# Patient Record
Sex: Male | Born: 1945 | Race: White | Hispanic: No | Smoking: Former smoker
Health system: Southern US, Community
[De-identification: ages and names within clinical notes are randomized; demographics above are authoritative.]

## PROBLEM LIST (undated history)

## (undated) DIAGNOSIS — I2510 Atherosclerotic heart disease of native coronary artery without angina pectoris: Secondary | ICD-10-CM

## (undated) DIAGNOSIS — M109 Gout, unspecified: Secondary | ICD-10-CM

## (undated) DIAGNOSIS — I4891 Unspecified atrial fibrillation: Secondary | ICD-10-CM

## (undated) DIAGNOSIS — J90 Pleural effusion, not elsewhere classified: Secondary | ICD-10-CM

## (undated) DIAGNOSIS — E7800 Pure hypercholesterolemia, unspecified: Secondary | ICD-10-CM

## (undated) DIAGNOSIS — I509 Heart failure, unspecified: Secondary | ICD-10-CM

## (undated) DIAGNOSIS — I10 Essential (primary) hypertension: Secondary | ICD-10-CM

## (undated) DIAGNOSIS — E119 Type 2 diabetes mellitus without complications: Secondary | ICD-10-CM

## (undated) HISTORY — PX: PENILE PROSTHESIS IMPLANT: SHX240

## (undated) HISTORY — PX: OTHER SURGICAL HISTORY: SHX169

## (undated) HISTORY — DX: Pleural effusion, not elsewhere classified: J90

## (undated) HISTORY — PX: CORONARY ARTERY BYPASS GRAFT: SHX141

## (undated) HISTORY — PX: COLONOSCOPY: SHX174

## (undated) HISTORY — PX: CARDIAC CATHETERIZATION: SHX172

---

## 2011-09-27 ENCOUNTER — Ambulatory Visit: Payer: Self-pay | Admitting: Unknown Physician Specialty

## 2014-01-06 ENCOUNTER — Emergency Department: Payer: Self-pay | Admitting: Emergency Medicine

## 2014-01-06 LAB — CBC
HCT: 43.3 % (ref 40.0–52.0)
HGB: 15.3 g/dL (ref 13.0–18.0)
MCH: 33.2 pg (ref 26.0–34.0)
MCHC: 35.3 g/dL (ref 32.0–36.0)
MCV: 94 fL (ref 80–100)
Platelet: 239 10*3/uL (ref 150–440)
RBC: 4.61 10*6/uL (ref 4.40–5.90)
RDW: 12.7 % (ref 11.5–14.5)
WBC: 11.4 10*3/uL — ABNORMAL HIGH (ref 3.8–10.6)

## 2014-01-06 LAB — BASIC METABOLIC PANEL
Anion Gap: 6 — ABNORMAL LOW (ref 7–16)
BUN: 16 mg/dL (ref 7–18)
CALCIUM: 9.5 mg/dL (ref 8.5–10.1)
CHLORIDE: 101 mmol/L (ref 98–107)
Co2: 28 mmol/L (ref 21–32)
Creatinine: 1.57 mg/dL — ABNORMAL HIGH (ref 0.60–1.30)
EGFR (African American): 51 — ABNORMAL LOW
EGFR (Non-African Amer.): 44 — ABNORMAL LOW
Glucose: 347 mg/dL — ABNORMAL HIGH (ref 65–99)
Osmolality: 285 (ref 275–301)
Potassium: 3.6 mmol/L (ref 3.5–5.1)
Sodium: 135 mmol/L — ABNORMAL LOW (ref 136–145)

## 2014-01-06 LAB — TROPONIN I
Troponin-I: 0.03 ng/mL
Troponin-I: 0.03 ng/mL

## 2014-06-07 DIAGNOSIS — M109 Gout, unspecified: Secondary | ICD-10-CM | POA: Insufficient documentation

## 2014-06-07 DIAGNOSIS — I4891 Unspecified atrial fibrillation: Secondary | ICD-10-CM | POA: Insufficient documentation

## 2014-06-13 DIAGNOSIS — Z951 Presence of aortocoronary bypass graft: Secondary | ICD-10-CM | POA: Insufficient documentation

## 2014-10-14 DIAGNOSIS — E1129 Type 2 diabetes mellitus with other diabetic kidney complication: Secondary | ICD-10-CM | POA: Insufficient documentation

## 2014-10-14 DIAGNOSIS — E119 Type 2 diabetes mellitus without complications: Secondary | ICD-10-CM | POA: Insufficient documentation

## 2014-10-14 DIAGNOSIS — I10 Essential (primary) hypertension: Secondary | ICD-10-CM | POA: Insufficient documentation

## 2015-01-08 DIAGNOSIS — Z860101 Personal history of adenomatous and serrated colon polyps: Secondary | ICD-10-CM | POA: Insufficient documentation

## 2015-01-08 DIAGNOSIS — Z86010 Personal history of colonic polyps: Secondary | ICD-10-CM | POA: Insufficient documentation

## 2015-01-31 ENCOUNTER — Ambulatory Visit: Payer: Self-pay | Admitting: Unknown Physician Specialty

## 2015-02-12 DIAGNOSIS — E785 Hyperlipidemia, unspecified: Secondary | ICD-10-CM

## 2015-02-12 DIAGNOSIS — E1169 Type 2 diabetes mellitus with other specified complication: Secondary | ICD-10-CM | POA: Insufficient documentation

## 2015-02-12 DIAGNOSIS — R202 Paresthesia of skin: Secondary | ICD-10-CM | POA: Insufficient documentation

## 2015-03-24 LAB — SURGICAL PATHOLOGY

## 2015-10-21 DIAGNOSIS — I2510 Atherosclerotic heart disease of native coronary artery without angina pectoris: Secondary | ICD-10-CM | POA: Insufficient documentation

## 2016-02-24 ENCOUNTER — Ambulatory Visit (INDEPENDENT_AMBULATORY_CARE_PROVIDER_SITE_OTHER): Payer: Medicare Other | Admitting: Sports Medicine

## 2016-02-24 ENCOUNTER — Ambulatory Visit: Payer: Medicare Other

## 2016-02-24 ENCOUNTER — Encounter: Payer: Self-pay | Admitting: Sports Medicine

## 2016-02-24 DIAGNOSIS — R234 Changes in skin texture: Secondary | ICD-10-CM

## 2016-02-24 DIAGNOSIS — E1142 Type 2 diabetes mellitus with diabetic polyneuropathy: Secondary | ICD-10-CM

## 2016-02-24 DIAGNOSIS — M79672 Pain in left foot: Secondary | ICD-10-CM

## 2016-02-24 DIAGNOSIS — L989 Disorder of the skin and subcutaneous tissue, unspecified: Secondary | ICD-10-CM

## 2016-02-24 NOTE — Progress Notes (Signed)
Patient ID: Jeremiah Chapman, male   DOB: 03-14-1946, 71 y.o.   MRN: [id number] Subjective: Jeremiah Chapman is a 70 y.o. male patient with history of diabetes who presents to office today complaining of dry skin to both heels; report on left the area has opened up and is painful. Patient states that the glucose reading this morning was "good". Patient denies any new changes in medication or new problems. Patient denies any new cramping, numbness, burning or tingling in the legs. Has gabapentin for his nerve pain.  Patient Active Problem List   Diagnosis Date Noted  . Arteriosclerosis of coronary artery 10/21/2015  . HLD (hyperlipidemia) 02/12/2015  . Burning sensation of the foot 02/12/2015  . H/O adenomatous polyp of colon 01/08/2015  . Essential (primary) hypertension 10/14/2014  . Controlled type 2 diabetes mellitus without complication (HCC) 10/14/2014  . H/O coronary artery bypass surgery 06/13/2014  . Atrial fibrillation (HCC) 06/07/2014  . Gout 06/07/2014   No current outpatient prescriptions on file prior to visit.   No current facility-administered medications on file prior to visit.   Allergies  Allergen Reactions  . Sitagliptin Other (See Comments)    No results found for this or any previous visit (from the past 2160 hour(s)).  Objective: General: Patient is awake, alert, and oriented x 3 and in no acute distress.  Integument: Skin is warm, dry and supple bilateral. Nails are short and asymptomatic with dystrophy with subungual debris, consistent with onychomycosis, 1-5 bilateral. There is significant dry skin and fissuring with preulcerative area and bleeding at left lateral heel. No signs of infection. Remaining integument unremarkable.  Vasculature:  Dorsalis Pedis pulse 2/4 bilateral. Posterior Tibial pulse  1/4 bilateral.  Capillary fill time <3 sec 1-5 bilateral. Scant hair growth to the level of the digits. Temperature gradient within normal limits. No  varicosities present bilateral. No edema present bilateral.   Neurology: The patient has slightly diminished sensation measured with a 5.07/10g Semmes Weinstein Monofilament at all pedal sites bilateral. Vibratory sensation diminished bilateral with tuning fork. No Babinski sign present bilateral.   Musculoskeletal: Mild tenderness to palpation to left lateral heel fissure area. No gross pedal deformities noted bilateral. Muscular strength 5/5 in all lower extremity muscular groups bilateral without pain on range of motion . No tenderness with calf compression bilateral.  Xrays, Left foot- Normal osseous mineralization, inferior calcaneal spur, no foreign body, no acute bony destruction, no other acute findings.  Assessment and Plan: Problem List Items Addressed This Visit    None    Visit Diagnoses    Left foot pain    -  Primary    Relevant Orders    DG Foot Complete Left    Fissure in skin of foot        Left heel with preulcerative area    Diabetic polyneuropathy associated with type 2 diabetes mellitus (HCC)        Relevant Medications    amLODipine-olmesartan (AZOR) 5-40 MG tablet    atorvastatin (LIPITOR) 20 MG tablet    gabapentin (NEURONTIN) 100 MG capsule    gabapentin (NEURONTIN) 100 MG capsule    glyBURIDE-metformin (GLUCOVANCE) 5-500 MG tablet    linagliptin (TRADJENTA) 5 MG TABS tablet    pioglitazone (ACTOS) 45 MG tablet    pioglitazone (ACTOS) 30 MG tablet      -Examined patient. -Xrays reviewed -Discussed and educated patient on diabetic foot care, especially with  regards to the vascular, neurological and musculoskeletal systems.  -Stressed the importance  of good glycemic control and the detriment of not  controlling glucose levels in relation to the foot. -Mechanically debrided callus/fissured skin at left lateral heel using sterile chisel blade and applied topical antibiotic cream covered with bandaid and offloading pad. Advised patient to do the same at home  daily until improves. Patient may also soak with Epsom salt. Advised patient to monitor site closely if signs of infection is present to return to office or go to ER immediately -Advised patient to refrain from shoes that irritate or rub the area -Encouraged heel offloading in bed to prevent further break down -Encouraged daily skin emollients; Okeeffe's healthy feet to all other dry skin areas -Answered all patient questions -Patient to return as needed or in 1 month for at risk foot care -Patient advised to call the office if any problems or questions arise in the meantime.  Asencion Islamitorya Jewelia Bocchino, DPM

## 2016-02-24 NOTE — Patient Instructions (Addendum)
Diabetes and Foot Care Diabetes may cause you to have problems because of poor blood supply (circulation) to your feet and legs. This may cause the skin on your feet to become thinner, break easier, and heal more slowly. Your skin may become dry, and the skin may peel and crack. You may also have nerve damage in your legs and feet causing decreased feeling in them. You may not notice minor injuries to your feet that could lead to infections or more serious problems. Taking care of your feet is one of the most important things you can do for yourself.  HOME CARE INSTRUCTIONS  Wear shoes at all times, even in the house. Do not go barefoot. Bare feet are easily injured.  Check your feet daily for blisters, cuts, and redness. If you cannot see the bottom of your feet, use a mirror or ask someone for help.  Wash your feet with warm water (do not use hot water) and mild soap. Then pat your feet and the areas between your toes until they are completely dry. Do not soak your feet as this can dry your skin.  Apply a moisturizing lotion or petroleum jelly (that does not contain alcohol and is unscented) to the skin on your feet and to dry, brittle toenails. Do not apply lotion between your toes.  Trim your toenails straight across. Do not dig under them or around the cuticle. File the edges of your nails with an emery board or nail file.  Do not cut corns or calluses or try to remove them with medicine.  Wear clean socks or stockings every day. Make sure they are not too tight. Do not wear knee-high stockings since they may decrease blood flow to your legs.  Wear shoes that fit properly and have enough cushioning. To break in new shoes, wear them for just a few hours a day. This prevents you from injuring your feet. Always look in your shoes before you put them on to be sure there are no objects inside.  Do not cross your legs. This may decrease the blood flow to your feet.  If you find a minor scrape,  cut, or break in the skin on your feet, keep it and the skin around it clean and dry. These areas may be cleansed with mild soap and water. Do not cleanse the area with peroxide, alcohol, or iodine.  When you remove an adhesive bandage, be sure not to damage the skin around it.  If you have a wound, look at it several times a day to make sure it is healing.  Do not use heating pads or hot water bottles. They may burn your skin. If you have lost feeling in your feet or legs, you may not know it is happening until it is too late.  Make sure your health care provider performs a complete foot exam at least annually or more often if you have foot problems. Report any cuts, sores, or bruises to your health care provider immediately. SEEK MEDICAL CARE IF:   You have an injury that is not healing.  You have cuts or breaks in the skin.  You have an ingrown nail.  You notice redness on your legs or feet.  You feel burning or tingling in your legs or feet.  You have pain or cramps in your legs and feet.  Your legs or feet are numb.  Your feet always feel cold. SEEK IMMEDIATE MEDICAL CARE IF:   There is increasing redness,   swelling, or pain in or around a wound.  There is a red line that goes up your leg.  Pus is coming from a wound.  You develop a fever or as directed by your health care provider.  You notice a bad smell coming from an ulcer or wound.   This information is not intended to replace advice given to you by your health care provider. Make sure you discuss any questions you have with your health care provider.   Document Released: 11/12/2000 Document Revised: 07/18/2013 Document Reviewed: 04/24/2013 Elsevier Interactive Patient Education 2016 [employer]   Recommend Epsom salt soaks and neosporin and bandaid to left heel  May use bag balm or Okeeffee's healthy feet for dry skin at heels

## 2016-03-23 ENCOUNTER — Ambulatory Visit: Payer: Medicare Other | Admitting: Sports Medicine

## 2016-03-26 ENCOUNTER — Ambulatory Visit (INDEPENDENT_AMBULATORY_CARE_PROVIDER_SITE_OTHER): Payer: Medicare Other | Admitting: Sports Medicine

## 2016-03-26 ENCOUNTER — Encounter: Payer: Self-pay | Admitting: Sports Medicine

## 2016-03-26 DIAGNOSIS — M79672 Pain in left foot: Secondary | ICD-10-CM

## 2016-03-26 DIAGNOSIS — L989 Disorder of the skin and subcutaneous tissue, unspecified: Secondary | ICD-10-CM | POA: Diagnosis not present

## 2016-03-26 DIAGNOSIS — R234 Changes in skin texture: Secondary | ICD-10-CM

## 2016-03-26 DIAGNOSIS — E1142 Type 2 diabetes mellitus with diabetic polyneuropathy: Secondary | ICD-10-CM | POA: Diagnosis not present

## 2016-03-26 NOTE — Progress Notes (Signed)
Patient ID: SARGENT MANKEY, male   DOB: 1946-08-29, 70 y.o.   MRN: [id number]  Subjective: Jeremiah Chapman is a 70 y.o. male patient with history of diabetes who returns to office today for f/u eval left heel fissure; states that if feels much better. States his glucose reading this morning was "good". Patient denies any new changes in medication or new problems. Patient denies any new cramping, numbness, burning or tingling in the legs.  Patient Active Problem List   Diagnosis Date Noted  . Arteriosclerosis of coronary artery 10/21/2015  . HLD (hyperlipidemia) 02/12/2015  . Burning sensation of the foot 02/12/2015  . H/O adenomatous polyp of colon 01/08/2015  . Essential (primary) hypertension 10/14/2014  . Controlled type 2 diabetes mellitus without complication (HCC) 10/14/2014  . H/O coronary artery bypass surgery 06/13/2014  . Atrial fibrillation (HCC) 06/07/2014  . Gout 06/07/2014   Current Outpatient Prescriptions on File Prior to Visit  Medication Sig Dispense Refill  . amLODipine-olmesartan (AZOR) 5-40 MG tablet   1  . apixaban (ELIQUIS) 2.5 MG TABS tablet Take by mouth.    Marland Kitchen atorvastatin (LIPITOR) 20 MG tablet     . dronedarone (MULTAQ) 400 MG tablet Take by mouth.    Everlene Balls 5 MG TABS tablet     . gabapentin (NEURONTIN) 100 MG capsule Take by mouth.    . gabapentin (NEURONTIN) 100 MG capsule   0  . glyBURIDE-metformin (GLUCOVANCE) 5-500 MG tablet Take by mouth.    Marland Kitchen HYDROcodone-acetaminophen (NORCO/VICODIN) 5-325 MG tablet     . Influenza Vac Split High-Dose (FLUZONE HIGH-DOSE) 0.5 ML SUSY     . linagliptin (TRADJENTA) 5 MG TABS tablet Take by mouth.    . metoprolol succinate (TOPROL-XL) 50 MG 24 hr tablet Take by mouth.    . pioglitazone (ACTOS) 30 MG tablet   0  . pioglitazone (ACTOS) 45 MG tablet Take by mouth.    . pneumococcal 13-valent conjugate vaccine (PREVNAR 13) SUSP injection     . tretinoin (ALTRALIN) 0.05 % gel      No current facility-administered  medications on file prior to visit.   Allergies  Allergen Reactions  . Sitagliptin Other (See Comments)    No results found for this or any previous visit (from the past 2160 hour(s)).  Objective: General: Patient is awake, alert, and oriented x 3 and in no acute distress.  Integument: Skin is warm, dry and supple bilateral. Nails are short and asymptomatic with dystrophy with subungual debris, consistent with onychomycosis, 1-5 bilateral. There is significant dry skin and fissuring with preulcerative area at left lateral heel, that is improving in nature. No signs of infection. Remaining integument unremarkable.  Vasculature:  Dorsalis Pedis pulse 2/4 bilateral. Posterior Tibial pulse  1/4 bilateral.  Capillary fill time <3 sec 1-5 bilateral. Scant hair growth to the level of the digits. Temperature gradient within normal limits. No varicosities present bilateral. No edema present bilateral.   Neurology: The patient has slightly diminished sensation measured with a 5.07/10g Semmes Weinstein Monofilament at all pedal sites bilateral. Vibratory sensation diminished bilateral with tuning fork. No Babinski sign present bilateral.   Musculoskeletal: No tenderness to palpation to left lateral heel fissure area. No gross pedal deformities noted bilateral. Muscular strength 5/5 in all lower extremity muscular groups bilateral without pain on range of motion . No tenderness with calf compression bilateral.  Assessment and Plan: Problem List Items Addressed This Visit    None    Visit Diagnoses  Left foot pain    -  Primary    Fissure in skin of foot        Left lateral heel, improving    Diabetic polyneuropathy associated with type 2 diabetes mellitus (HCC)          -Examined patient. -Discussed and educated patient on diabetic foot care, especially with  regards to the vascular, neurological and musculoskeletal systems.  -Stressed the importance of good glycemic control and the  detriment of not  controlling glucose levels in relation to the foot. -Mechanically debrided fissured skin at left lateral heel using sterile chisel blade and applied topical antibiotic cream. Advised patient to do the same at home daily until completely resolved. Advised patient to monitor site closely if signs of infection is present to return to office or go to ER immediately -Advised patient to cont to refrain from shoes that irritate or rub the area -Encouraged heel offloading in bed to prevent further break down as previous -Encouraged daily skin emollients; Cont with Okeeffe's healthy feet to all other dry skin areas -Answered all patient questions -Patient to return as needed or in 1 month for at risk foot care will re-eval fissure and trim nails at next visit. -Patient advised to call the office if any problems or questions arise in the meantime.  Asencion Islamitorya Briceida Rasberry, DPM

## 2016-04-15 DIAGNOSIS — R0602 Shortness of breath: Secondary | ICD-10-CM | POA: Insufficient documentation

## 2016-05-04 ENCOUNTER — Ambulatory Visit (INDEPENDENT_AMBULATORY_CARE_PROVIDER_SITE_OTHER): Payer: Medicare Other | Admitting: Sports Medicine

## 2016-05-04 ENCOUNTER — Encounter: Payer: Self-pay | Admitting: Sports Medicine

## 2016-05-04 DIAGNOSIS — E1142 Type 2 diabetes mellitus with diabetic polyneuropathy: Secondary | ICD-10-CM | POA: Diagnosis not present

## 2016-05-04 DIAGNOSIS — M79671 Pain in right foot: Secondary | ICD-10-CM | POA: Diagnosis not present

## 2016-05-04 DIAGNOSIS — L989 Disorder of the skin and subcutaneous tissue, unspecified: Secondary | ICD-10-CM

## 2016-05-04 DIAGNOSIS — I739 Peripheral vascular disease, unspecified: Secondary | ICD-10-CM

## 2016-05-04 DIAGNOSIS — M79672 Pain in left foot: Secondary | ICD-10-CM

## 2016-05-04 DIAGNOSIS — B351 Tinea unguium: Secondary | ICD-10-CM | POA: Diagnosis not present

## 2016-05-04 DIAGNOSIS — R234 Changes in skin texture: Secondary | ICD-10-CM

## 2016-05-04 NOTE — Progress Notes (Signed)
Patient ID: Jeremiah MoralesKenneth D Chapman, male   DOB: 05/22/1946, 70 y.o.   MRN: [id number]  Subjective: Jeremiah MoralesKenneth D Chapman is a 70 y.o. male patient with history of diabetes who returns to office today for nail care and f/u eval heel fissure; states that if feels much better, continues with mositurizing creams with no problems. States his glucose reading this morning was "good". Patient denies any new changes in medication or new problems. Patient denies any new cramping, numbness, burning or tingling in the legs.  Patient Active Problem List   Diagnosis Date Noted  . Breathlessness on exertion 04/15/2016  . Arteriosclerosis of coronary artery 10/21/2015  . HLD (hyperlipidemia) 02/12/2015  . Burning sensation of the foot 02/12/2015  . H/O adenomatous polyp of colon 01/08/2015  . Essential (primary) hypertension 10/14/2014  . Controlled type 2 diabetes mellitus without complication (HCC) 10/14/2014  . H/O coronary artery bypass surgery 06/13/2014  . Atrial fibrillation (HCC) 06/07/2014  . Gout 06/07/2014   Current Outpatient Prescriptions on File Prior to Visit  Medication Sig Dispense Refill  . amLODipine-olmesartan (AZOR) 5-40 MG tablet   1  . apixaban (ELIQUIS) 2.5 MG TABS tablet Take by mouth.    Marland Kitchen. atorvastatin (LIPITOR) 20 MG tablet     . dronedarone (MULTAQ) 400 MG tablet Take by mouth.    Everlene Balls. ELIQUIS 5 MG TABS tablet     . gabapentin (NEURONTIN) 100 MG capsule Take by mouth.    . gabapentin (NEURONTIN) 100 MG capsule   0  . glyBURIDE-metformin (GLUCOVANCE) 5-500 MG tablet Take by mouth.    Marland Kitchen. HYDROcodone-acetaminophen (NORCO/VICODIN) 5-325 MG tablet     . Influenza Vac Split High-Dose (FLUZONE HIGH-DOSE) 0.5 ML SUSY     . linagliptin (TRADJENTA) 5 MG TABS tablet Take by mouth.    . metoprolol succinate (TOPROL-XL) 50 MG 24 hr tablet Take by mouth.    . pioglitazone (ACTOS) 30 MG tablet   0  . pioglitazone (ACTOS) 45 MG tablet Take by mouth.    . pneumococcal 13-valent conjugate vaccine (PREVNAR  13) SUSP injection     . tretinoin (ALTRALIN) 0.05 % gel      No current facility-administered medications on file prior to visit.   Allergies  Allergen Reactions  . Sitagliptin Other (See Comments)    No results found for this or any previous visit (from the past 2160 hour(s)).  Objective: General: Patient is awake, alert, and oriented x 3 and in no acute distress.  Integument: Skin is warm, dry and supple bilateral. Nails are tender and elongated with dystrophy with subungual debris, consistent with onychomycosis, 1-5 bilateral. There isdry skin and fissuring with preulcerative area at left>right lateral heel, that is improved in nature. No signs of infection. Remaining integument unremarkable.  Vasculature:  Dorsalis Pedis pulse 2/4 bilateral. Posterior Tibial pulse  1/4 bilateral.  Capillary fill time <3 sec 1-5 bilateral. Scant hair growth to the level of the digits. Temperature gradient within normal limits. + varicosities present bilateral. No edema present bilateral.   Neurology: The patient has slightly diminished sensation measured with a 5.07/10g Semmes Weinstein Monofilament at all pedal sites bilateral. Vibratory sensation diminished bilateral with tuning fork. No Babinski sign present bilateral.   Musculoskeletal: No tenderness to palpation to left lateral heel fissure area. No gross pedal deformities noted bilateral. Muscular strength 5/5 in all lower extremity muscular groups bilateral without pain on range of motion . No tenderness with calf compression bilateral.  Assessment and Plan: Problem List Items Addressed This  Visit    None    Visit Diagnoses    Dermatophytosis of nail    -  Primary    Diabetic polyneuropathy associated with type 2 diabetes mellitus (HCC)        Left foot pain        Right foot pain        Fissure in skin of foot        PVD (peripheral vascular disease) (HCC)          -Examined patient. -Discussed and educated patient on diabetic foot  care, especially with  regards to the vascular, neurological and musculoskeletal systems.  -Stressed the importance of good glycemic control and the detriment of not  controlling glucose levels in relation to the foot. -Mechanically debrided nails x 10 using sterile nail nipper without incident -Advised patient to cont to refrain from shoes that irritate or rub the area of heels -Encouraged daily skin emollients; Cont with Okeeffe's healthy feet to all other dry skin areas -Answered all patient questions -Patient to return in 10 weeks for at risk foot care/nail care -Patient advised to call the office if any problems or questions arise in the meantime.  Asencion Islam, DPM

## 2016-07-13 ENCOUNTER — Encounter: Payer: Self-pay | Admitting: Sports Medicine

## 2016-07-13 ENCOUNTER — Ambulatory Visit (INDEPENDENT_AMBULATORY_CARE_PROVIDER_SITE_OTHER): Payer: Medicare Other | Admitting: Sports Medicine

## 2016-07-13 DIAGNOSIS — B351 Tinea unguium: Secondary | ICD-10-CM | POA: Diagnosis not present

## 2016-07-13 DIAGNOSIS — E1142 Type 2 diabetes mellitus with diabetic polyneuropathy: Secondary | ICD-10-CM

## 2016-07-13 DIAGNOSIS — M79671 Pain in right foot: Secondary | ICD-10-CM | POA: Diagnosis not present

## 2016-07-13 DIAGNOSIS — B359 Dermatophytosis, unspecified: Secondary | ICD-10-CM

## 2016-07-13 DIAGNOSIS — M79672 Pain in left foot: Secondary | ICD-10-CM

## 2016-07-13 NOTE — Progress Notes (Signed)
Patient ID: Jeremiah MoralesKenneth D Chapman, male   DOB: 09/22/1946, 70 y.o.   MRN: [id number]  Subjective: Jeremiah MoralesKenneth D Chapman is a 70 y.o. male patient with history of diabetes who returns to office today for nail care. Admits to med change started on insulin injection. States his glucose reading this morning was "good". Patient denies any new changes in medication or other new problems. Patient denies any new cramping, numbness, burning or tingling in the legs.  Patient Active Problem List   Diagnosis Date Noted  . Breathlessness on exertion 04/15/2016  . Arteriosclerosis of coronary artery 10/21/2015  . HLD (hyperlipidemia) 02/12/2015  . Burning sensation of the foot 02/12/2015  . H/O adenomatous polyp of colon 01/08/2015  . Essential (primary) hypertension 10/14/2014  . Controlled type 2 diabetes mellitus without complication (HCC) 10/14/2014  . H/O coronary artery bypass surgery 06/13/2014  . Atrial fibrillation (HCC) 06/07/2014  . Gout 06/07/2014   Current Outpatient Prescriptions on File Prior to Visit  Medication Sig Dispense Refill  . amLODipine-olmesartan (AZOR) 5-40 MG tablet   1  . apixaban (ELIQUIS) 2.5 MG TABS tablet Take by mouth.    Marland Kitchen. atorvastatin (LIPITOR) 20 MG tablet     . dronedarone (MULTAQ) 400 MG tablet Take by mouth.    Everlene Balls. ELIQUIS 5 MG TABS tablet     . gabapentin (NEURONTIN) 100 MG capsule Take by mouth.    . gabapentin (NEURONTIN) 100 MG capsule   0  . glyBURIDE-metformin (GLUCOVANCE) 5-500 MG tablet Take by mouth.    Marland Kitchen. HYDROcodone-acetaminophen (NORCO/VICODIN) 5-325 MG tablet     . Influenza Vac Split High-Dose (FLUZONE HIGH-DOSE) 0.5 ML SUSY     . linagliptin (TRADJENTA) 5 MG TABS tablet Take by mouth.    . metoprolol succinate (TOPROL-XL) 50 MG 24 hr tablet Take by mouth.    . pioglitazone (ACTOS) 30 MG tablet   0  . pioglitazone (ACTOS) 45 MG tablet Take by mouth.    . pneumococcal 13-valent conjugate vaccine (PREVNAR 13) SUSP injection     . tretinoin (ALTRALIN) 0.05 %  gel      No current facility-administered medications on file prior to visit.    Allergies  Allergen Reactions  . Sitagliptin Other (See Comments)    No results found for this or any previous visit (from the past 2160 hour(s)).  Objective: General: Patient is awake, alert, and oriented x 3 and in no acute distress.  Integument: Skin is warm, dry and supple bilateral. Nails are tender and elongated with dystrophy with subungual debris, consistent with onychomycosis, 1-5 bilateral. There is dry skin and fissuring with preulcerative area at left>right lateral heel, that is improved in nature. No signs of infection. Mild interdigital maceration bilateral. No local acute infection to webspaces. Remaining integument unremarkable.  Vasculature:  Dorsalis Pedis pulse 2/4 bilateral. Posterior Tibial pulse  1/4 bilateral. Capillary fill time <3 sec 1-5 bilateral. Scant hair growth to the level of the digits.Temperature gradient within normal limits. + varicosities present bilateral. No edema present bilateral.   Neurology: The patient has slightly diminished sensation measured with a 5.07/10g Semmes Weinstein Monofilament at all pedal sites bilateral. Vibratory sensation diminished bilateral with tuning fork. No Babinski sign present bilateral.   Musculoskeletal: No tenderness to palpation to heel fissure areas. No gross pedal deformities noted bilateral. Muscular strength 5/5 in all lower extremity muscular groups bilateral without pain on range of motion . No tenderness with calf compression bilateral.  Assessment and Plan: Problem List Items Addressed This Visit  None    Visit Diagnoses    Dermatophytosis of nail    -  Primary   Tinea       Diabetic polyneuropathy associated with type 2 diabetes mellitus (HCC)       Foot pain, bilateral         -Examined patient. -Discussed and educated patient on diabetic foot care, especially with  regards to the vascular, neurological and  musculoskeletal systems.  -Stressed the importance of good glycemic control and the detriment of not controlling glucose levels in relation to the foot. -Mechanically debrided nails x 10 using sterile nail nipper without incident -Advised patient to cont to refrain from shoes that irritate or rub the area of heels -Encouraged daily skin emollients; Cont with Okeeffe's healthy feet to all other dry skin areas -Recommend to dry well in between toes; applied castanelli's paint to areas of tinea. If fail to improve will prescribe clotrimazole solution to use daily in between toes -Answered all patient questions -Patient to return in 10 weeks for at risk foot care/nail care -Patient advised to call the office if any problems or questions arise in the meantime.  Asencion Islamitorya Berdina Cheever, DPM

## 2016-09-21 ENCOUNTER — Ambulatory Visit (INDEPENDENT_AMBULATORY_CARE_PROVIDER_SITE_OTHER): Payer: Medicare Other | Admitting: Podiatry

## 2016-09-21 ENCOUNTER — Encounter: Payer: Self-pay | Admitting: Podiatry

## 2016-09-21 DIAGNOSIS — L603 Nail dystrophy: Secondary | ICD-10-CM

## 2016-09-21 DIAGNOSIS — M79609 Pain in unspecified limb: Principal | ICD-10-CM

## 2016-09-21 DIAGNOSIS — L608 Other nail disorders: Secondary | ICD-10-CM

## 2016-09-21 DIAGNOSIS — B351 Tinea unguium: Secondary | ICD-10-CM

## 2016-09-21 DIAGNOSIS — M79676 Pain in unspecified toe(s): Secondary | ICD-10-CM | POA: Diagnosis not present

## 2016-09-21 DIAGNOSIS — E0843 Diabetes mellitus due to underlying condition with diabetic autonomic (poly)neuropathy: Secondary | ICD-10-CM

## 2016-09-21 NOTE — Progress Notes (Signed)
SUBJECTIVE Patient with a history of diabetes mellitus presents to office today complaining of elongated, thickened nails. Pain while ambulating in shoes. Patient is unable to trim their own nails.   Allergies  Allergen Reactions  . Sitagliptin Other (See Comments)    OBJECTIVE General Patient is awake, alert, and oriented x 3 and in no acute distress. Derm Skin is dry and supple bilateral. Negative open lesions or macerations. Remaining integument unremarkable. Nails are tender, long, thickened and dystrophic with subungual debris, consistent with onychomycosis, 1-5 bilateral. No signs of infection noted. Vasc  DP and PT pedal pulses palpable bilaterally. Temperature gradient within normal limits.  Neuro Epicritic and protective threshold sensation diminished bilaterally.  Musculoskeletal Exam No symptomatic pedal deformities noted bilateral. Muscular strength within normal limits.  ASSESSMENT 1. Diabetes Mellitus w/ peripheral neuropathy 2. Onychomycosis of nail due to dermatophyte bilateral 3. Pain in foot bilateral  PLAN OF CARE 1. Patient evaluated today. 2. Instructed to maintain good pedal hygiene and foot care. Stressed importance of controlling blood sugar.  3. Mechanical debridement of nails 1-5 bilaterally performed using a nail nipper. Filed with dremel without incident.  4. Return to clinic in 3 mos.    Felecia ShellingBrent M Artem Bunte, DPM

## 2016-12-27 ENCOUNTER — Ambulatory Visit (INDEPENDENT_AMBULATORY_CARE_PROVIDER_SITE_OTHER): Payer: Medicare Other | Admitting: Podiatry

## 2016-12-27 ENCOUNTER — Encounter: Payer: Self-pay | Admitting: Podiatry

## 2016-12-27 DIAGNOSIS — B351 Tinea unguium: Secondary | ICD-10-CM

## 2016-12-27 DIAGNOSIS — M2010 Hallux valgus (acquired), unspecified foot: Secondary | ICD-10-CM

## 2016-12-27 DIAGNOSIS — E0843 Diabetes mellitus due to underlying condition with diabetic autonomic (poly)neuropathy: Secondary | ICD-10-CM

## 2016-12-27 DIAGNOSIS — M79609 Pain in unspecified limb: Secondary | ICD-10-CM

## 2016-12-27 NOTE — Progress Notes (Addendum)
Complaint:  Visit Type: Patient returns to my office for continued preventative foot care services. Complaint: Patient states" my nails have grown long and thick and become painful to walk and wear shoes" Patient has been diagnosed with DM with no foot complications. The patient presents for preventative foot care services. No changes to ROS  Podiatric Exam: Vascular: dorsalis pedis and posterior tibial pulses are palpable bilateral. Capillary return is immediate. Temperature gradient is WNL. Skin turgor WNL  Sensorium: Diminished  Semmes Weinstein monofilament test. Normal tactile sensation bilaterally. Nail Exam: Pt has thick disfigured discolored nails with subungual debris noted bilateral entire nail hallux through fifth toenails Ulcer Exam: There is no evidence of ulcer or pre-ulcerative changes or infection. Orthopedic Exam: Muscle tone and strength are WNL. No limitations in general ROM. No crepitus or effusions noted. Foot type and digits show no abnormalities. HAV   B/L Skin: No Porokeratosis. No infection or ulcers  Diagnosis:  Onychomycosis, , Pain in right toe, pain in left toes  Treatment & Plan Procedures and Treatment: Consent by patient was obtained for treatment procedures. The patient understood the discussion of treatment and procedures well. All questions were answered thoroughly reviewed. Debridement of mycotic and hypertrophic toenails, 1 through 5 bilateral and clearing of subungual debris. No ulceration, no infection noted. Patient to think about diabetic shoes. Return Visit-Office Procedure: Patient instructed to return to the office for a follow up visit 3 months for continued evaluation and treatment.    Helane GuntherGregory Carlene Bickley DPM

## 2016-12-28 ENCOUNTER — Ambulatory Visit: Payer: Medicare Other | Admitting: Podiatry

## 2017-01-20 ENCOUNTER — Encounter: Payer: Self-pay | Admitting: *Deleted

## 2017-01-20 ENCOUNTER — Ambulatory Visit
Admission: RE | Admit: 2017-01-20 | Discharge: 2017-01-20 | Disposition: A | Discharge status: Home / Self Care | Payer: Medicare Other | Source: Ambulatory Visit | Attending: Cardiology | Admitting: Cardiology

## 2017-01-20 ENCOUNTER — Ambulatory Visit: Payer: Medicare Other | Admitting: Anesthesiology

## 2017-01-20 ENCOUNTER — Encounter
Admission: RE | Disposition: A | Discharge status: Home / Self Care | Payer: Self-pay | Source: Ambulatory Visit | Attending: Cardiology

## 2017-01-20 DIAGNOSIS — Z6830 Body mass index (BMI) 30.0-30.9, adult: Secondary | ICD-10-CM | POA: Diagnosis not present

## 2017-01-20 DIAGNOSIS — M109 Gout, unspecified: Secondary | ICD-10-CM | POA: Insufficient documentation

## 2017-01-20 DIAGNOSIS — E669 Obesity, unspecified: Secondary | ICD-10-CM | POA: Insufficient documentation

## 2017-01-20 DIAGNOSIS — J449 Chronic obstructive pulmonary disease, unspecified: Secondary | ICD-10-CM | POA: Diagnosis not present

## 2017-01-20 DIAGNOSIS — I509 Heart failure, unspecified: Secondary | ICD-10-CM | POA: Insufficient documentation

## 2017-01-20 DIAGNOSIS — F17200 Nicotine dependence, unspecified, uncomplicated: Secondary | ICD-10-CM | POA: Diagnosis not present

## 2017-01-20 DIAGNOSIS — G4730 Sleep apnea, unspecified: Secondary | ICD-10-CM | POA: Insufficient documentation

## 2017-01-20 DIAGNOSIS — I110 Hypertensive heart disease with heart failure: Secondary | ICD-10-CM | POA: Diagnosis not present

## 2017-01-20 DIAGNOSIS — I4891 Unspecified atrial fibrillation: Secondary | ICD-10-CM | POA: Insufficient documentation

## 2017-01-20 DIAGNOSIS — E119 Type 2 diabetes mellitus without complications: Secondary | ICD-10-CM | POA: Diagnosis not present

## 2017-01-20 DIAGNOSIS — E785 Hyperlipidemia, unspecified: Secondary | ICD-10-CM | POA: Diagnosis not present

## 2017-01-20 DIAGNOSIS — I2510 Atherosclerotic heart disease of native coronary artery without angina pectoris: Secondary | ICD-10-CM | POA: Diagnosis not present

## 2017-01-20 HISTORY — DX: Type 2 diabetes mellitus without complications: E11.9

## 2017-01-20 HISTORY — DX: Unspecified atrial fibrillation: I48.91

## 2017-01-20 HISTORY — DX: Pure hypercholesterolemia, unspecified: E78.00

## 2017-01-20 HISTORY — PX: CARDIOVERSION: EP1203

## 2017-01-20 HISTORY — DX: Essential (primary) hypertension: I10

## 2017-01-20 HISTORY — DX: Gout, unspecified: M10.9

## 2017-01-20 HISTORY — DX: Heart failure, unspecified: I50.9

## 2017-01-20 SURGERY — CARDIOVERSION (CATH LAB)
Anesthesia: General

## 2017-01-20 MED ORDER — SODIUM CHLORIDE 0.9 % IV SOLN
INTRAVENOUS | Status: DC
  Administered 2017-01-20: 07:00:00 via INTRAVENOUS

## 2017-01-20 MED ORDER — PROPOFOL 10 MG/ML IV BOLUS
INTRAVENOUS | Status: AC
  Filled 2017-01-20: qty 20

## 2017-01-20 MED ORDER — PROPOFOL 10 MG/ML IV BOLUS
INTRAVENOUS | Status: DC | PRN
  Administered 2017-01-20: 10 mg via INTRAVENOUS
  Administered 2017-01-20: 40 mg via INTRAVENOUS
  Administered 2017-01-20: 20 mg via INTRAVENOUS
  Administered 2017-01-20: 10 mg via INTRAVENOUS

## 2017-01-20 NOTE — Anesthesia Post-op Follow-up Note (Cosign Needed)
Anesthesia QCDR form completed.        

## 2017-01-20 NOTE — Anesthesia Preprocedure Evaluation (Addendum)
Anesthesia Evaluation  Patient identified by MRN, date of birth, ID band Patient awake    Reviewed: Allergy & Precautions, NPO status , Patient's Chart, lab work & pertinent test results  History of Anesthesia Complications Negative for: history of anesthetic complications  Airway Mallampati: IV  TM Distance: >3 FB Neck ROM: Full    Dental no notable dental hx.    Pulmonary sleep apnea , COPD, Current Smoker,    breath sounds clear to auscultation- rhonchi (-) wheezing      Cardiovascular hypertension, (-) angina+ CAD, + CABG (1998) and +CHF  + dysrhythmias Atrial Fibrillation  Rhythm:Regular Rate:Normal - Systolic murmurs and - Diastolic murmurs Echo 05/03/16: MILD LV SYSTOLIC DYSFUNCTION WITH MODERATE LVH MODERATE VALVULAR REGURGITATION (Mild MR, Mild AI, Mod PR) NO VALVULAR STENOSIS MILD TO MODERATE AI EF 45%   Neuro/Psych negative neurological ROS  negative psych ROS   GI/Hepatic negative GI ROS, Neg liver ROS,   Endo/Other  diabetes  Renal/GU Renal InsufficiencyRenal disease     Musculoskeletal negative musculoskeletal ROS (+)   Abdominal (+) + obese,   Peds  Hematology negative hematology ROS (+)   Anesthesia Other Findings Past Medical History: No date: Atrial fibrillation (HCC) No date: CHF (congestive heart failure) (HCC) No date: Diabetes mellitus without complication (HCC) No date: Gout No date: Hypercholesterolemia No date: Hypertension   Reproductive/Obstetrics                            Anesthesia Physical Anesthesia Plan  ASA: IV  Anesthesia Plan: General   Post-op Pain Management:    Induction: Intravenous  Airway Management Planned: Natural Airway  Additional Equipment:   Intra-op Plan:   Post-operative Plan:   Informed Consent: I have reviewed the patients History and Physical, chart, labs and discussed the procedure including the risks, benefits and  alternatives for the proposed anesthesia with the patient or authorized representative who has indicated his/her understanding and acceptance.   Dental advisory given  Plan Discussed with: CRNA and Anesthesiologist  Anesthesia Plan Comments:        Anesthesia Quick Evaluation

## 2017-01-20 NOTE — Anesthesia Postprocedure Evaluation (Signed)
Anesthesia Post Note  Patient: Jeremiah MoralesKenneth D Chapman  Procedure(s) Performed: Procedure(s) (LRB): CARDIOVERSION (N/A)  Patient location during evaluation: Other Anesthesia Type: General Level of consciousness: awake and alert and oriented Pain management: pain level controlled Vital Signs Assessment: post-procedure vital signs reviewed and stable Respiratory status: spontaneous breathing, nonlabored ventilation and respiratory function stable Cardiovascular status: blood pressure returned to baseline and stable Postop Assessment: no signs of nausea or vomiting Anesthetic complications: no     Last Vitals:  Vitals:   01/20/17 0702 01/20/17 0730  BP: 131/68 126/64  Pulse: 78 (!) 58  Resp: 18 (!) 24  Temp: 37 C     Last Pain: There were no vitals filed for this visit.               Cera Rorke

## 2017-01-20 NOTE — Op Note (Signed)
[facility]   01/20/2017                     7:31 AM  PATIENT:  Eliott NineKenneth D Mashek    PRE-OPERATIVE DIAGNOSIS:  Cardioversion    Afib  POST-OPERATIVE DIAGNOSIS:  Same  PROCEDURE:  CARDIOVERSION  SURGEON:  Marcina MillardAlexander Keosha Rossa, MD    ANESTHESIA:     PREOPERATIVE INDICATIONS:  Lissa MoralesKenneth D Prom is a  71 y.o. male with a diagnosis of Cardioversion    Afib who failed conservative measures and elected for surgical management.    The risks benefits and alternatives were discussed with the patient preoperatively including but not limited to the risks of infection, bleeding, cardiopulmonary complications, the need for revision surgery, among others, and the patient was willing to proceed.   OPERATIVE PROCEDURE: The patient was brought to special holding area in a fasting state. He received 80 mg propofol. Patient underwent successive shocks and 75 and 150 J with successful conversion to sinus rhythm. There were no periprocedural complications.

## 2017-01-20 NOTE — Discharge Instructions (Signed)
Electrical Cardioversion, Care After °This sheet gives you information about how to care for yourself after your procedure. Your health care provider may also give you more specific instructions. If you have problems or questions, contact your health care provider. °What can I expect after the procedure? °After the procedure, it is common to have: °· Some redness on the skin where the shocks were given. °Follow these instructions at home: °· Do not drive for 24 hours if you were given a medicine to help you relax (sedative). °· Take over-the-counter and prescription medicines only as told by your health care provider. °· Ask your health care provider how to check your pulse. Check it often. °· Rest for 48 hours after the procedure or as told by your health care provider. °· Avoid or limit your caffeine use as told by your health care provider. °Contact a health care provider if: °· You feel like your heart is beating too quickly or your pulse is not regular. °· You have a serious muscle cramp that does not go away. °Get help right away if: °· You have discomfort in your chest. °· You are dizzy or you feel faint. °· You have trouble breathing or you are short of breath. °· Your speech is slurred. °· You have trouble moving an arm or leg on one side of your body. °· Your fingers or toes turn cold or blue. °This information is not intended to replace advice given to you by your health care provider. Make sure you discuss any questions you have with your health care provider. °Document Released: 09/05/2013 Document Revised: 06/18/2016 Document Reviewed: 05/21/2016 °Elsevier Interactive Patient Education © 2017 [employer] ° °

## 2017-01-20 NOTE — Anesthesia Procedure Notes (Signed)
Date/Time: 01/20/2017 7:20 AM Performed by: Ginger CarneMICHELET, Aamani Moose Pre-anesthesia Checklist: Patient identified, Emergency Drugs available, Suction available, Patient being monitored and Timeout performed Patient Re-evaluated:Patient Re-evaluated prior to inductionOxygen Delivery Method: Nasal cannula

## 2017-01-20 NOTE — Transfer of Care (Signed)
Immediate Anesthesia Transfer of Care Note  Patient: Jeremiah MoralesKenneth D Chapman  Procedure(s) Performed: Procedure(s): CARDIOVERSION (N/A)  Patient Location: PACU  Anesthesia Type:General  Level of Consciousness: awake, alert  and oriented  Airway & Oxygen Therapy: Patient Spontanous Breathing and Patient connected to nasal cannula oxygen  Post-op Assessment: Report given to RN and Post -op Vital signs reviewed and stable  Post vital signs: Reviewed and stable  Last Vitals:  Vitals:   01/20/17 0702 01/20/17 0730  BP: 131/68 126/64  Pulse: 78 (!) 58  Resp: 18 (!) 24  Temp: 37 C     Last Pain: There were no vitals filed for this visit.       Complications: No apparent anesthesia complications

## 2017-03-21 ENCOUNTER — Ambulatory Visit (INDEPENDENT_AMBULATORY_CARE_PROVIDER_SITE_OTHER): Payer: Medicare Other | Admitting: Podiatry

## 2017-03-21 DIAGNOSIS — B351 Tinea unguium: Secondary | ICD-10-CM

## 2017-03-21 DIAGNOSIS — E1142 Type 2 diabetes mellitus with diabetic polyneuropathy: Secondary | ICD-10-CM

## 2017-03-21 DIAGNOSIS — M79609 Pain in unspecified limb: Secondary | ICD-10-CM | POA: Diagnosis not present

## 2017-03-21 DIAGNOSIS — M2010 Hallux valgus (acquired), unspecified foot: Secondary | ICD-10-CM

## 2017-03-21 NOTE — Progress Notes (Signed)
Complaint:  Visit Type: Patient returns to my office for continued preventative foot care services. Complaint: Patient states" my nails have grown long and thick and become painful to walk and wear shoes" Patient has been diagnosed with DM with no foot complications. The patient presents for preventative foot care services. No changes to ROS.    Podiatric Exam: Vascular: dorsalis pedis and posterior tibial pulses are palpable bilateral. Capillary return is immediate. Temperature gradient is WNL. Skin turgor WNL  Sensorium: Diminished  Semmes Weinstein monofilament test. Normal tactile sensation bilaterally. Nail Exam: Pt has thick disfigured discolored nails with subungual debris noted bilateral entire nail hallux through fifth toenails Ulcer Exam: There is no evidence of ulcer or pre-ulcerative changes or infection. Orthopedic Exam: Muscle tone and strength are WNL. No limitations in general ROM. No crepitus or effusions noted. Foot type and digits show no abnormalities. HAV  B/L Skin: No Porokeratosis. No infection or ulcers  Diagnosis:  Onychomycosis, , Pain in right toe, pain in left toes  Treatment & Plan Procedures and Treatment: Consent by patient was obtained for treatment procedures. The patient understood the discussion of treatment and procedures well. All questions were answered thoroughly reviewed. Debridement of mycotic and hypertrophic toenails, 1 through 5 bilateral and clearing of subungual debris. No ulceration, no infection noted.   Return Visit-Office Procedure: Patient instructed to return to the office for a follow up visit 3 months for continued evaluation and treatment.    Mehar Sagen DPM 

## 2017-03-26 ENCOUNTER — Encounter: Payer: Self-pay | Admitting: Internal Medicine

## 2017-03-26 ENCOUNTER — Inpatient Hospital Stay
Admission: EM | Admit: 2017-03-26 | Discharge: 2017-03-29 | DRG: 246 | Disposition: A | Discharge status: Home / Self Care | Payer: Medicare Other | Attending: Internal Medicine | Admitting: Internal Medicine

## 2017-03-26 ENCOUNTER — Emergency Department: Payer: Medicare Other

## 2017-03-26 DIAGNOSIS — Z7901 Long term (current) use of anticoagulants: Secondary | ICD-10-CM | POA: Diagnosis not present

## 2017-03-26 DIAGNOSIS — Z79899 Other long term (current) drug therapy: Secondary | ICD-10-CM | POA: Diagnosis not present

## 2017-03-26 DIAGNOSIS — Z951 Presence of aortocoronary bypass graft: Secondary | ICD-10-CM

## 2017-03-26 DIAGNOSIS — N179 Acute kidney failure, unspecified: Secondary | ICD-10-CM | POA: Diagnosis present

## 2017-03-26 DIAGNOSIS — Z7984 Long term (current) use of oral hypoglycemic drugs: Secondary | ICD-10-CM

## 2017-03-26 DIAGNOSIS — Z7951 Long term (current) use of inhaled steroids: Secondary | ICD-10-CM

## 2017-03-26 DIAGNOSIS — F17210 Nicotine dependence, cigarettes, uncomplicated: Secondary | ICD-10-CM | POA: Diagnosis present

## 2017-03-26 DIAGNOSIS — R0600 Dyspnea, unspecified: Secondary | ICD-10-CM

## 2017-03-26 DIAGNOSIS — E7800 Pure hypercholesterolemia, unspecified: Secondary | ICD-10-CM | POA: Diagnosis present

## 2017-03-26 DIAGNOSIS — Z888 Allergy status to other drugs, medicaments and biological substances status: Secondary | ICD-10-CM

## 2017-03-26 DIAGNOSIS — M109 Gout, unspecified: Secondary | ICD-10-CM | POA: Diagnosis present

## 2017-03-26 DIAGNOSIS — J449 Chronic obstructive pulmonary disease, unspecified: Secondary | ICD-10-CM | POA: Diagnosis present

## 2017-03-26 DIAGNOSIS — I509 Heart failure, unspecified: Secondary | ICD-10-CM

## 2017-03-26 DIAGNOSIS — R0602 Shortness of breath: Secondary | ICD-10-CM | POA: Diagnosis present

## 2017-03-26 DIAGNOSIS — J9601 Acute respiratory failure with hypoxia: Secondary | ICD-10-CM | POA: Diagnosis not present

## 2017-03-26 DIAGNOSIS — R7989 Other specified abnormal findings of blood chemistry: Secondary | ICD-10-CM

## 2017-03-26 DIAGNOSIS — R001 Bradycardia, unspecified: Secondary | ICD-10-CM | POA: Diagnosis present

## 2017-03-26 DIAGNOSIS — I110 Hypertensive heart disease with heart failure: Secondary | ICD-10-CM | POA: Diagnosis present

## 2017-03-26 DIAGNOSIS — E119 Type 2 diabetes mellitus without complications: Secondary | ICD-10-CM | POA: Diagnosis present

## 2017-03-26 DIAGNOSIS — I214 Non-ST elevation (NSTEMI) myocardial infarction: Principal | ICD-10-CM | POA: Diagnosis present

## 2017-03-26 DIAGNOSIS — I5023 Acute on chronic systolic (congestive) heart failure: Secondary | ICD-10-CM | POA: Diagnosis not present

## 2017-03-26 DIAGNOSIS — I480 Paroxysmal atrial fibrillation: Secondary | ICD-10-CM | POA: Diagnosis present

## 2017-03-26 DIAGNOSIS — R778 Other specified abnormalities of plasma proteins: Secondary | ICD-10-CM

## 2017-03-26 DIAGNOSIS — N183 Chronic kidney disease, stage 3 unspecified: Secondary | ICD-10-CM

## 2017-03-26 LAB — COMPREHENSIVE METABOLIC PANEL
ALBUMIN: 4 g/dL (ref 3.5–5.0)
ALT: 19 U/L (ref 17–63)
ANION GAP: 11 (ref 5–15)
AST: 39 U/L (ref 15–41)
Alkaline Phosphatase: 62 U/L (ref 38–126)
BUN: 29 mg/dL — ABNORMAL HIGH (ref 6–20)
CHLORIDE: 102 mmol/L (ref 101–111)
CO2: 22 mmol/L (ref 22–32)
Calcium: 9.3 mg/dL (ref 8.9–10.3)
Creatinine, Ser: 2.14 mg/dL — ABNORMAL HIGH (ref 0.61–1.24)
GFR calc non Af Amer: 29 mL/min — ABNORMAL LOW (ref >60)
GFR, EST AFRICAN AMERICAN: 34 mL/min — AB (ref >60)
GLUCOSE: 315 mg/dL — AB (ref 65–99)
POTASSIUM: 4.5 mmol/L (ref 3.5–5.1)
SODIUM: 135 mmol/L (ref 135–145)
TOTAL PROTEIN: 7.4 g/dL (ref 6.5–8.1)
Total Bilirubin: 0.7 mg/dL (ref 0.3–1.2)

## 2017-03-26 LAB — APTT
APTT: 102 s — AB (ref 24–36)
aPTT: 86 seconds — ABNORMAL HIGH (ref 24–36)

## 2017-03-26 LAB — CBC WITH DIFFERENTIAL/PLATELET
BASOS ABS: 0.1 10*3/uL (ref 0–0.1)
Basophils Relative: 1 %
EOS PCT: 1 %
Eosinophils Absolute: 0.1 10*3/uL (ref 0–0.7)
HCT: 40.9 % (ref 40.0–52.0)
Hemoglobin: 13.9 g/dL (ref 13.0–18.0)
LYMPHS ABS: 1.5 10*3/uL (ref 1.0–3.6)
Lymphocytes Relative: 13 %
MCH: 32.4 pg (ref 26.0–34.0)
MCHC: 33.9 g/dL (ref 32.0–36.0)
MCV: 95.7 fL (ref 80.0–100.0)
Monocytes Absolute: 0.6 10*3/uL (ref 0.2–1.0)
Monocytes Relative: 6 %
Neutro Abs: 8.7 10*3/uL — ABNORMAL HIGH (ref 1.4–6.5)
Neutrophils Relative %: 79 %
PLATELETS: 235 10*3/uL (ref 150–440)
RBC: 4.27 MIL/uL — AB (ref 4.40–5.90)
RDW: 14.3 % (ref 11.5–14.5)
WBC: 11 10*3/uL — AB (ref 3.8–10.6)

## 2017-03-26 LAB — TROPONIN I
TROPONIN I: 1.68 ng/mL — AB (ref <0.03)
Troponin I: 11.34 ng/mL (ref <0.03)
Troponin I: 28.72 ng/mL (ref <0.03)

## 2017-03-26 LAB — GLUCOSE, CAPILLARY
Glucose-Capillary: 206 mg/dL — ABNORMAL HIGH (ref 65–99)
Glucose-Capillary: 110 mg/dL — ABNORMAL HIGH (ref 65–99)
Glucose-Capillary: 71 mg/dL (ref 65–99)

## 2017-03-26 LAB — PROTIME-INR
INR: 1.13
Prothrombin Time: 14.6 seconds (ref 11.4–15.2)

## 2017-03-26 LAB — LACTIC ACID, PLASMA
Lactic Acid, Venous: 2.1 mmol/L (ref 0.5–1.9)
Lactic Acid, Venous: 1.8 mmol/L (ref 0.5–1.9)

## 2017-03-26 LAB — BRAIN NATRIURETIC PEPTIDE: B NATRIURETIC PEPTIDE 5: 767 pg/mL — AB (ref 0.0–100.0)

## 2017-03-26 LAB — HEPARIN LEVEL (UNFRACTIONATED): Heparin Unfractionated: 1.2 IU/mL — ABNORMAL HIGH (ref 0.30–0.70)

## 2017-03-26 MED ORDER — HEPARIN BOLUS VIA INFUSION
4000.0000 [IU] | Freq: Once | INTRAVENOUS | Status: AC
  Administered 2017-03-26: 4000 [IU] via INTRAVENOUS
  Filled 2017-03-26: qty 4000

## 2017-03-26 MED ORDER — ONDANSETRON HCL 4 MG PO TABS: 4.0000 mg | ORAL_TABLET | Freq: Four times a day (QID) | ORAL | Status: DC | PRN

## 2017-03-26 MED ORDER — SODIUM CHLORIDE 0.9 % IV SOLN
INTRAVENOUS | Status: DC
  Administered 2017-03-26: 14:00:00 via INTRAVENOUS

## 2017-03-26 MED ORDER — ACETAMINOPHEN 650 MG RE SUPP: 650.0000 mg | Freq: Four times a day (QID) | RECTAL | Status: DC | PRN

## 2017-03-26 MED ORDER — MOMETASONE FURO-FORMOTEROL FUM 200-5 MCG/ACT IN AERO
2.0000 | INHALATION_SPRAY | Freq: Two times a day (BID) | RESPIRATORY_TRACT | Status: DC
  Administered 2017-03-26 – 2017-03-29 (×6): 2 via RESPIRATORY_TRACT
  Filled 2017-03-26: qty 8.8

## 2017-03-26 MED ORDER — LIRAGLUTIDE 18 MG/3ML ~~LOC~~ SOPN: 1.2000 mg | PEN_INJECTOR | Freq: Every evening | SUBCUTANEOUS | Status: DC

## 2017-03-26 MED ORDER — ASPIRIN 81 MG PO CHEW
324.0000 mg | CHEWABLE_TABLET | Freq: Once | ORAL | Status: AC
Start: 2017-03-26 — End: 2017-03-26
  Administered 2017-03-26: 324 mg via ORAL
  Filled 2017-03-26: qty 4

## 2017-03-26 MED ORDER — BISACODYL 10 MG RE SUPP: 10.0000 mg | Freq: Every day | RECTAL | Status: DC | PRN

## 2017-03-26 MED ORDER — AMLODIPINE-OLMESARTAN 5-40 MG PO TABS: 1.0000 | ORAL_TABLET | Freq: Every day | ORAL | Status: DC

## 2017-03-26 MED ORDER — INSULIN ASPART 100 UNIT/ML ~~LOC~~ SOLN
0.0000 [IU] | Freq: Three times a day (TID) | SUBCUTANEOUS | Status: DC
  Administered 2017-03-26: 3 [IU] via SUBCUTANEOUS
  Administered 2017-03-27: 5 [IU] via SUBCUTANEOUS
  Administered 2017-03-28: 2 [IU] via SUBCUTANEOUS
  Administered 2017-03-29: 3 [IU] via SUBCUTANEOUS
  Filled 2017-03-26: qty 2
  Filled 2017-03-26: qty 5
  Filled 2017-03-26 (×2): qty 3

## 2017-03-26 MED ORDER — IPRATROPIUM-ALBUTEROL 0.5-2.5 (3) MG/3ML IN SOLN
3.0000 mL | Freq: Four times a day (QID) | RESPIRATORY_TRACT | Status: DC
  Administered 2017-03-26 – 2017-03-27 (×3): 3 mL via RESPIRATORY_TRACT
  Filled 2017-03-26 (×4): qty 3

## 2017-03-26 MED ORDER — PIOGLITAZONE HCL 45 MG PO TABS: 45.0000 mg | ORAL_TABLET | Freq: Every day | ORAL | Status: DC

## 2017-03-26 MED ORDER — ONDANSETRON HCL 4 MG/2ML IJ SOLN: 4.0000 mg | Freq: Four times a day (QID) | INTRAMUSCULAR | Status: DC | PRN

## 2017-03-26 MED ORDER — HEPARIN (PORCINE) IN NACL 100-0.45 UNIT/ML-% IJ SOLN
1200.0000 [IU]/h | INTRAMUSCULAR | Status: DC
  Administered 2017-03-26 – 2017-03-27 (×2): 1200 [IU]/h via INTRAVENOUS
  Filled 2017-03-26 (×3): qty 250

## 2017-03-26 MED ORDER — ACETAMINOPHEN 325 MG PO TABS: 650.0000 mg | ORAL_TABLET | Freq: Four times a day (QID) | ORAL | Status: DC | PRN

## 2017-03-26 MED ORDER — LEVALBUTEROL TARTRATE 45 MCG/ACT IN AERO: 1.0000 | INHALATION_SPRAY | RESPIRATORY_TRACT | Status: DC | PRN

## 2017-03-26 MED ORDER — GABAPENTIN 300 MG PO CAPS: 300.0000 mg | ORAL_CAPSULE | Freq: Every day | ORAL | Status: DC | PRN

## 2017-03-26 MED ORDER — NITROGLYCERIN 0.4 MG SL SUBL: 0.4000 mg | SUBLINGUAL_TABLET | SUBLINGUAL | Status: DC | PRN

## 2017-03-26 MED ORDER — DOCUSATE SODIUM 100 MG PO CAPS
100.0000 mg | ORAL_CAPSULE | Freq: Two times a day (BID) | ORAL | Status: DC
  Administered 2017-03-26 – 2017-03-29 (×3): 100 mg via ORAL
  Filled 2017-03-26 (×4): qty 1

## 2017-03-26 MED ORDER — IRBESARTAN 150 MG PO TABS
300.0000 mg | ORAL_TABLET | Freq: Every day | ORAL | Status: DC
  Administered 2017-03-27 – 2017-03-29 (×2): 300 mg via ORAL
  Filled 2017-03-26 (×2): qty 2

## 2017-03-26 MED ORDER — ALBUTEROL SULFATE (2.5 MG/3ML) 0.083% IN NEBU: 2.5000 mg | INHALATION_SOLUTION | RESPIRATORY_TRACT | Status: DC | PRN

## 2017-03-26 MED ORDER — SODIUM CHLORIDE 0.9% FLUSH
3.0000 mL | Freq: Two times a day (BID) | INTRAVENOUS | Status: DC
  Administered 2017-03-26: 3 mL via INTRAVENOUS

## 2017-03-26 MED ORDER — AMLODIPINE BESYLATE 5 MG PO TABS
5.0000 mg | ORAL_TABLET | Freq: Every day | ORAL | Status: DC
  Administered 2017-03-27: 5 mg via ORAL
  Filled 2017-03-26: qty 1

## 2017-03-26 NOTE — ED Triage Notes (Signed)
Pt presents via EMS c/o SOB. Found to have bradycardia rate in 40s per EMS report. Lord MD at bedside. Recently admitted for pneumonia and had cardioversion per EMS report.

## 2017-03-26 NOTE — Progress Notes (Signed)
Pharmacy Note  Baseline aPTT and HL not drawn before administering heparin. Will draw aPTT and HL at 1600 (6 hrs from start of infusion).   Delsa Bern, PharmD

## 2017-03-26 NOTE — H&P (Signed)
History and Physical    [name]:[id number] DOB: Oct 15, 1946 DOA: 03/26/2017  Referring physician: Dr. Shaune Pollack PCP: Dorothey Baseman, MD  Specialists: none  Chief Complaint: SOB  HPI: Jeremiah Chapman is a 71 y.o. male has a past medical history significant for A-fib s/p recent ablation now with worsening SOB. No CP. In ER, pt found to be markedly bradycardic with elevated troponin. No ischemic changes on EKG. He is now admitted. No fever. Denies N/V/D. Does note some LE edema  Review of Systems: The patient denies anorexia, fever, weight loss,, vision loss, decreased hearing, hoarseness, chest pain, syncope, balance deficits, hemoptysis, abdominal pain, melena, hematochezia, severe indigestion/heartburn, hematuria, incontinence, genital sores, muscle weakness, suspicious skin lesions, transient blindness, difficulty walking, depression, unusual weight change, abnormal bleeding, enlarged lymph nodes, angioedema, and breast masses.   Past Medical History:  Diagnosis Date  . Atrial fibrillation (HCC)   . CHF (congestive heart failure) (HCC)   . Diabetes mellitus without complication (HCC)   . Gout   . Hypercholesterolemia   . Hypertension    Past Surgical History:  Procedure Laterality Date  . CARDIAC CATHETERIZATION    . CARDIOVERSION N/A 01/20/2017   Procedure: CARDIOVERSION;  Surgeon: Marcina Millard, MD;  Location: [facility] ORS;  Service: Cardiovascular;  Laterality: N/A;  . COLONOSCOPY    . CORONARY ARTERY BYPASS GRAFT    . limbar back surgery    . PENILE PROSTHESIS IMPLANT     Social History:  reports that he has been smoking Cigarettes.  He has a 25.00 pack-year smoking history. He uses smokeless tobacco. He reports that he does not use drugs. His alcohol history is not on file.  Allergies  Allergen Reactions  . Sitagliptin Rash and Other (See Comments)    Januvia    History reviewed. No pertinent family history.  Prior to Admission medications   Medication  Sig Start Date End Date Taking? Authorizing Provider  amLODipine-olmesartan (AZOR) 5-40 MG tablet Take 1 tablet by mouth daily.  01/11/16  Yes Historical Provider, MD  budesonide-formoterol (SYMBICORT) 160-4.5 MCG/ACT inhaler Inhale 2 puffs into the lungs 2 (two) times daily.   Yes Historical Provider, MD  ELIQUIS 5 MG TABS tablet Take 5 mg by mouth daily.  02/19/16  Yes Historical Provider, MD  gabapentin (NEURONTIN) 100 MG capsule Take 300 mg by mouth daily as needed (for numbness in feet).  12/12/15  Yes Historical Provider, MD  glyBURIDE-metformin (GLUCOVANCE) 5-500 MG tablet Take 2 tablets by mouth 2 (two) times daily with a meal.  06/30/15  Yes Historical Provider, MD  levalbuterol (XOPENEX HFA) 45 MCG/ACT inhaler Inhale 1 puff into the lungs every 4 (four) hours as needed for wheezing.   Yes Historical Provider, MD  linagliptin (TRADJENTA) 5 MG TABS tablet Take 5 mg by mouth daily.  10/30/15  Yes Historical Provider, MD  metoprolol succinate (TOPROL-XL) 50 MG 24 hr tablet Take 50 mg by mouth daily.  10/30/15  Yes Historical Provider, MD  pioglitazone (ACTOS) 45 MG tablet Take 45 mg by mouth daily.  01/21/16  Yes Historical Provider, MD  VICTOZA 18 MG/3ML SOPN Inject 1.2 mg into the skin every evening.  09/10/16  Yes Historical Provider, MD   Physical Exam: Vitals:   03/26/17 0833 03/26/17 0836 03/26/17 0930  BP: 105/64  (!) 103/45  Pulse: (!) 39  (!) 36  Resp: 18  (!) 27  Temp: 97.9 F (36.6 C)    SpO2: 98%  98%  Weight:  96.2 kg (212  lb)   Height:   (1.778 m)      General:  No apparent distress, WDWN, [place]/AT  Eyes: PERRL, EOMI, no scleral icterus, conjunctiva clear  ENT: moist oropharynx without exudate, TM's benign, dentition good  Neck: supple, no lymphadenopathy. No bruits or thyromegaly  Cardiovascular: slow rate with regular rhythm without MRG; 2+ peripheral pulses, no JVD, trace peripheral edema  Respiratory: basilar rales without wheezes or rhonchi. No dullness.  Respiratory effort normal  Abdomen: soft, non tender to palpation, positive bowel sounds, no guarding, no rebound  Skin: no rashes or lesions  Musculoskeletal: normal bulk and tone, no joint swelling  Psychiatric: normal mood and affect, A&OX3  Neurologic: CN 2-12 grossly intact, Motor strength 5/5 in all 4 groups with symmetric DTR's and non-focal sensory exam  Labs on Admission:  Basic Metabolic Panel:  Recent Labs Lab 03/26/17 0837  NA 135  K 4.5  CL 102  CO2 22  GLUCOSE 315*  BUN 29*  CREATININE 2.14*  CALCIUM 9.3   Liver Function Tests:  Recent Labs Lab 03/26/17 0837  AST 39  ALT 19  ALKPHOS 62  BILITOT 0.7  PROT 7.4  ALBUMIN 4.0   No results for input(s): LIPASE, AMYLASE in the last 168 hours. No results for input(s): AMMONIA in the last 168 hours. CBC:  Recent Labs Lab 03/26/17 0837  WBC 11.0*  NEUTROABS 8.7*  HGB 13.9  HCT 40.9  MCV 95.7  PLT 235   Cardiac Enzymes:  Recent Labs Lab 03/26/17 0837  TROPONINI 1.68*    BNP (last 3 results)  Recent Labs  03/26/17 0837  BNP 767.0*    ProBNP (last 3 results) No results for input(s): PROBNP in the last 8760 hours.  CBG: No results for input(s): GLUCAP in the last 168 hours.  Radiological Exams on Admission: Dg Chest Port 1 View  Result Date: 03/26/2017 CLINICAL DATA:  Bradycardia.  Shortness breath. EXAM: PORTABLE CHEST 1 VIEW COMPARISON:  01/06/2014 FINDINGS: There is mild bilateral interstitial prominence. There is no focal consolidation or pleural effusion. There is no pneumothorax. Is stable cardiomegaly. 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 The osseous structures are unremarkable. IMPRESSION: Cardiomegaly with possible mild pulmonary vascular congestion. Electronically Signed   By: Elige Ko   On: 03/26/2017 09:00    EKG: Independently reviewed.  Assessment/Plan Principal Problem:   Bradycardia Active Problems:   SOB (shortness of breath)   Elevated troponin   AKI (acute kidney  injury) (HCC)   Will admit to telemetry with Heparin drip. Hold lopressor. Consult Cardiology. Follow sugars and cardiac enzymes. Order echo. Repeat labs in AM.  Diet: clear liquids Fluids: NS@75  DVT Prophylaxis: IV Heparin  Code Status: FULL  Family Communication: none  Disposition Plan: home  Time spent: 55 min

## 2017-03-26 NOTE — ED Notes (Signed)
Report given to Angela RN

## 2017-03-26 NOTE — Progress Notes (Signed)
ANTICOAGULATION CONSULT NOTE - Initial Consult  Pharmacy Consult for Heparin Indication: atrial fibrillation  Allergies  Allergen Reactions  . Sitagliptin Rash and Other (See Comments)    Januvia    Patient Measurements: Height:  (177.8 cm) Weight: 212 lb (96.2 kg) IBW/kg (Calculated) : 73 Heparin Dosing Weight: 92.7 kg  Vital Signs: Temp: 97.9 F (36.6 C) (04/28 0833) BP: 105/64 (04/28 0833) Pulse Rate: 39 (04/28 0833)  Labs:  Recent Labs  03/26/17 0837  HGB 13.9  HCT 40.9  PLT 235  CREATININE 2.14*  TROPONINI 1.68*    Estimated Creatinine Clearance: 36.3 mL/min (A) (by C-G formula based on SCr of 2.14 mg/dL (H)).   Medical History: Past Medical History:  Diagnosis Date  . Atrial fibrillation (HCC)   . CHF (congestive heart failure) (HCC)   . Diabetes mellitus without complication (HCC)   . Gout   . Hypercholesterolemia   . Hypertension    Assessment: 71 y/o M with a h/o atrial fibrillation on Eliquis (last dose 2100 4/27) admitted with bradycardia. After discussion with ED physician, go ahead with bolus per cardiology.   Goal of Therapy:  Heparin level 0.3-0.7 units/ml aPTT 68-109 seconds Monitor platelets by anticoagulation protocol: Yes   Plan:  Give 4000 units bolus x 1 Start heparin infusion at 1200 units/hr Check aPTT level in 6 hours and HL/aPTT daily while on heparin until correlation Continue to monitor H&H and platelets  Luisa Hart D 03/26/2017,9:53 AM

## 2017-03-26 NOTE — Consult Note (Signed)
[facility] Cardiology  CARDIOLOGY CONSULT NOTE  Patient ID: Jeremiah Chapman MRN: [id number] DOB/AGE: 1946-07-15 71 y.o.  Admit date: 03/26/2017 Referring Physician Sparks Primary Physician Bronstein Primary Cardiologist Lissett Favorite Reason for Consultation Non-STEMI  HPI: 71 year old gentleman referred for evaluation of non-STEMI. The patient has known coronary artery disease, status post CABG 2 at [facility]. He has known paroxysmal atrial fibrillation, underwent recent cardioversion. He presents to [facility] emergency room with shortness of breath. The patient denies chest pain but did experience bilateral shoulder discomfort. EKG revealed bradycardia in the 40s. The patient currently is in atrial fibrillation at a rate of 75 BPM. Admission labs were notable for elevated troponin of 1.68.  Review of systems complete and found to be negative unless listed above     Past Medical History:  Diagnosis Date  . Atrial fibrillation (HCC)   . CHF (congestive heart failure) (HCC)   . Diabetes mellitus without complication (HCC)   . Gout   . Hypercholesterolemia   . Hypertension     Past Surgical History:  Procedure Laterality Date  . CARDIAC CATHETERIZATION    . CARDIOVERSION N/A 01/20/2017   Procedure: CARDIOVERSION;  Surgeon: Marcina Millard, MD;  Location: [facility] ORS;  Service: Cardiovascular;  Laterality: N/A;  . COLONOSCOPY    . CORONARY ARTERY BYPASS GRAFT    . limbar back surgery    . PENILE PROSTHESIS IMPLANT      Prescriptions Prior to Admission  Medication Sig Dispense Refill Last Dose  . amLODipine-olmesartan (AZOR) 5-40 MG tablet Take 1 tablet by mouth daily.   1 01/19/2017 at Unknown time  . budesonide-formoterol (SYMBICORT) 160-4.5 MCG/ACT inhaler Inhale 2 puffs into the lungs 2 (two) times daily.     Marland Kitchen ELIQUIS 5 MG TABS tablet Take 5 mg by mouth daily.    01/19/2017 at Unknown time  . gabapentin (NEURONTIN) 100 MG capsule Take 300 mg by mouth daily as needed (for numbness in  feet).   0 prn at prn  . glyBURIDE-metformin (GLUCOVANCE) 5-500 MG tablet Take 2 tablets by mouth 2 (two) times daily with a meal.    01/19/2017 at Unknown time  . levalbuterol (XOPENEX HFA) 45 MCG/ACT inhaler Inhale 1 puff into the lungs every 4 (four) hours as needed for wheezing.     . linagliptin (TRADJENTA) 5 MG TABS tablet Take 5 mg by mouth daily.    01/19/2017 at Unknown time  . metoprolol succinate (TOPROL-XL) 50 MG 24 hr tablet Take 50 mg by mouth daily.    01/19/2017 at Unknown time  . pioglitazone (ACTOS) 45 MG tablet Take 45 mg by mouth daily.    01/19/2017 at Unknown time  . VICTOZA 18 MG/3ML SOPN Inject 1.2 mg into the skin every evening.    01/19/2017 at Unknown time   Social History   Social History  . Marital status: Single    Spouse name: N/A  . Number of children: N/A  . Years of education: N/A   Occupational History  . Not on file.   Social History Main Topics  . Smoking status: Current Every Day Smoker    Packs/day: 0.50    Years: 50.00    Types: Cigarettes  . Smokeless tobacco: Current User  . Alcohol use Not on file  . Drug use: No  . Sexual activity: Not on file   Other Topics Concern  . Not on file   Social History Narrative  . No narrative on file    History reviewed. No pertinent family  history.    Review of systems complete and found to be negative unless listed above      PHYSICAL EXAM  General: Well developed, well nourished, in no acute distress HEENT:  Normocephalic and atramatic Neck:  No JVD.  Lungs: Clear bilaterally to auscultation and percussion. Heart: HRRR . Normal S1 and S2 without gallops or murmurs.  Abdomen: Bowel sounds are positive, abdomen soft and non-tender  Msk:  Back normal, normal gait. Normal strength and tone for age. Extremities: No clubbing, cyanosis or edema.   Neuro: Alert and oriented X 3. Psych:  Good affect, responds appropriately  Labs:   Lab Results  Component Value Date   WBC 11.0 (H) 03/26/2017    HGB 13.9 03/26/2017   HCT 40.9 03/26/2017   MCV 95.7 03/26/2017   PLT 235 03/26/2017    Recent Labs Lab 03/26/17 0837  NA 135  K 4.5  CL 102  CO2 22  BUN 29*  CREATININE 2.14*  CALCIUM 9.3  PROT 7.4  BILITOT 0.7  ALKPHOS 62  ALT 19  AST 39  GLUCOSE 315*   Lab Results  Component Value Date   TROPONINI 1.68 (HH) 03/26/2017   No results found for: CHOL No results found for: HDL No results found for: LDLCALC No results found for: TRIG No results found for: CHOLHDL No results found for: LDLDIRECT    Radiology: Dg Chest Port 1 View  Result Date: 03/26/2017 CLINICAL DATA:  Bradycardia.  Shortness breath. EXAM: PORTABLE CHEST 1 VIEW COMPARISON:  01/06/2014 FINDINGS: There is mild bilateral interstitial prominence. There is no focal consolidation or pleural effusion. There is no pneumothorax. Is stable cardiomegaly. 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 The osseous structures are unremarkable. IMPRESSION: Cardiomegaly with possible mild pulmonary vascular congestion. Electronically Signed   By: Elige Ko   On: 03/26/2017 09:00    EKG: Junctional bradycardia at a rate of 39 bpm  ASSESSMENT AND PLAN:   1. Non-STEMI, with elevated troponin, bilateral shoulder pain and shortness of breath 2. Intermittent bradycardia, currently in atrial fibrillation at a rate of 75 bpm  Recommendations  1. Continue current therapy 2. Continue heparin drip 3. Hold Eliquis 4. Hold metoprolol succinate for now 5. Cardiac catheterization with selective coronary arteriography scheduled for/30/2018. The risks, benefits alternatives to cardiac catheterization, and possible PCI were explained to the patient and informed consent was obtained.   Signed: Marcina Millard MD,PhD, [facility] 03/26/2017, 2:02 PM

## 2017-03-26 NOTE — Progress Notes (Signed)
71 yo wm admitted to room 234 via stretcher from ED with bradycardia.  A&O x 3, gait unsteady.  No distress on 2LO2 per [place].  Cardiac monitor applied to pt and verified with Dorene Grebe, CNA, pt denies chest pain at this time.  Heparin gtt infusing rt ac at 1200u/hr.  Expiratory wheezes noted bil, lungs diminished bil.  Skin intact, checked with Morrie Sheldon, RN.  Pt and wife oriented to room and surroundings, POC reviewed.  Denies need at this time. CB in reach, SR up x2.

## 2017-03-26 NOTE — ED Notes (Signed)
Attempted to call report on pt, RN taking pt is at lunch and unable to take report. Asked to give report to charge nurse and was informed that RN taking pt is charge nurse.

## 2017-03-26 NOTE — Progress Notes (Signed)
ANTICOAGULATION CONSULT NOTE - Initial Consult  Pharmacy Consult for Heparin Indication: atrial fibrillation  Allergies  Allergen Reactions  . Sitagliptin Rash and Other (See Comments)    Januvia    Patient Measurements: Height:  (177.8 cm) Weight: 213 lb (96.6 kg) IBW/kg (Calculated) : 73 Heparin Dosing Weight: 92.7 kg  Vital Signs: Temp: 97.9 F (36.6 C) (04/28 0833) Temp Source: Oral (04/28 1321) BP: 115/50 (04/28 1321) Pulse Rate: 63 (04/28 1321)  Labs:  Recent Labs  03/26/17 0837 03/26/17 1340 03/26/17 1756  HGB 13.9  --   --   HCT 40.9  --   --   PLT 235  --   --   APTT  --  102* 86*  LABPROT  --  14.6  --   INR  --  1.13  --   HEPARINUNFRC  --  1.20*  --   CREATININE 2.14*  --   --   TROPONINI 1.68* 11.34* 28.72*    Estimated Creatinine Clearance: 36.4 mL/min (A) (by C-G formula based on SCr of 2.14 mg/dL (H)).   Medical History: Past Medical History:  Diagnosis Date  . Atrial fibrillation (HCC)   . CHF (congestive heart failure) (HCC)   . Diabetes mellitus without complication (HCC)   . Gout   . Hypercholesterolemia   . Hypertension    Assessment: 71 y/o M with a h/o atrial fibrillation on Eliquis (last dose 2100 4/27) admitted with bradycardia. After discussion with ED physician, go ahead with bolus per cardiology.   Goal of Therapy:  Heparin level 0.3-0.7 units/ml aPTT 68-109 seconds Monitor platelets by anticoagulation protocol: Yes   Plan:  4000 units bolus x 1 heparin infusion at 1200 units/hr  4/28 1756 aPTT therapeutic at 86. Will redraw HL and aPTT in 8 hours. CBC with am labs per protocol.    Gardner Candle, PharmD, BCPS Clinical Pharmacist 03/26/2017 6:54 PM

## 2017-03-26 NOTE — ED Provider Notes (Signed)
[facility] Emergency Department Provider Note ____________________________________________   I have reviewed the triage vital signs and the triage nursing note.  HISTORY  Chief Complaint Shortness of Breath   Historian Patient and girlfriend   HPI Jeremiah Chapman is a 71 y.o. male with hx of recent ablation of Afib, recent completed antibiotics for pneumonia, and has cabg, presents with several days of worsening dyspnea on exertion and at rest.  No chest, neck or arm pain.  No fever.  No productive cough.  Mild lower extremity swelling, not necessarily worse than normal.  Symptoms moderate, walking makes it worse.  EMS arrived and found patient with irregular bradycardia in the 40s.    Past Medical History:  Diagnosis Date  . Atrial fibrillation (HCC)   . CHF (congestive heart failure) (HCC)   . Diabetes mellitus without complication (HCC)   . Gout   . Hypercholesterolemia   . Hypertension     Patient Active Problem List   Diagnosis Date Noted  . Breathlessness on exertion 04/15/2016  . Arteriosclerosis of coronary artery 10/21/2015  . HLD (hyperlipidemia) 02/12/2015  . Burning sensation of the foot 02/12/2015  . H/O adenomatous polyp of colon 01/08/2015  . Essential (primary) hypertension 10/14/2014  . Controlled type 2 diabetes mellitus without complication (HCC) 10/14/2014  . H/O coronary artery bypass surgery 06/13/2014  . Atrial fibrillation (HCC) 06/07/2014  . Gout 06/07/2014    Past Surgical History:  Procedure Laterality Date  . CARDIAC CATHETERIZATION    . CARDIOVERSION N/A 01/20/2017   Procedure: CARDIOVERSION;  Surgeon: Marcina Millard, MD;  Location: [facility] ORS;  Service: Cardiovascular;  Laterality: N/A;  . COLONOSCOPY    . CORONARY ARTERY BYPASS GRAFT    . limbar back surgery    . PENILE PROSTHESIS IMPLANT      Prior to Admission medications   Medication Sig Start Date End Date Taking? Authorizing Provider   amLODipine-olmesartan (AZOR) 5-40 MG tablet Take 1 tablet by mouth daily.  01/11/16  Yes Historical Provider, MD  budesonide-formoterol (SYMBICORT) 160-4.5 MCG/ACT inhaler Inhale 2 puffs into the lungs 2 (two) times daily.   Yes Historical Provider, MD  ELIQUIS 5 MG TABS tablet Take 5 mg by mouth daily.  02/19/16  Yes Historical Provider, MD  gabapentin (NEURONTIN) 100 MG capsule Take 300 mg by mouth daily as needed (for numbness in feet).  12/12/15  Yes Historical Provider, MD  glyBURIDE-metformin (GLUCOVANCE) 5-500 MG tablet Take 2 tablets by mouth 2 (two) times daily with a meal.  06/30/15  Yes Historical Provider, MD  levalbuterol (XOPENEX HFA) 45 MCG/ACT inhaler Inhale 1 puff into the lungs every 4 (four) hours as needed for wheezing.   Yes Historical Provider, MD  linagliptin (TRADJENTA) 5 MG TABS tablet Take 5 mg by mouth daily.  10/30/15  Yes Historical Provider, MD  metoprolol succinate (TOPROL-XL) 50 MG 24 hr tablet Take 50 mg by mouth daily.  10/30/15  Yes Historical Provider, MD  pioglitazone (ACTOS) 45 MG tablet Take 45 mg by mouth daily.  01/21/16  Yes Historical Provider, MD  VICTOZA 18 MG/3ML SOPN Inject 1.2 mg into the skin every evening.  09/10/16  Yes Historical Provider, MD    Allergies  Allergen Reactions  . Sitagliptin Rash and Other (See Comments)    Januvia    No family history on file.  Social History Social History  Substance Use Topics  . Smoking status: Current Every Day Smoker    Packs/day: 0.50    Years: 50.00  Types: Cigarettes  . Smokeless tobacco: Current User  . Alcohol use Not on file    Review of Systems  Constitutional: Negative for fever. Eyes: Negative for visual changes. ENT: Negative for sore throat. Cardiovascular: Negative for chest pain. Respiratory: Positive for shortness of breath. Gastrointestinal: Negative for abdominal pain, vomiting and diarrhea. Genitourinary: Negative for dysuria. Musculoskeletal: Negative for back pain. Skin:  Negative for rash. Neurological: Negative for headache. 10 point Review of Systems otherwise negative ____________________________________________   PHYSICAL EXAM:  VITAL SIGNS: ED Triage Vitals  Enc Vitals Group     BP 03/26/17 0833 105/64     Pulse Rate 03/26/17 0833 (!) 39     Resp 03/26/17 0833 18     Temp 03/26/17 0833 97.9 F (36.6 C)     Temp src --      SpO2 03/26/17 0833 98 %     Weight 03/26/17 0836 212 lb (96.2 kg)     Height 03/26/17 0836  (1.778 m)     Head Circumference --      Peak Flow --      Pain Score --      Pain Loc --      Pain Edu? --      Excl. in GC? --      Constitutional: Alert and oriented. Well appearing and in no distress. HEENT   Head: Normocephalic and atraumatic.      Eyes: Conjunctivae are normal. PERRL. Normal extraocular movements.      Ears:         Nose: No congestion/rhinnorhea.   Mouth/Throat: Mucous membranes are moist.   Neck: No stridor. Cardiovascular/Chest: Bradycardic, regular.  No murmurs, rubs, or gallops. Respiratory: Normal respiratory effort without tachypnea nor retractions. Some decreased bs at the bases, no wheezing,ronchi or rales. Gastrointestinal: Soft. No distention, no guarding, no rebound. Nontender.  Obese  Genitourinary/rectal:Deferred Musculoskeletal: Nontender with normal range of motion in all extremities. No joint effusions.  No lower extremity tenderness.  1+ LE edema bilaterally Neurologic:  Normal speech and language. No gross or focal neurologic deficits are appreciated. Skin:  Skin is warm, dry and intact. No rash noted. Psychiatric: Mood and affect are normal. Speech and behavior are normal. Patient exhibits appropriate insight and judgment.   ____________________________________________  LABS (pertinent positives/negatives)  Labs Reviewed  COMPREHENSIVE METABOLIC PANEL - Abnormal; Notable for the following:       Result Value   Glucose, Bld 315 (*)    BUN 29 (*)     Creatinine, Ser 2.14 (*)    GFR calc non Af Amer 29 (*)    GFR calc Af Amer 34 (*)    All other components within normal limits  TROPONIN I - Abnormal; Notable for the following:    Troponin I 1.68 (*)    All other components within normal limits  CBC WITH DIFFERENTIAL/PLATELET - Abnormal; Notable for the following:    WBC 11.0 (*)    RBC 4.27 (*)    Neutro Abs 8.7 (*)    All other components within normal limits  BRAIN NATRIURETIC PEPTIDE - Abnormal; Notable for the following:    B Natriuretic Peptide 767.0 (*)    All other components within normal limits  LACTIC ACID, PLASMA  LACTIC ACID, PLASMA  APTT  HEPARIN LEVEL (UNFRACTIONATED)  PROTIME-INR    ____________________________________________    EKG I, Governor Rooks, MD, the attending physician have personally viewed and interpreted all ECGs.  39 bpm. Appears to be a sinus  bradycardia. Nonspecific intraventricular conduction delay.  ST segment depression with T-wave inversions laterally, which is somewhat different from prior EKG. Minimal ST segment elevation in aVR. ____________________________________________  RADIOLOGY All Xrays were viewed by me. Imaging interpreted by Radiologist.  Chest x-ray portable:  IMPRESSION: Cardiomegaly with possible mild pulmonary vascular congestion. __________________________________________  PROCEDURES  Procedure(s) performed: None  Critical Care performed: CRITICAL CARE Performed by: Governor Rooks   Total critical care time: 60 minutes  Critical care time was exclusive of separately billable procedures and treating other patients.  Critical care was necessary to treat or prevent imminent or life-threatening deterioration.  Critical care was time spent personally by me on the following activities: development of treatment plan with patient and/or surrogate as well as nursing, discussions with consultants, evaluation of patient's response to treatment, examination of patient,  obtaining history from patient or surrogate, ordering and performing treatments and interventions, ordering and review of laboratory studies, ordering and review of radiographic studies, pulse oximetry and re-evaluation of patient's condition.   ____________________________________________   ED COURSE / ASSESSMENT AND PLAN  Pertinent labs & imaging results that were available during my care of the patient were reviewed by me and considered in my medical decision making (see chart for details).   Mr. Hillier arrived emergency traffic by EMS for complaint of dyspnea with bradycardia. He was alert and talking, no altered mental status, but bradycardic 35-45. EKG appeared to be regular and if it was most consistent with a likely sinus bradycardia, however consider other rhythm.  Patient recently had ablation for A. fib and is still on metoprolol, states he was told to decrease the dose to half but has not done that. My initial suspicion was sinus bradycardia due to metoprolol in setting of recent A. fib cardioversion.  No hypoxia. No chest pain.  Placed on cardiac pads.  Nurse reported to me that troponin was elevated above 1. Patient was given aspirin. I spoke with Dr. Darrold Junker, the patient's cardiologist who does recommend proceeding with heparin. I also spoke with the pharmacist regarding bolus and drip in the setting of Eloquis. Next  I updated the patient and his girlfriend regarding the diagnosis of MI. Consulted hospitalist for admission. Cardiologist Dr. Darrold Junker will consult on this patient.  Also Acute kidney injury. Holding off on additional fluids with leg edema and elevated bnp, concerning for some CHF.    CONSULTATIONS:   Cardiology, by phone.  Hospitalist face to face for admission.   Patient / Family / Caregiver informed of clinical course, medical decision-making process, and agree with plan.   ___________________________________________   FINAL CLINICAL  IMPRESSION(S) / ED DIAGNOSES   Final diagnoses:  Dyspnea, unspecified type  Bradycardia by electrocardiogram  NSTEMI (non-ST elevated myocardial infarction) (HCC)  Acute renal failure, unspecified acute renal failure type (HCC)  Acute on chronic congestive heart failure, unspecified heart failure type [facility])              Note: This dictation was prepared with Dragon dictation. Any transcriptional errors that result from this process are unintentional    Governor Rooks, MD 03/26/17 [phone]

## 2017-03-26 NOTE — Plan of Care (Signed)
Problem: Activity: Goal: Ability to tolerate increased activity will improve Outcome: Progressing Possible cath on Monday  Problem: Safety: Goal: Ability to remain free from injury will improve Outcome: Progressing Fall precautions in place, non skid socks when oob  Problem: Pain Managment: Goal: General experience of comfort will improve Outcome: Progressing Prn medications  Problem: Tissue Perfusion: Goal: Risk factors for ineffective tissue perfusion will decrease Outcome: Progressing Heparin gtt infusing

## 2017-03-26 NOTE — Progress Notes (Signed)
CRITICAL VALUE ALERT  Critical value received:  Troponin 11.34  Date of notification:  03/26/17  Time of notification:  1435  Critical value read back:yes    Nurse who received alert:  Lunette Stands   MD notified (1st page):  1440  Time of first page:   MD notified (2nd page):  Time of second page:  Responding MD:  Paraschos  Time MD responded:  1440

## 2017-03-27 ENCOUNTER — Inpatient Hospital Stay: Admit: 2017-03-27 | Payer: Medicare Other

## 2017-03-27 ENCOUNTER — Other Ambulatory Visit: Payer: Self-pay

## 2017-03-27 LAB — GLUCOSE, CAPILLARY
GLUCOSE-CAPILLARY: 113 mg/dL — AB (ref 65–99)
Glucose-Capillary: 102 mg/dL — ABNORMAL HIGH (ref 65–99)
Glucose-Capillary: 274 mg/dL — ABNORMAL HIGH (ref 65–99)
Glucose-Capillary: 116 mg/dL — ABNORMAL HIGH (ref 65–99)

## 2017-03-27 LAB — CBC
HCT: 36.5 % — ABNORMAL LOW (ref 40.0–52.0)
Hemoglobin: 12.7 g/dL — ABNORMAL LOW (ref 13.0–18.0)
MCH: 33.5 pg (ref 26.0–34.0)
MCHC: 34.8 g/dL (ref 32.0–36.0)
MCV: 96.4 fL (ref 80.0–100.0)
PLATELETS: 211 10*3/uL (ref 150–440)
RBC: 3.78 MIL/uL — AB (ref 4.40–5.90)
RDW: 14.4 % (ref 11.5–14.5)
WBC: 11.9 10*3/uL — AB (ref 3.8–10.6)

## 2017-03-27 LAB — TROPONIN I: TROPONIN I: 32.81 ng/mL — AB (ref <0.03)

## 2017-03-27 LAB — HEPARIN LEVEL (UNFRACTIONATED)
HEPARIN UNFRACTIONATED: 0.66 [IU]/mL (ref 0.30–0.70)
HEPARIN UNFRACTIONATED: 0.84 [IU]/mL — AB (ref 0.30–0.70)

## 2017-03-27 LAB — COMPREHENSIVE METABOLIC PANEL
ALK PHOS: 46 U/L (ref 38–126)
ALT: 24 U/L (ref 17–63)
AST: 95 U/L — ABNORMAL HIGH (ref 15–41)
Albumin: 3.7 g/dL (ref 3.5–5.0)
Anion gap: 9 (ref 5–15)
BILIRUBIN TOTAL: 0.8 mg/dL (ref 0.3–1.2)
BUN: 28 mg/dL — ABNORMAL HIGH (ref 6–20)
CALCIUM: 8.9 mg/dL (ref 8.9–10.3)
CO2: 26 mmol/L (ref 22–32)
CREATININE: 1.83 mg/dL — AB (ref 0.61–1.24)
Chloride: 102 mmol/L (ref 101–111)
GFR, EST AFRICAN AMERICAN: 41 mL/min — AB (ref >60)
GFR, EST NON AFRICAN AMERICAN: 35 mL/min — AB (ref >60)
Glucose, Bld: 83 mg/dL (ref 65–99)
Potassium: 3.7 mmol/L (ref 3.5–5.1)
Sodium: 137 mmol/L (ref 135–145)
TOTAL PROTEIN: 7 g/dL (ref 6.5–8.1)

## 2017-03-27 LAB — APTT
APTT: 80 s — AB (ref 24–36)
aPTT: 70 seconds — ABNORMAL HIGH (ref 24–36)

## 2017-03-27 MED ORDER — SODIUM CHLORIDE 0.9 % WEIGHT BASED INFUSION: 3.0000 mL/kg/h | INTRAVENOUS | Status: AC

## 2017-03-27 MED ORDER — ASPIRIN 81 MG PO CHEW
81.0000 mg | CHEWABLE_TABLET | ORAL | Status: AC
  Administered 2017-03-28: 81 mg via ORAL
  Filled 2017-03-27: qty 1

## 2017-03-27 MED ORDER — SODIUM CHLORIDE 0.9 % WEIGHT BASED INFUSION: 1.0000 mL/kg/h | INTRAVENOUS | Status: DC

## 2017-03-27 MED ORDER — IPRATROPIUM-ALBUTEROL 0.5-2.5 (3) MG/3ML IN SOLN: 3.0000 mL | RESPIRATORY_TRACT | Status: DC | PRN

## 2017-03-27 MED ORDER — ATORVASTATIN CALCIUM 20 MG PO TABS
80.0000 mg | ORAL_TABLET | Freq: Every day | ORAL | Status: DC
  Administered 2017-03-27 – 2017-03-28 (×2): 80 mg via ORAL
  Filled 2017-03-27 (×2): qty 4

## 2017-03-27 NOTE — Progress Notes (Signed)
Dr. Cherlynn Kaiser and Dr. Darrold Junker made aware of pt's 6 beat run vtach and his bradycardia.  No new orders received, will monitor pt.

## 2017-03-27 NOTE — Progress Notes (Signed)
ANTICOAGULATION CONSULT NOTE - Initial Consult  Pharmacy Consult for Heparin Indication: atrial fibrillation       Allergies  Allergen Reactions  . Sitagliptin Rash and Other (See Comments)    Januvia    Patient Measurements: Height:  (177.8 cm) Weight: 213 lb (96.6 kg) IBW/kg (Calculated) : 73 Heparin Dosing Weight: 92.7 kg  Vital Signs: Temp: 97.9 F (36.6 C) (04/28 0833) Temp Source: Oral (04/28 1321) BP: 115/50 (04/28 1321) Pulse Rate: 63 (04/28 1321)  Labs:  Recent Labs (last 2 labs)    Recent Labs  03/26/17 0837 03/26/17 1340 03/26/17 1756  HGB 13.9  --   --   HCT 40.9  --   --   PLT 235  --   --   APTT  --  102* 86*  LABPROT  --  14.6  --   INR  --  1.13  --   HEPARINUNFRC  --  1.20*  --   CREATININE 2.14*  --   --   TROPONINI 1.68* 11.34* 28.72*      Estimated Creatinine Clearance: 36.4 mL/min (A) (by C-G formula based on SCr of 2.14 mg/dL (H)).   Medical History:     Past Medical History:  Diagnosis Date  . Atrial fibrillation (HCC)   . CHF (congestive heart failure) (HCC)   . Diabetes mellitus without complication (HCC)   . Gout   . Hypercholesterolemia   . Hypertension    Assessment: 71 y/o M with a h/o atrial fibrillation on Eliquis (last dose 2100 4/27) admitted with bradycardia. After discussion with ED physician, go ahead with bolus per cardiology.   Goal of Therapy:  Heparin level 0.3-0.7 units/ml aPTT 68-109 seconds Monitor platelets by anticoagulation protocol: Yes   Plan:  4000 units bolus x 1 heparin infusion at 1200 units/hr  4/28 1756 aPTT therapeutic at 86. Will redraw HL and aPTT in 8 hours. CBC with am labs per protocol.   4/29 @ 0000 aPTT 80 therapeutic. Will draw next HL and aPTT w/ am labs.  Thank you for this consult.  Thomasene Ripple, PharmD, BCPS Clinical Pharmacist 03/27/2017

## 2017-03-27 NOTE — Progress Notes (Signed)
[facility]  SUBJECTIVE: I don't have chest pain   Vitals:   03/26/17 1637 03/26/17 1919 03/27/17 0507 03/27/17 0712  BP:  (!) 119/50 117/75 125/79  Pulse:  67 74 (!) 115  Resp:  Temp:  98.6 F (37 C) 97.5 F (36.4 C) 98.2 F (36.8 C)  TempSrc:  Oral Oral Oral  SpO2: 98% 99% 100% 95%  Weight:      Height:         Intake/Output Summary (Last 24 hours) at 03/27/17 0919 Last data filed at 03/27/17 0900  Gross per 24 hour  Intake            347.2 ml  Output             1100 ml  Net           -752.8 ml      PHYSICAL EXAM  General: Well developed, well nourished, in no acute distress HEENT:  Normocephalic and atramatic Neck:  No JVD.  Lungs: Clear bilaterally to auscultation and percussion. Heart: HRRR . Normal S1 and S2 without gallops or murmurs.  Abdomen: Bowel sounds are positive, abdomen soft and non-tender  Msk:  Back normal, normal gait. Normal strength and tone for age. Extremities: No clubbing, cyanosis or edema.   Neuro: Alert and oriented X 3. Psych:  Good affect, responds appropriately   LABS: Basic Metabolic Panel:  Recent Labs  16/10/96 0837 03/27/17 0028  NA 135 137  K 4.5 3.7  CL 102 102  CO2 22 26  GLUCOSE 315* 83  BUN 29* 28*  CREATININE 2.14* 1.83*  CALCIUM 9.3 8.9   Liver Function Tests:  Recent Labs  03/26/17 0837 03/27/17 0028  AST 39 95*  ALT 19 24  ALKPHOS 62 46  BILITOT 0.7 0.8  PROT 7.4 7.0  ALBUMIN 4.0 3.7   No results for input(s): LIPASE, AMYLASE in the last 72 hours. CBC:  Recent Labs  03/26/17 0837 03/27/17 0028  WBC 11.0* 11.9*  NEUTROABS 8.7*  --   HGB 13.9 12.7*  HCT 40.9 36.5*  MCV 95.7 96.4  PLT 235 211   Cardiac Enzymes:  Recent Labs  03/26/17 1340 03/26/17 1756 03/27/17 0028  TROPONINI 11.34* 28.72* 32.81*   BNP: Invalid input(s): POCBNP D-Dimer: No results for input(s): DDIMER in the last 72 hours. Hemoglobin A1C: No results for input(s): HGBA1C in the last 72  hours. Fasting Lipid Panel: No results for input(s): CHOL, HDL, LDLCALC, TRIG, CHOLHDL, LDLDIRECT in the last 72 hours. Thyroid Function Tests: No results for input(s): TSH, T4TOTAL, T3FREE, THYROIDAB in the last 72 hours.  Invalid input(s): FREET3 Anemia Panel: No results for input(s): VITAMINB12, FOLATE, FERRITIN, TIBC, IRON, RETICCTPCT in the last 72 hours.  Dg Chest Port 1 View  Result Date: 03/26/2017 CLINICAL DATA:  Bradycardia.  Shortness breath. EXAM: PORTABLE CHEST 1 VIEW COMPARISON:  01/06/2014 FINDINGS: There is mild bilateral interstitial prominence. There is no focal consolidation or pleural effusion. There is no pneumothorax. Is stable cardiomegaly. 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 The osseous structures are unremarkable. IMPRESSION: Cardiomegaly with possible mild pulmonary vascular congestion. Electronically Signed   By: Elige Ko   On: 03/26/2017 09:00     Echo pending  TELEMETRY: Atrial fibrillation at a rate of 70 bpm:  ASSESSMENT AND PLAN:  Principal Problem:   Bradycardia Active Problems:   SOB (shortness of breath)   Elevated troponin   AKI (acute kidney injury) (HCC)    1. Non-STEMI,  peak troponin 32.81, no further chest pain 2. Atrial fibrillation, rate controlled, Eliquis held for cardiac catheterization 3. Bradycardia, resolved  Recommendations  1. Continue heparin drip 2. Hold Eliquis 3. Cardiac catheterization with selective coronary arteriography, and possible PCI scheduled for a.m.   Marcina Millard, MD, PhD, [facility] 03/27/2017 9:19 AM

## 2017-03-27 NOTE — Progress Notes (Signed)
ANTICOAGULATION CONSULT NOTE - Initial Consult  Pharmacy Consult for Heparin Indication: atrial fibrillation       Allergies  Allergen Reactions  . Sitagliptin Rash and Other (See Comments)    Januvia    Patient Measurements: Height:  (177.8 cm) Weight: 213 lb (96.6 kg) IBW/kg (Calculated) : 73 Heparin Dosing Weight: 92.7 kg  Vital Signs: Temp: 97.9 F (36.6 C) (04/28 0833) Temp Source: Oral (04/28 1321) BP: 115/50 (04/28 1321) Pulse Rate: 63 (04/28 1321)  Labs:  Recent Labs (last 2 labs)    Recent Labs  03/26/17 0837 03/26/17 1340 03/26/17 1756  HGB 13.9  --   --   HCT 40.9  --   --   PLT 235  --   --   APTT  --  102* 86*  LABPROT  --  14.6  --   INR  --  1.13  --   HEPARINUNFRC  --  1.20*  --   CREATININE 2.14*  --   --   TROPONINI 1.68* 11.34* 28.72*      Estimated Creatinine Clearance: 36.4 mL/min (A) (by C-G formula based on SCr of 2.14 mg/dL (H)).   Medical History:     Past Medical History:  Diagnosis Date  . Atrial fibrillation (HCC)   . CHF (congestive heart failure) (HCC)   . Diabetes mellitus without complication (HCC)   . Gout   . Hypercholesterolemia   . Hypertension    Assessment: 71 y/o M with a h/o atrial fibrillation on Eliquis (last dose 2100 4/27) admitted with bradycardia. After discussion with ED physician, go ahead with bolus per cardiology.   Goal of Therapy:  Heparin level 0.3-0.7 units/ml aPTT 68-109 seconds Monitor platelets by anticoagulation protocol: Yes   Plan:  Both heparin level and aPTT therapeutic. Continue heparin at 1200 units/hr. Recheck aPTT/HL with AM labs.  Thank you for this consult.  Garlon Hatchet, PharmD, BCPS Clinical Pharmacist  03/27/2017

## 2017-03-27 NOTE — Progress Notes (Signed)
Pt complaining of not being able to catch his breath, using accessory muscles, resp 24.  Oxygen saturation checked 93-94 on 2L per [place].  Increased oxygen to 3L, sats 95-96 %.  Dr. Cherlynn Kaiser paged and updated, orders to stop IVF at this time.  Will continue to monitor pt.

## 2017-03-27 NOTE — Progress Notes (Signed)
[facility] [place] at [facility]   PATIENT NAME: Jeremiah Chapman    MR#:  [id number]  DATE OF BIRTH:  Mar 25, 1946  SUBJECTIVE:   Patient here with shortness of breath and ruled in by cardiac markers for a non-ST elevation MI. Currently not having any chest pain. Family at bedside. No other acute complaints presently.  REVIEW OF SYSTEMS:    Review of Systems  Constitutional: Negative for chills and fever.  HENT: Negative for congestion and tinnitus.   Eyes: Negative for blurred vision and double vision.  Respiratory: Positive for shortness of breath. Negative for cough and wheezing.   Cardiovascular: Negative for chest pain, orthopnea and PND.  Gastrointestinal: Negative for abdominal pain, diarrhea, nausea and vomiting.  Genitourinary: Negative for dysuria and hematuria.  Neurological: Negative for dizziness, sensory change and focal weakness.  All other systems reviewed and are negative.   Nutrition: Clear liquids Tolerating Diet: Yes Tolerating PT: Ambulatory  DRUG ALLERGIES:   Allergies  Allergen Reactions  . Sitagliptin Rash and Other (See Comments)    Januvia    VITALS:  Blood pressure 125/79, pulse (!) 115, temperature 98.2 F (36.8 C), temperature source Oral, resp. rate 17, height  (1.778 m), weight 96.6 kg (213 lb), SpO2 95 %.  PHYSICAL EXAMINATION:   Physical Exam  GENERAL:  71 y.o.-year-old obese patient lying in bed in no acute distress.  EYES: Pupils equal, round, reactive to light and accommodation. No scleral icterus. Extraocular muscles intact.  HEENT: Head atraumatic, normocephalic. Oropharynx and nasopharynx clear.  NECK:  Supple, no jugular venous distention. No thyroid enlargement, no tenderness.  LUNGS: Prolonged inspiratory and expiratory phase, no wheezing, rales, rhonchi. No use of accessory muscles of respiration.  CARDIOVASCULAR: S1, S2 normal. No murmurs, rubs, or gallops.  ABDOMEN: Soft, nontender, nondistended.  Bowel sounds present. No organomegaly or mass.  EXTREMITIES: No cyanosis, clubbing or edema b/l.    NEUROLOGIC: Cranial nerves II through XII are intact. No focal Motor or sensory deficits b/l.   PSYCHIATRIC: The patient is alert and oriented x 3.  SKIN: No obvious rash, lesion, or ulcer.    LABORATORY PANEL:   CBC  Recent Labs Lab 03/27/17 0028  WBC 11.9*  HGB 12.7*  HCT 36.5*  PLT 211   ------------------------------------------------------------------------------------------------------------------  Chemistries   Recent Labs Lab 03/27/17 0028  NA 137  K 3.7  CL 102  CO2 26  GLUCOSE 83  BUN 28*  CREATININE 1.83*  CALCIUM 8.9  AST 95*  ALT 24  ALKPHOS 46  BILITOT 0.8   ------------------------------------------------------------------------------------------------------------------  Cardiac Enzymes  Recent Labs Lab 03/27/17 0028  TROPONINI 32.81*   ------------------------------------------------------------------------------------------------------------------  RADIOLOGY:  Dg Chest Port 1 View  Result Date: 03/26/2017 CLINICAL DATA:  Bradycardia.  Shortness breath. EXAM: PORTABLE CHEST 1 VIEW COMPARISON:  01/06/2014 FINDINGS: There is mild bilateral interstitial prominence. There is no focal consolidation or pleural effusion. There is no pneumothorax. Is stable cardiomegaly. 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 The osseous structures are unremarkable. IMPRESSION: Cardiomegaly with possible mild pulmonary vascular congestion. Electronically Signed   By: Elige Ko   On: 03/26/2017 09:00     ASSESSMENT AND PLAN:   71 year old male with past medical history of diabetes, hypertension, COPD with ongoing tobacco abuse, CHF, atrial fibrillation, gout who presented to the hospital due to shortness of breath and ruled in by cardiac markers for a non-ST elevation MI.  1. Non-ST elevation MI-patient has ruled in by cardiac markers as his troponins have trended  upwards. -Patient presented with shortness of breath but no chest pain and shortness of breath is likely an anginal equivalent. -Continue aspirin, atorvastatin, heparin drip, Norvasc, Avapro.  Bradycardic and not on B-blocker for now.  -Seen by cardiology and plan for cardiac catheterization tomorrow.  2. Essential hypertension-continue Norvasc, Avapro.   3. Diabetes type 2 without complication-continue sliding scale insulin.  4. COPD with ongoing tobacco abuse-no acute exacerbation. Continue duo nebs as needed and Dulera.     All the records are reviewed and case discussed with Care Management/Social Worker. Management plans discussed with the patient, family and they are in agreement.  CODE STATUS: Full  DVT Prophylaxis: Heparin drip  TOTAL TIME TAKING CARE OF THIS PATIENT: 30 minutes.   POSSIBLE D/C IN 1-2 DAYS, DEPENDING ON CLINICAL CONDITION.   Houston Siren M.D on 03/27/2017 at 12:51 PM  Between 7am to 6pm - Pager - [phone]  After 6pm go to [email or web address] - Social research officer, government  [employer] [facility]  Office  [phone]  CC: Primary care physician; Dorothey Baseman, MD

## 2017-03-27 NOTE — Plan of Care (Signed)
Problem: Activity: Goal: Ability to tolerate increased activity will improve Outcome: Not Progressing SOB on exertion  Problem: Cardiac: Goal: Ability to achieve and maintain adequate cardiopulmonary perfusion will improve Outcome: Progressing Awaiting heart cath tomorrow  Problem: Education: Goal: Understanding of medication regimen will improve Outcome: Progressing Lipitor added to medications today  Problem: Safety: Goal: Ability to remain free from injury will improve Outcome: Progressing Fall precautions in place, non skid socks  Problem: Pain Managment: Goal: General experience of comfort will improve Outcome: Progressing Prn medications  Problem: Tissue Perfusion: Goal: Risk factors for ineffective tissue perfusion will decrease Outcome: Progressing Remains on Heparin gtt

## 2017-03-28 ENCOUNTER — Inpatient Hospital Stay
Admit: 2017-03-28 | Discharge: 2017-03-28 | Disposition: A | Discharge status: Home / Self Care | Payer: Medicare Other | Attending: Internal Medicine | Admitting: Internal Medicine

## 2017-03-28 ENCOUNTER — Encounter
Admission: EM | Disposition: A | Discharge status: Home / Self Care | Payer: Self-pay | Source: Home / Self Care | Attending: Internal Medicine

## 2017-03-28 HISTORY — PX: LEFT HEART CATH AND CORONARY ANGIOGRAPHY: CATH118249

## 2017-03-28 HISTORY — PX: CORONARY STENT INTERVENTION: CATH118234

## 2017-03-28 LAB — BASIC METABOLIC PANEL
Anion gap: 5 (ref 5–15)
BUN: 28 mg/dL — AB (ref 6–20)
CHLORIDE: 104 mmol/L (ref 101–111)
CO2: 26 mmol/L (ref 22–32)
Calcium: 8.7 mg/dL — ABNORMAL LOW (ref 8.9–10.3)
Creatinine, Ser: 1.72 mg/dL — ABNORMAL HIGH (ref 0.61–1.24)
GFR calc Af Amer: 44 mL/min — ABNORMAL LOW (ref >60)
GFR calc non Af Amer: 38 mL/min — ABNORMAL LOW (ref >60)
Glucose, Bld: 116 mg/dL — ABNORMAL HIGH (ref 65–99)
POTASSIUM: 4 mmol/L (ref 3.5–5.1)
SODIUM: 135 mmol/L (ref 135–145)

## 2017-03-28 LAB — CBC
HEMATOCRIT: 34.8 % — AB (ref 40.0–52.0)
Hemoglobin: 12 g/dL — ABNORMAL LOW (ref 13.0–18.0)
MCH: 33.7 pg (ref 26.0–34.0)
MCHC: 34.6 g/dL (ref 32.0–36.0)
MCV: 97.6 fL (ref 80.0–100.0)
PLATELETS: 181 10*3/uL (ref 150–440)
RBC: 3.57 MIL/uL — ABNORMAL LOW (ref 4.40–5.90)
RDW: 13.9 % (ref 11.5–14.5)
WBC: 10.1 10*3/uL (ref 3.8–10.6)

## 2017-03-28 LAB — BLOOD GAS, ARTERIAL
ACID-BASE DEFICIT: 2.9 mmol/L — AB (ref 0.0–2.0)
Bicarbonate: 21.1 mmol/L (ref 20.0–28.0)
FIO2: 0.32
O2 SAT: 90.2 %
PCO2 ART: 34 mmHg (ref 32.0–48.0)
PH ART: 7.4 (ref 7.350–7.450)
PO2 ART: 59 mmHg — AB (ref 83.0–108.0)
Patient temperature: 37

## 2017-03-28 LAB — GLUCOSE, CAPILLARY
GLUCOSE-CAPILLARY: 109 mg/dL — AB (ref 65–99)
GLUCOSE-CAPILLARY: 158 mg/dL — AB (ref 65–99)
Glucose-Capillary: 110 mg/dL — ABNORMAL HIGH (ref 65–99)

## 2017-03-28 LAB — POCT ACTIVATED CLOTTING TIME: Activated Clotting Time: 296 seconds

## 2017-03-28 LAB — APTT: aPTT: 68 seconds — ABNORMAL HIGH (ref 24–36)

## 2017-03-28 LAB — HEPARIN LEVEL (UNFRACTIONATED): Heparin Unfractionated: 0.32 IU/mL (ref 0.30–0.70)

## 2017-03-28 LAB — ECHOCARDIOGRAM COMPLETE
HEIGHTINCHES: 70 in
Weight: 3389.79 oz

## 2017-03-28 SURGERY — LEFT HEART CATH AND CORONARY ANGIOGRAPHY
Anesthesia: Moderate Sedation

## 2017-03-28 MED ORDER — FENTANYL CITRATE (PF) 100 MCG/2ML IJ SOLN
INTRAMUSCULAR | Status: DC | PRN
  Administered 2017-03-28: 25 ug via INTRAVENOUS

## 2017-03-28 MED ORDER — SODIUM CHLORIDE 0.9 % WEIGHT BASED INFUSION
1.0000 mL/kg/h | INTRAVENOUS | Status: AC
  Administered 2017-03-28: 1 mL/kg/h via INTRAVENOUS

## 2017-03-28 MED ORDER — HYDRALAZINE HCL 20 MG/ML IJ SOLN: 5.0000 mg | INTRAMUSCULAR | Status: AC | PRN

## 2017-03-28 MED ORDER — SODIUM CHLORIDE 0.9 % WEIGHT BASED INFUSION: 1.0000 mL/kg/h | INTRAVENOUS | Status: AC

## 2017-03-28 MED ORDER — BIVALIRUDIN BOLUS VIA INFUSION - CUPID
INTRAVENOUS | Status: DC | PRN
  Administered 2017-03-28: 72.075 mg via INTRAVENOUS

## 2017-03-28 MED ORDER — IOPAMIDOL (ISOVUE-300) INJECTION 61%
INTRAVENOUS | Status: DC | PRN
  Administered 2017-03-28: 260 mL via INTRA_ARTERIAL

## 2017-03-28 MED ORDER — SODIUM CHLORIDE 0.9 % IV SOLN
INTRAVENOUS | Status: DC | PRN
  Administered 2017-03-28: 1.75 mg/kg/h via INTRAVENOUS

## 2017-03-28 MED ORDER — BIVALIRUDIN TRIFLUOROACETATE 250 MG IV SOLR
INTRAVENOUS | Status: AC
  Filled 2017-03-28: qty 250

## 2017-03-28 MED ORDER — HYDRALAZINE HCL 20 MG/ML IJ SOLN: 5.0000 mg | INTRAMUSCULAR | Status: DC | PRN

## 2017-03-28 MED ORDER — LIDOCAINE HCL (PF) 1 % IJ SOLN
INTRAMUSCULAR | Status: AC
  Filled 2017-03-28: qty 30

## 2017-03-28 MED ORDER — SODIUM CHLORIDE 0.9% FLUSH
3.0000 mL | Freq: Two times a day (BID) | INTRAVENOUS | Status: DC
  Administered 2017-03-29: 3 mL via INTRAVENOUS

## 2017-03-28 MED ORDER — ONDANSETRON HCL 4 MG/2ML IJ SOLN: 4.0000 mg | Freq: Four times a day (QID) | INTRAMUSCULAR | Status: DC | PRN

## 2017-03-28 MED ORDER — FUROSEMIDE 10 MG/ML IJ SOLN
INTRAMUSCULAR | Status: AC
Start: 2017-03-28 — End: 2017-03-28
  Filled 2017-03-28: qty 4

## 2017-03-28 MED ORDER — SODIUM CHLORIDE 0.9% FLUSH: 3.0000 mL | INTRAVENOUS | Status: DC | PRN

## 2017-03-28 MED ORDER — ASPIRIN 81 MG PO CHEW
CHEWABLE_TABLET | ORAL | Status: DC | PRN
  Administered 2017-03-28: 243 mg via ORAL

## 2017-03-28 MED ORDER — LABETALOL HCL 5 MG/ML IV SOLN: 10.0000 mg | INTRAVENOUS | Status: AC | PRN

## 2017-03-28 MED ORDER — MORPHINE SULFATE (PF) 2 MG/ML IV SOLN
INTRAVENOUS | Status: AC
  Administered 2017-03-28: 2 mg via INTRAVENOUS
  Filled 2017-03-28: qty 1

## 2017-03-28 MED ORDER — FUROSEMIDE 10 MG/ML IJ SOLN
INTRAMUSCULAR | Status: DC | PRN
  Administered 2017-03-28: 40 mg via INTRAVENOUS

## 2017-03-28 MED ORDER — IPRATROPIUM-ALBUTEROL 0.5-2.5 (3) MG/3ML IN SOLN
RESPIRATORY_TRACT | Status: AC
  Administered 2017-03-28: 3 mL
  Filled 2017-03-28: qty 3

## 2017-03-28 MED ORDER — CLOPIDOGREL BISULFATE 75 MG PO TABS: 75.0000 mg | ORAL_TABLET | Freq: Every day | ORAL | Status: DC

## 2017-03-28 MED ORDER — ASPIRIN 81 MG PO CHEW
CHEWABLE_TABLET | ORAL | Status: AC
  Filled 2017-03-28: qty 3

## 2017-03-28 MED ORDER — CLOPIDOGREL BISULFATE 75 MG PO TABS
ORAL_TABLET | ORAL | Status: DC | PRN
  Administered 2017-03-28: 300 mg via ORAL

## 2017-03-28 MED ORDER — NITROGLYCERIN 2 % TD OINT
TOPICAL_OINTMENT | TRANSDERMAL | Status: AC
  Administered 2017-03-28: 10:00:00
  Filled 2017-03-28: qty 1

## 2017-03-28 MED ORDER — IPRATROPIUM-ALBUTEROL 0.5-2.5 (3) MG/3ML IN SOLN: 3.0000 mL | Freq: Four times a day (QID) | RESPIRATORY_TRACT | Status: DC

## 2017-03-28 MED ORDER — ASPIRIN 81 MG PO CHEW: 81.0000 mg | CHEWABLE_TABLET | Freq: Every day | ORAL | Status: DC

## 2017-03-28 MED ORDER — ASPIRIN 81 MG PO CHEW
81.0000 mg | CHEWABLE_TABLET | Freq: Every day | ORAL | Status: DC
  Administered 2017-03-29: 81 mg via ORAL
  Filled 2017-03-28: qty 1

## 2017-03-28 MED ORDER — SODIUM CHLORIDE 0.9% FLUSH
3.0000 mL | Freq: Two times a day (BID) | INTRAVENOUS | Status: DC
  Administered 2017-03-28 – 2017-03-29 (×2): 3 mL via INTRAVENOUS

## 2017-03-28 MED ORDER — ACETAMINOPHEN 325 MG PO TABS: 650.0000 mg | ORAL_TABLET | ORAL | Status: DC | PRN

## 2017-03-28 MED ORDER — CLOPIDOGREL BISULFATE 300 MG PO TABS
ORAL_TABLET | ORAL | Status: AC
  Filled 2017-03-28: qty 1

## 2017-03-28 MED ORDER — LABETALOL HCL 5 MG/ML IV SOLN: 10.0000 mg | INTRAVENOUS | Status: DC | PRN

## 2017-03-28 MED ORDER — NICOTINE 21 MG/24HR TD PT24
21.0000 mg | MEDICATED_PATCH | Freq: Every day | TRANSDERMAL | Status: DC
  Administered 2017-03-28 – 2017-03-29 (×2): 21 mg via TRANSDERMAL
  Filled 2017-03-28 (×2): qty 1

## 2017-03-28 MED ORDER — SODIUM CHLORIDE 0.9 % IV SOLN: 250.0000 mL | INTRAVENOUS | Status: DC | PRN

## 2017-03-28 MED ORDER — ATROPINE SULFATE 1 MG/10ML IJ SOSY
PREFILLED_SYRINGE | INTRAMUSCULAR | Status: AC
  Filled 2017-03-28: qty 10

## 2017-03-28 MED ORDER — NITROGLYCERIN 1 MG/10 ML FOR IR/CATH LAB
INTRA_ARTERIAL | Status: DC | PRN
  Administered 2017-03-28 (×2): 200 ug via INTRACORONARY

## 2017-03-28 MED ORDER — IPRATROPIUM-ALBUTEROL 0.5-2.5 (3) MG/3ML IN SOLN: 3.0000 mL | Freq: Four times a day (QID) | RESPIRATORY_TRACT | Status: DC | PRN

## 2017-03-28 MED ORDER — CLOPIDOGREL BISULFATE 75 MG PO TABS
75.0000 mg | ORAL_TABLET | Freq: Every day | ORAL | Status: DC
  Administered 2017-03-29: 75 mg via ORAL
  Filled 2017-03-28: qty 1

## 2017-03-28 MED ORDER — FENTANYL CITRATE (PF) 100 MCG/2ML IJ SOLN
INTRAMUSCULAR | Status: AC
  Filled 2017-03-28: qty 2

## 2017-03-28 MED ORDER — LIDOCAINE HCL (PF) 1 % IJ SOLN
INTRAMUSCULAR | Status: DC | PRN
  Administered 2017-03-28: 18 mL via SUBCUTANEOUS

## 2017-03-28 MED ORDER — NITROGLYCERIN 5 MG/ML IV SOLN
INTRAVENOUS | Status: AC
  Filled 2017-03-28: qty 10

## 2017-03-28 SURGICAL SUPPLY — 16 items
BALLN TREK RX 2.25X15 (BALLOONS) ×3
BALLOON TREK RX 2.25X15 (BALLOONS) ×1 IMPLANT
CATH INFINITI 5FR ANG PIGTAIL (CATHETERS) ×3 IMPLANT
CATH INFINITI 5FR JL4 (CATHETERS) ×3 IMPLANT
CATH INFINITI JR4 5F (CATHETERS) ×3 IMPLANT
CATH VISTA GUIDE 6FR XB3.5 (CATHETERS) ×3 IMPLANT
DEVICE CLOSURE MYNXGRIP 6/7F (Vascular Products) ×3 IMPLANT
DEVICE INFLAT 30 PLUS (MISCELLANEOUS) ×3 IMPLANT
KIT MANI 3VAL PERCEP (MISCELLANEOUS) ×3 IMPLANT
NEEDLE PERC 18GX7CM (NEEDLE) ×3 IMPLANT
PACK CARDIAC CATH (CUSTOM PROCEDURE TRAY) ×3 IMPLANT
SHEATH AVANTI 5FR X 11CM (SHEATH) ×3 IMPLANT
SHEATH AVANTI 6FR X 11CM (SHEATH) ×3 IMPLANT
STENT RESOLUTE ONYX 2.75X15 (Permanent Stent) ×3 IMPLANT
WIRE ASAHI PROWATER 180CM (WIRE) ×3 IMPLANT
WIRE EMERALD 3MM-J .035X150CM (WIRE) ×3 IMPLANT

## 2017-03-28 NOTE — OR Nursing (Signed)
Off bipap for 30 minutes 02 at 3 liters via [place] sats 93% , Dr Darrold Junker paged and aware of above.Marland KitchenMarland KitchenOK to transfer back to patient room per MD

## 2017-03-28 NOTE — Progress Notes (Signed)
[facility] - [place] at [facility]   PATIENT NAME: Jeremiah Chapman    MR#:  [id number]  DATE OF BIRTH:  1946-03-30  SUBJECTIVE:   Patient here with shortness of breath and ruled in by cardiac markers for a non-ST elevation MI. Seen in Recovery room after cath today. He has CAD- stent placed. Had hypoxia and bradycardia before procedure- started on bipap and given lasix injection.  REVIEW OF SYSTEMS:    Review of Systems  Constitutional: Negative for chills and fever.  HENT: Negative for congestion and tinnitus.   Eyes: Negative for blurred vision and double vision.  Respiratory: Positive for shortness of breath. Negative for cough and wheezing.   Cardiovascular: Negative for chest pain, orthopnea and PND.  Gastrointestinal: Negative for abdominal pain, diarrhea, nausea and vomiting.  Genitourinary: Negative for dysuria and hematuria.  Neurological: Negative for dizziness, sensory change and focal weakness.  All other systems reviewed and are negative.   Nutrition: Clear liquids Tolerating Diet: Yes Tolerating PT: Ambulatory  DRUG ALLERGIES:   Allergies  Allergen Reactions  . Sitagliptin Rash and Other (See Comments)    Januvia    VITALS:  Blood pressure 132/67, pulse 72, temperature 99.1 F (37.3 C), temperature source Oral, resp. rate (!) 24, height  (1.778 m), weight 96.1 kg (211 lb 13.8 oz), SpO2 99 %.  PHYSICAL EXAMINATION:   Physical Exam  GENERAL:  71 y.o.-year-old obese patient lying in bed in no acute distress.  EYES: Pupils equal, round, reactive to light and accommodation. No scleral icterus. Extraocular muscles intact.  HEENT: Head atraumatic, normocephalic. Oropharynx and nasopharynx clear.  NECK:  Supple, no jugular venous distention. No thyroid enlargement, no tenderness.  LUNGS: Prolonged inspiratory and expiratory phase, no wheezing, some crepitations. Positive for use of accessory muscles of respiration.  CARDIOVASCULAR: S1,  S2 normal. No murmurs, rubs, or gallops.  ABDOMEN: Soft, nontender, nondistended. Bowel sounds present. No organomegaly or mass.  EXTREMITIES: No cyanosis, clubbing or edema b/l.    NEUROLOGIC: Cranial nerves II through XII are intact. No focal Motor or sensory deficits b/l.   PSYCHIATRIC: The patient is alert and oriented x 3.  SKIN: No obvious rash, lesion, or ulcer.    LABORATORY PANEL:   CBC  Recent Labs Lab 03/28/17 0408  WBC 10.1  HGB 12.0*  HCT 34.8*  PLT 181   ------------------------------------------------------------------------------------------------------------------  Chemistries   Recent Labs Lab 03/27/17 0028 03/28/17 0408  NA 137 135  K 3.7 4.0  CL 102 104  CO2 26 26  GLUCOSE 83 116*  BUN 28* 28*  CREATININE 1.83* 1.72*  CALCIUM 8.9 8.7*  AST 95*  --   ALT 24  --   ALKPHOS 46  --   BILITOT 0.8  --    ------------------------------------------------------------------------------------------------------------------  Cardiac Enzymes  Recent Labs Lab 03/27/17 0028  TROPONINI 32.81*   ------------------------------------------------------------------------------------------------------------------  RADIOLOGY:  No results found.   ASSESSMENT AND PLAN:   71 year old male with past medical history of diabetes, hypertension, COPD with ongoing tobacco abuse, CHF, atrial fibrillation, gout who presented to the hospital due to shortness of breath and ruled in by cardiac markers for a non-ST elevation MI.  1. Non-ST elevation MI-patient has ruled in by cardiac markers as his troponins have trended upwards. -Patient presented with shortness of breath but no chest pain and shortness of breath is likely an anginal equivalent. -Continue aspirin, atorvastatin, heparin drip, Norvasc, Avapro.  Bradycardic and not on B-blocker for now.  -Seen by cardiology and  Cath done 03/28/17- DES in Mid left Circumflex.  2. Essential hypertension-continue Norvasc,  Avapro.   3. Diabetes type 2 without complication-continue sliding scale insulin.  4. COPD with ongoing tobacco abuse-no acute exacerbation. Continue duo nebs as needed and Dulera.  5. Acute respi failure Hypoxia     Ac systolic CHF   IV lasix given, watch in recovery room- if does not improve, may need to place in stepdown unit.   Echo.   All the records are reviewed and case discussed with Care Management/Social Worker. Management plans discussed with the patient, family and they are in agreement.  CODE STATUS: Full  DVT Prophylaxis: was on heparin drip, now may need lovenox [place].  TOTAL TIME TAKING CARE OF THIS PATIENT: 35 critical care minutes.   discussed with pt's family in recovery room  POSSIBLE D/C IN 1-2 DAYS, DEPENDING ON CLINICAL CONDITION.   Altamese Dilling M.D on 03/28/2017 at 1:15 PM  Between 7am to 6pm - Pager - [phone].  After 6pm go to [email or web address] - Social research officer, government  [employer] [place]  Office  [phone]  CC: Primary care physician; Dorothey Baseman, MD

## 2017-03-28 NOTE — Progress Notes (Signed)
Patient taken off of bipap and placed on 3l [place] per Dr. Darrold Junker request. Sats are 94-95% at this time.

## 2017-03-28 NOTE — Progress Notes (Signed)
*  PRELIMINARY RESULTS* Echocardiogram 2D Echocardiogram has been performed.  Jeremiah Chapman 03/28/2017, 1:55 PM

## 2017-03-28 NOTE — OR Nursing (Signed)
Received back from cath lab...procedure postponed r/t SOB....morphine 2 mg given IV Nitro past to chest wall .Marland Kitchen.40 Lasix given in cath lab...condom cath placed on patient, RT here placing on bipap doing ABG .Marland KitchenSmall volume nebulizer treatment with {svn To be by RT after ABG)..continue to monitor

## 2017-03-28 NOTE — Progress Notes (Signed)
ANTICOAGULATION CONSULT NOTE - Initial Consult  Pharmacy Consult for Heparin Indication: atrial fibrillation       Allergies  Allergen Reactions  . Sitagliptin Rash and Other (See Comments)    Januvia    Patient Measurements: Height:  (177.8 cm) Weight: 213 lb (96.6 kg) IBW/kg (Calculated) : 73 Heparin Dosing Weight: 92.7 kg  Vital Signs: Temp: 97.9 F (36.6 C) (04/28 0833) Temp Source: Oral (04/28 1321) BP: 115/50 (04/28 1321) Pulse Rate: 63 (04/28 1321)  Labs:  Recent Labs (last 2 labs)    Recent Labs  03/26/17 0837 03/26/17 1340 03/26/17 1756  HGB 13.9 --  --   HCT 40.9 --  --   PLT 235 --  --   APTT --  102* 86*  LABPROT --  14.6 --   INR --  1.13 --   HEPARINUNFRC --  1.20* --   CREATININE 2.14* --  --   TROPONINI 1.68* 11.34* 28.72*      Estimated Creatinine Clearance: 36.4 mL/min (A) (by C-G formula based on SCr of 2.14 mg/dL (H)).   Medical History:     Past Medical History:  Diagnosis Date  . Atrial fibrillation (HCC)   . CHF (congestive heart failure) (HCC)   . Diabetes mellitus without complication (HCC)   . Gout   . Hypercholesterolemia   . Hypertension    Assessment: 71 y/o M with a h/o atrial fibrillation on Eliquis (last dose 2100 4/27) admitted with bradycardia. After discussion with ED physician, go ahead with bolus per cardiology.   Goal of Therapy: Heparin level 0.3-0.7 units/ml aPTT 68-109seconds Monitor platelets by anticoagulation protocol: Yes  Plan: Both heparin level and aPTT therapeutic. Continue heparin at 1200 units/hr. Recheck aPTT/HL with AM labs.  4/30 @ 0400 aPTT/HL 68/0.32 therapeutic. Will continue current current rate and will recheck w/ am labs.  Thank you for this consult.  Thomasene Ripple, PharmD, BCPS Clinical Pharmacist 03/28/2017

## 2017-03-28 NOTE — Progress Notes (Signed)
Patient heart rate decreasing to 30s. Asymptomatic. MD notified. Verbal order to stop amlodipine. Will monitor.

## 2017-03-29 ENCOUNTER — Encounter: Payer: Self-pay | Admitting: *Deleted

## 2017-03-29 LAB — CBC
HEMATOCRIT: 33.9 % — AB (ref 40.0–52.0)
HEMOGLOBIN: 11.6 g/dL — AB (ref 13.0–18.0)
MCH: 32.8 pg (ref 26.0–34.0)
MCHC: 34.2 g/dL (ref 32.0–36.0)
MCV: 95.8 fL (ref 80.0–100.0)
Platelets: 198 10*3/uL (ref 150–440)
RBC: 3.54 MIL/uL — AB (ref 4.40–5.90)
RDW: 14.3 % (ref 11.5–14.5)
WBC: 10.4 10*3/uL (ref 3.8–10.6)

## 2017-03-29 LAB — GLUCOSE, CAPILLARY
GLUCOSE-CAPILLARY: 116 mg/dL — AB (ref 65–99)
GLUCOSE-CAPILLARY: 201 mg/dL — AB (ref 65–99)

## 2017-03-29 LAB — BASIC METABOLIC PANEL
ANION GAP: 9 (ref 5–15)
BUN: 33 mg/dL — ABNORMAL HIGH (ref 6–20)
CALCIUM: 8.7 mg/dL — AB (ref 8.9–10.3)
CO2: 25 mmol/L (ref 22–32)
Chloride: 102 mmol/L (ref 101–111)
Creatinine, Ser: 1.89 mg/dL — ABNORMAL HIGH (ref 0.61–1.24)
GFR, EST AFRICAN AMERICAN: 39 mL/min — AB (ref >60)
GFR, EST NON AFRICAN AMERICAN: 34 mL/min — AB (ref >60)
Glucose, Bld: 121 mg/dL — ABNORMAL HIGH (ref 65–99)
POTASSIUM: 3.7 mmol/L (ref 3.5–5.1)
Sodium: 136 mmol/L (ref 135–145)

## 2017-03-29 LAB — APTT: aPTT: 42 seconds — ABNORMAL HIGH (ref 24–36)

## 2017-03-29 MED ORDER — ASPIRIN 81 MG PO CHEW: 81.0000 mg | CHEWABLE_TABLET | Freq: Every day | ORAL | 1 refills | Status: DC

## 2017-03-29 MED ORDER — CLOPIDOGREL BISULFATE 75 MG PO TABS: 75.0000 mg | ORAL_TABLET | Freq: Every day | ORAL | 0 refills | Status: DC

## 2017-03-29 MED ORDER — METOPROLOL SUCCINATE ER 25 MG PO TB24
12.5000 mg | ORAL_TABLET | Freq: Every day | ORAL | Status: DC
  Administered 2017-03-29: 12.5 mg via ORAL
  Filled 2017-03-29: qty 1

## 2017-03-29 MED ORDER — METOPROLOL SUCCINATE ER 25 MG PO TB24: 12.5000 mg | ORAL_TABLET | Freq: Every day | ORAL | 1 refills | Status: DC

## 2017-03-29 MED ORDER — IRBESARTAN 300 MG PO TABS: 300.0000 mg | ORAL_TABLET | Freq: Every day | ORAL | 1 refills | Status: DC

## 2017-03-29 MED ORDER — GLYBURIDE 2.5 MG PO TABS: 2.5000 mg | ORAL_TABLET | Freq: Every day | ORAL | 1 refills | Status: DC

## 2017-03-29 MED ORDER — ATORVASTATIN CALCIUM 80 MG PO TABS: 80.0000 mg | ORAL_TABLET | Freq: Every day | ORAL | 1 refills | Status: DC

## 2017-03-29 NOTE — Progress Notes (Signed)
Pt discharged to home via wc.  Instructions given to pt.  Questions answered.  No distress on 2LO2 per [place]

## 2017-03-29 NOTE — Plan of Care (Signed)
Problem: Safety: Goal: Ability to remain free from injury will improve Outcome: Progressing No falls this stay, fall precautions in place, non skid socks when oob  Problem: Tissue Perfusion: Goal: Risk factors for ineffective tissue perfusion will decrease Plavix started   Problem: Activity: Goal: Ability to return to baseline activity level will improve Outcome: Not Progressing Remains SOB on exertion, 2LO2 per [place]   Problem: Cardiovascular: Goal: Ability to achieve and maintain adequate cardiovascular perfusion will improve Outcome: Progressing Cath site looks good, no swelling or bleeding

## 2017-03-29 NOTE — Plan of Care (Signed)
Problem: Pain Managment: Goal: General experience of comfort will improve Outcome: Completed/Met Date Met: 03/29/17 No complaints of pain this shift. Will continue to monitor.  Problem: Cardiovascular: Goal: Vascular access site(s) Level 0-1 will be maintained Outcome: Completed/Met Date Met: 03/29/17 Cath site to right groin remained free of bleeding/bruising. Will continue to monitor.

## 2017-03-29 NOTE — Progress Notes (Signed)
[facility] Cardiology  SUBJECTIVE: Patient states he feels much better, breathing better. No chest pain or shoulder pain.    Vitals:   03/28/17 1500 03/28/17 2011 03/29/17 0424 03/29/17 0758  BP: (!) 104/59 (!) 104/47 (!) 122/51 (!) 120/49  Pulse: 80 68 69 68  Resp: Temp: 99 F (37.2 C) 97.8 F (36.6 C) 98 F (36.7 C) 98.4 F (36.9 C)  TempSrc: Oral Oral Oral Oral  SpO2: 93% 97% 98% 100%  Weight:   94.5 kg (208 lb 6.4 oz)   Height:         Intake/Output Summary (Last 24 hours) at 03/29/17 0813 Last data filed at 03/29/17 0300  Gross per 24 hour  Intake          1551.13 ml  Output             1225 ml  Net           326.13 ml      PHYSICAL EXAM  General: Well developed, well nourished, in no acute distress HEENT:  Normocephalic and atramatic Neck:  No JVD.  Lungs: Clear bilaterally to auscultation  Heart: Irregularly irregular  Abdomen: Bowel sounds are positive, abdomen soft and non-tender  Msk:  Back normal, sitting upright in bed Extremities: No clubbing, cyanosis or edema.   Neuro: Alert and oriented X 3. Psych:  Good affect, responds appropriately   LABS: Basic Metabolic Panel:  Recent Labs  16/10/96 0408 03/29/17 0410  NA 135 136  K 4.0 3.7  CL 104 102  CO2 26 25  GLUCOSE 116* 121*  BUN 28* 33*  CREATININE 1.72* 1.89*  CALCIUM 8.7* 8.7*   Liver Function Tests:  Recent Labs  03/26/17 0837 03/27/17 0028  AST 39 95*  ALT 19 24  ALKPHOS 62 46  BILITOT 0.7 0.8  PROT 7.4 7.0  ALBUMIN 4.0 3.7   No results for input(s): LIPASE, AMYLASE in the last 72 hours. CBC:  Recent Labs  03/26/17 0837  03/28/17 0408 03/29/17 0410  WBC 11.0*  < > 10.1 10.4  NEUTROABS 8.7*  --   --   --   HGB 13.9  < > 12.0* 11.6*  HCT 40.9  < > 34.8* 33.9*  MCV 95.7  < > 97.6 95.8  PLT 235  < > 181 198  < > = values in this interval not displayed. Cardiac Enzymes:  Recent Labs  03/26/17 1340 03/26/17 1756 03/27/17 0028  TROPONINI 11.34* 28.72*  32.81*   BNP: Invalid input(s): POCBNP D-Dimer: No results for input(s): DDIMER in the last 72 hours. Hemoglobin A1C: No results for input(s): HGBA1C in the last 72 hours. Fasting Lipid Panel: No results for input(s): CHOL, HDL, LDLCALC, TRIG, CHOLHDL, LDLDIRECT in the last 72 hours. Thyroid Function Tests: No results for input(s): TSH, T4TOTAL, T3FREE, THYROIDAB in the last 72 hours.  Invalid input(s): FREET3 Anemia Panel: No results for input(s): VITAMINB12, FOLATE, FERRITIN, TIBC, IRON, RETICCTPCT in the last 72 hours.  No results found.   Echo 50-55%  TELEMETRY: atrial fibrillation, rate 72 bpm  ASSESSMENT AND PLAN:  Principal Problem:   Bradycardia Active Problems:   SOB (shortness of breath)   Elevated troponin   AKI (acute kidney injury) (HCC)    1. Non-STEMI, status post cardiac catheterization 03/28/17 with successful PCI, DES mid left circumflex 2. Atrial fibrillation, rate controlled, Eliquis held for cardiac catheterization 3. Bradycardia, resolved, rate 75 bpm  Recommendations: 1. Continue to hold Eliquis for now  2. Continue DAPT with aspirin and Plavix. 3. Resume metoprolol succinate 12.5 mg  4. Recommend follow-up as outpatient with [facility] cardiology; will discuss future plan to resume Eliquis  Jeremiah Ivanoff, PA-C 03/29/2017 8:13 AM

## 2017-03-29 NOTE — Care Management (Signed)
Does not appear to be discharging on cost prohibitive antiplatelet medications

## 2017-03-29 NOTE — Progress Notes (Signed)
SATURATION QUALIFICATIONS: (This note is used to comply with regulatory documentation for home oxygen)  Patient Saturations on Room Air at Rest = 98%  Patient Saturations on Room Air while Ambulating = 77%  Patient Saturations on 2Liters of oxygen while Ambulating = 94%  Please briefly explain why patient needs home oxygen:

## 2017-03-29 NOTE — Discharge Summary (Signed)
[facility] [place] [facility]   PATIENT NAME: Jeremiah Chapman    MR#:  [id number]  DATE OF BIRTH:  [birth year]  DATE OF ADMISSION:  03/26/2017 ADMITTING PHYSICIAN: Marguarite Arbour, MD  DATE OF DISCHARGE: 03/29/2017  PRIMARY CARE PHYSICIAN: Dorothey Baseman, MD    ADMISSION DIAGNOSIS:  NSTEMI (non-ST elevated myocardial infarction) (HCC) [I21.4] Bradycardia by electrocardiogram [R00.1] Acute renal failure, unspecified acute renal failure type (HCC) [N17.9] Dyspnea, unspecified type [R06.00] Acute on chronic congestive heart failure, unspecified heart failure type (HCC) [I50.9]  DISCHARGE DIAGNOSIS:  Principal Problem:   Bradycardia Active Problems:   SOB (shortness of breath)   Elevated troponin   AKI (acute kidney injury) (HCC)   NSTEMI  SECONDARY DIAGNOSIS:   Past Medical History:  Diagnosis Date  . Atrial fibrillation (HCC)   . CHF (congestive heart failure) (HCC)   . Diabetes mellitus without complication (HCC)   . Gout   . Hypercholesterolemia   . Hypertension     HOSPITAL COURSE:   71 year old male with past medical history of diabetes, hypertension, COPD with ongoing tobacco abuse, CHF, atrial fibrillation, gout who presented to the hospital due to shortness of breath and ruled in by cardiac markers for a non-ST elevation MI.  1. Non-ST elevation MI-patient has ruled in by cardiac markers as his troponins have trended upwards. -Patient presented with shortness of breath but no chest pain and shortness of breath is likely an anginal equivalent. -Continue aspirin, atorvastatin, heparin drip, Norvasc, Avapro.  Bradycardic and not on B-blocker for now.  -Seen by cardiology and Cath done 03/28/17- DES in Mid left Circumflex. - stable next day morning. Started on low dose betablocker. - ASA, plavix, Statin on d/c. - Follow in cardio clinic in 1 week.  2. Essential hypertension-   Stopped other meds and d/c on metoprolol, Avapro.    3. Diabetes type 2 without complication-continue sliding scale insulin.   Stable in hospital.    Advised diet control, stop all meds on d/c except glyburide small dose.  4. COPD with ongoing tobacco abuse-no acute exacerbation. Continue duo nebs as needed and Dulera.  5. Acute respi failure Hypoxia     Ac systolic CHF   IV lasix given, Improved.   Need oxygen on d/c.  DISCHARGE CONDITIONS:   Stable.  CONSULTS OBTAINED:  Treatment Team:  Marcina Millard, MD  DRUG ALLERGIES:   Allergies  Allergen Reactions  . Sitagliptin Rash and Other (See Comments)    Januvia    DISCHARGE MEDICATIONS:   Current Discharge Medication List    START taking these medications   Details  aspirin 81 MG chewable tablet Chew 1 tablet (81 mg total) by mouth daily. Qty: 30 tablet, Refills: 1    atorvastatin (LIPITOR) 80 MG tablet Take 1 tablet (80 mg total) by mouth daily at 6 PM. Qty: 30 tablet, Refills: 1    clopidogrel (PLAVIX) 75 MG tablet Take 1 tablet (75 mg total) by mouth daily with breakfast. Qty: 30 tablet, Refills: 0    glyBURIDE (DIABETA) 2.5 MG tablet Take 1 tablet (2.5 mg total) by mouth daily with breakfast. Qty: 30 tablet, Refills: 1    irbesartan (AVAPRO) 300 MG tablet Take 1 tablet (300 mg total) by mouth daily. Qty: 30 tablet, Refills: 1      CONTINUE these medications which have CHANGED   Details  metoprolol succinate (TOPROL-XL) 25 MG 24 hr tablet Take 0.5 tablets (12.5 mg total) by mouth daily. Qty: 30 tablet, Refills:  1      CONTINUE these medications which have NOT CHANGED   Details  budesonide-formoterol (SYMBICORT) 160-4.5 MCG/ACT inhaler Inhale 2 puffs into the lungs 2 (two) times daily.    gabapentin (NEURONTIN) 100 MG capsule Take 300 mg by mouth daily as needed (for numbness in feet).  Refills: 0    levalbuterol (XOPENEX HFA) 45 MCG/ACT inhaler Inhale 1 puff into the lungs every 4 (four) hours as needed for wheezing.      STOP taking these  medications     amLODipine-olmesartan (AZOR) 5-40 MG tablet      ELIQUIS 5 MG TABS tablet      glyBURIDE-metformin (GLUCOVANCE) 5-500 MG tablet      linagliptin (TRADJENTA) 5 MG TABS tablet      pioglitazone (ACTOS) 45 MG tablet      VICTOZA 18 MG/3ML SOPN          DISCHARGE INSTRUCTIONS:    Follow with cardiologist office in 1 week.  If you experience worsening of your admission symptoms, develop shortness of breath, life threatening emergency, suicidal or homicidal thoughts you must seek medical attention immediately by calling 911 or calling your MD immediately  if symptoms less severe.  You Must read complete instructions/literature along with all the possible adverse reactions/side effects for all the Medicines you take and that have been prescribed to you. Take any new Medicines after you have completely understood and accept all the possible adverse reactions/side effects.   Please note  You were cared for by a hospitalist during your hospital stay. If you have any questions about your discharge medications or the care you received while you were in the hospital after you are discharged, you can call the unit and asked to speak with the hospitalist on call if the hospitalist that took care of you is not available. Once you are discharged, your primary care physician will handle any further medical issues. Please note that NO REFILLS for any discharge medications will be authorized once you are discharged, as it is imperative that you return to your primary care physician (or establish a relationship with a primary care physician if you do not have one) for your aftercare needs so that they can reassess your need for medications and monitor your lab values.    Today   CHIEF COMPLAINT:   Chief Complaint  Patient presents with  . Shortness of Breath    HISTORY OF PRESENT ILLNESS:  Jeremiah Chapman  is a 71 y.o. male with a known history of  A-fib s/p recent ablation now  with worsening SOB. No CP. In ER, pt found to be markedly bradycardic with elevated troponin. No ischemic changes on EKG. He is now admitted. No fever. Denies N/V/D. Does note some LE edema  VITAL SIGNS:  Blood pressure (!) 120/49, pulse 95, temperature 98.4 F (36.9 C), temperature source Oral, resp. rate 17, height  (1.778 m), weight 94.5 kg (208 lb 6.4 oz), SpO2 94 %.  I/O:   Intake/Output Summary (Last 24 hours) at 03/29/17 1309 Last data filed at 03/29/17 1024  Gross per 24 hour  Intake          1797.13 ml  Output             1425 ml  Net           372.13 ml    PHYSICAL EXAMINATION:  GENERAL:  71 y.o.-year-old patient lying in the bed with no acute distress.  EYES: Pupils equal, round,  reactive to light and accommodation. No scleral icterus. Extraocular muscles intact.  HEENT: Head atraumatic, normocephalic. Oropharynx and nasopharynx clear.  NECK:  Supple, no jugular venous distention. No thyroid enlargement, no tenderness.  LUNGS: Normal breath sounds bilaterally, no wheezing, rales,rhonchi or crepitation. No use of accessory muscles of respiration.  CARDIOVASCULAR: S1, S2 normal. No murmurs, rubs, or gallops.  ABDOMEN: Soft, non-tender, non-distended. Bowel sounds present. No organomegaly or mass.  EXTREMITIES: No pedal edema, cyanosis, or clubbing.  NEUROLOGIC: Cranial nerves II through XII are intact. Muscle strength 5/5 in all extremities. Sensation intact. Gait not checked.  PSYCHIATRIC: The patient is alert and oriented x 3.  SKIN: No obvious rash, lesion, or ulcer.   DATA REVIEW:   CBC  Recent Labs Lab 03/29/17 0410  WBC 10.4  HGB 11.6*  HCT 33.9*  PLT 198    Chemistries   Recent Labs Lab 03/27/17 0028  03/29/17 0410  NA 137  < > 136  K 3.7  < > 3.7  CL 102  < > 102  CO2 26  < > 25  GLUCOSE 83  < > 121*  BUN 28*  < > 33*  CREATININE 1.83*  < > 1.89*  CALCIUM 8.9  < > 8.7*  AST 95*  --   --   ALT 24  --   --   ALKPHOS 46  --   --    BILITOT 0.8  --   --   < > = values in this interval not displayed.  Cardiac Enzymes  Recent Labs Lab 03/27/17 0028  TROPONINI 32.81*    Microbiology Results  No results found for this or any previous visit.  RADIOLOGY:  No results found.  EKG:   Orders placed or performed during the hospital encounter of 03/26/17  . EKG 12-Lead  . EKG 12-Lead  . EKG 12-Lead immediately post procedure  . EKG 12-Lead  . EKG 12-Lead immediately post procedure  . EKG 12-Lead  . EKG 12-Lead immediately post procedure  . EKG 12-Lead  . EKG 12-Lead  . EKG 12-Lead immediately post procedure  . EKG 12-Lead  . EKG 12-Lead      Management plans discussed with the patient, family and they are in agreement.  CODE STATUS:     Code Status Orders        Start     Ordered   03/26/17 1320  Full code  Continuous     03/26/17 1319    Code Status History    Date Active Date Inactive Code Status Order ID Comments User Context   This patient has a current code status but no historical code status.      TOTAL TIME TAKING CARE OF THIS PATIENT: 35 minutes.    Altamese Dilling M.D on 03/29/2017 at 1:09 PM  Between 7am to 6pm - Pager - [phone]  After 6pm go to [email or web address] - password [email or web address]  [facility]  Office  [phone]  CC: Primary care physician; Dorothey Baseman, MD   Note: This dictation was prepared with Dragon dictation along with smaller phrase technology. Any transcriptional errors that result from this process are unintentional.

## 2017-03-29 NOTE — Care Management Note (Signed)
Case Management Note  Patient Details  Name: Jeremiah Chapman MRN: [id number] Date of Birth: Mar 18, 1946  Subjective/Objective:               Patient has qualified for home 02.  Given list of DME agencies for preference.and chose [facility].  [employer]quest the portable concentrator  .  Denies need for home health nurse.  Action/Plan:  Referral  called and accepted by [facility]  Expected Discharge Date:  03/29/17               Expected Discharge Plan:     In-House Referral:     Discharge planning Services     Post Acute Care Choice:    Choice offered to:     DME Arranged:    DME Agency:     HH Arranged:    HH Agency:     Status of Service:     If discussed at [employer], dates discussed:    Additional Comments:  Eber Hong, RN 03/29/2017, 1:48 PM

## 2017-04-05 DIAGNOSIS — Z9889 Other specified postprocedural states: Secondary | ICD-10-CM | POA: Insufficient documentation

## 2017-04-21 ENCOUNTER — Encounter: Payer: Self-pay | Admitting: Emergency Medicine

## 2017-04-21 ENCOUNTER — Inpatient Hospital Stay
Admission: EM | Admit: 2017-04-21 | Discharge: 2017-04-23 | DRG: 193 | Disposition: A | Discharge status: Home / Self Care | Payer: Medicare Other | Attending: Internal Medicine | Admitting: Internal Medicine

## 2017-04-21 ENCOUNTER — Emergency Department: Payer: Medicare Other

## 2017-04-21 DIAGNOSIS — M109 Gout, unspecified: Secondary | ICD-10-CM | POA: Diagnosis present

## 2017-04-21 DIAGNOSIS — Z7902 Long term (current) use of antithrombotics/antiplatelets: Secondary | ICD-10-CM | POA: Diagnosis not present

## 2017-04-21 DIAGNOSIS — J440 Chronic obstructive pulmonary disease with acute lower respiratory infection: Secondary | ICD-10-CM | POA: Diagnosis present

## 2017-04-21 DIAGNOSIS — Z9981 Dependence on supplemental oxygen: Secondary | ICD-10-CM | POA: Diagnosis not present

## 2017-04-21 DIAGNOSIS — R0602 Shortness of breath: Secondary | ICD-10-CM

## 2017-04-21 DIAGNOSIS — J189 Pneumonia, unspecified organism: Secondary | ICD-10-CM | POA: Diagnosis not present

## 2017-04-21 DIAGNOSIS — I110 Hypertensive heart disease with heart failure: Secondary | ICD-10-CM | POA: Diagnosis present

## 2017-04-21 DIAGNOSIS — F17290 Nicotine dependence, other tobacco product, uncomplicated: Secondary | ICD-10-CM | POA: Diagnosis present

## 2017-04-21 DIAGNOSIS — Z7951 Long term (current) use of inhaled steroids: Secondary | ICD-10-CM | POA: Diagnosis not present

## 2017-04-21 DIAGNOSIS — I482 Chronic atrial fibrillation: Secondary | ICD-10-CM | POA: Diagnosis present

## 2017-04-21 DIAGNOSIS — E785 Hyperlipidemia, unspecified: Secondary | ICD-10-CM | POA: Diagnosis present

## 2017-04-21 DIAGNOSIS — Z955 Presence of coronary angioplasty implant and graft: Secondary | ICD-10-CM

## 2017-04-21 DIAGNOSIS — E1165 Type 2 diabetes mellitus with hyperglycemia: Secondary | ICD-10-CM | POA: Diagnosis present

## 2017-04-21 DIAGNOSIS — Z7982 Long term (current) use of aspirin: Secondary | ICD-10-CM | POA: Diagnosis not present

## 2017-04-21 DIAGNOSIS — Z79899 Other long term (current) drug therapy: Secondary | ICD-10-CM

## 2017-04-21 DIAGNOSIS — R778 Other specified abnormalities of plasma proteins: Secondary | ICD-10-CM

## 2017-04-21 DIAGNOSIS — Z7984 Long term (current) use of oral hypoglycemic drugs: Secondary | ICD-10-CM

## 2017-04-21 DIAGNOSIS — I5031 Acute diastolic (congestive) heart failure: Secondary | ICD-10-CM | POA: Diagnosis present

## 2017-04-21 DIAGNOSIS — Z951 Presence of aortocoronary bypass graft: Secondary | ICD-10-CM

## 2017-04-21 DIAGNOSIS — I2510 Atherosclerotic heart disease of native coronary artery without angina pectoris: Secondary | ICD-10-CM | POA: Diagnosis present

## 2017-04-21 DIAGNOSIS — E7800 Pure hypercholesterolemia, unspecified: Secondary | ICD-10-CM | POA: Diagnosis present

## 2017-04-21 DIAGNOSIS — Z8249 Family history of ischemic heart disease and other diseases of the circulatory system: Secondary | ICD-10-CM | POA: Diagnosis not present

## 2017-04-21 DIAGNOSIS — R0600 Dyspnea, unspecified: Secondary | ICD-10-CM

## 2017-04-21 DIAGNOSIS — R7989 Other specified abnormal findings of blood chemistry: Secondary | ICD-10-CM

## 2017-04-21 DIAGNOSIS — T380X5A Adverse effect of glucocorticoids and synthetic analogues, initial encounter: Secondary | ICD-10-CM | POA: Diagnosis present

## 2017-04-21 HISTORY — DX: Atherosclerotic heart disease of native coronary artery without angina pectoris: I25.10

## 2017-04-21 LAB — COMPREHENSIVE METABOLIC PANEL
ALK PHOS: 82 U/L (ref 38–126)
ALT: 14 U/L — ABNORMAL LOW (ref 17–63)
ANION GAP: 6 (ref 5–15)
AST: 20 U/L (ref 15–41)
Albumin: 3.7 g/dL (ref 3.5–5.0)
BILIRUBIN TOTAL: 1.2 mg/dL (ref 0.3–1.2)
BUN: 16 mg/dL (ref 6–20)
CALCIUM: 9 mg/dL (ref 8.9–10.3)
CO2: 31 mmol/L (ref 22–32)
Chloride: 100 mmol/L — ABNORMAL LOW (ref 101–111)
Creatinine, Ser: 1.6 mg/dL — ABNORMAL HIGH (ref 0.61–1.24)
GFR calc non Af Amer: 41 mL/min — ABNORMAL LOW (ref >60)
GFR, EST AFRICAN AMERICAN: 48 mL/min — AB (ref >60)
Glucose, Bld: 167 mg/dL — ABNORMAL HIGH (ref 65–99)
Potassium: 3.9 mmol/L (ref 3.5–5.1)
SODIUM: 137 mmol/L (ref 135–145)
TOTAL PROTEIN: 7.4 g/dL (ref 6.5–8.1)

## 2017-04-21 LAB — GLUCOSE, CAPILLARY
GLUCOSE-CAPILLARY: 297 mg/dL — AB (ref 65–99)
Glucose-Capillary: 378 mg/dL — ABNORMAL HIGH (ref 65–99)

## 2017-04-21 LAB — BRAIN NATRIURETIC PEPTIDE: B NATRIURETIC PEPTIDE 5: 1271 pg/mL — AB (ref 0.0–100.0)

## 2017-04-21 LAB — CBC WITH DIFFERENTIAL/PLATELET
BASOS PCT: 1 %
Basophils Absolute: 0.1 10*3/uL (ref 0–0.1)
EOS ABS: 0.3 10*3/uL (ref 0–0.7)
Eosinophils Relative: 4 %
HCT: 36 % — ABNORMAL LOW (ref 40.0–52.0)
Hemoglobin: 12.6 g/dL — ABNORMAL LOW (ref 13.0–18.0)
LYMPHS ABS: 1.2 10*3/uL (ref 1.0–3.6)
Lymphocytes Relative: 17 %
MCH: 32.7 pg (ref 26.0–34.0)
MCHC: 34.9 g/dL (ref 32.0–36.0)
MCV: 93.6 fL (ref 80.0–100.0)
Monocytes Absolute: 0.5 10*3/uL (ref 0.2–1.0)
Monocytes Relative: 7 %
Neutro Abs: 5.2 10*3/uL (ref 1.4–6.5)
Neutrophils Relative %: 71 %
PLATELETS: 216 10*3/uL (ref 150–440)
RBC: 3.85 MIL/uL — ABNORMAL LOW (ref 4.40–5.90)
RDW: 13.6 % (ref 11.5–14.5)
WBC: 7.4 10*3/uL (ref 3.8–10.6)

## 2017-04-21 LAB — TROPONIN I
TROPONIN I: 0.16 ng/mL — AB (ref <0.03)
TROPONIN I: 0.14 ng/mL — AB (ref <0.03)
Troponin I: 0.2 ng/mL (ref <0.03)

## 2017-04-21 LAB — TSH: TSH: 1.035 u[IU]/mL (ref 0.350–4.500)

## 2017-04-21 MED ORDER — ENOXAPARIN SODIUM 40 MG/0.4ML ~~LOC~~ SOLN
40.0000 mg | SUBCUTANEOUS | Status: DC
  Administered 2017-04-21 – 2017-04-22 (×2): 40 mg via SUBCUTANEOUS
  Filled 2017-04-21 (×2): qty 0.4

## 2017-04-21 MED ORDER — ASPIRIN 81 MG PO CHEW
81.0000 mg | CHEWABLE_TABLET | Freq: Every day | ORAL | Status: DC
Start: 2017-04-21 — End: 2017-04-23
  Administered 2017-04-21 – 2017-04-23 (×3): 81 mg via ORAL
  Filled 2017-04-21 (×3): qty 1

## 2017-04-21 MED ORDER — ATORVASTATIN CALCIUM 20 MG PO TABS
80.0000 mg | ORAL_TABLET | Freq: Every day | ORAL | Status: DC
  Administered 2017-04-21 – 2017-04-22 (×2): 80 mg via ORAL
  Filled 2017-04-21 (×2): qty 4

## 2017-04-21 MED ORDER — IPRATROPIUM-ALBUTEROL 0.5-2.5 (3) MG/3ML IN SOLN
3.0000 mL | Freq: Three times a day (TID) | RESPIRATORY_TRACT | Status: DC
  Administered 2017-04-22 – 2017-04-23 (×4): 3 mL via RESPIRATORY_TRACT
  Filled 2017-04-21 (×4): qty 3

## 2017-04-21 MED ORDER — IPRATROPIUM-ALBUTEROL 0.5-2.5 (3) MG/3ML IN SOLN
3.0000 mL | Freq: Four times a day (QID) | RESPIRATORY_TRACT | Status: DC
  Administered 2017-04-21 (×3): 3 mL via RESPIRATORY_TRACT
  Filled 2017-04-21 (×3): qty 3

## 2017-04-21 MED ORDER — SODIUM CHLORIDE 0.9% FLUSH
3.0000 mL | Freq: Two times a day (BID) | INTRAVENOUS | Status: DC
  Administered 2017-04-21 – 2017-04-23 (×5): 3 mL via INTRAVENOUS

## 2017-04-21 MED ORDER — INSULIN ASPART 100 UNIT/ML ~~LOC~~ SOLN
0.0000 [IU] | Freq: Three times a day (TID) | SUBCUTANEOUS | Status: DC
  Administered 2017-04-21 – 2017-04-23 (×3): 5 [IU] via SUBCUTANEOUS
  Filled 2017-04-21: qty 20
  Filled 2017-04-21 (×2): qty 5
  Filled 2017-04-21: qty 20
  Filled 2017-04-21: qty 5

## 2017-04-21 MED ORDER — SODIUM CHLORIDE 0.9 % IV SOLN: 250.0000 mL | INTRAVENOUS | Status: DC | PRN

## 2017-04-21 MED ORDER — ACETAMINOPHEN 325 MG PO TABS
650.0000 mg | ORAL_TABLET | Freq: Four times a day (QID) | ORAL | Status: DC | PRN
Start: 2017-04-21 — End: 2017-04-23

## 2017-04-21 MED ORDER — ONDANSETRON HCL 4 MG/2ML IJ SOLN: 4.0000 mg | Freq: Four times a day (QID) | INTRAMUSCULAR | Status: DC | PRN

## 2017-04-21 MED ORDER — CLOPIDOGREL BISULFATE 75 MG PO TABS
75.0000 mg | ORAL_TABLET | Freq: Every day | ORAL | Status: DC
  Administered 2017-04-22 – 2017-04-23 (×2): 75 mg via ORAL
  Filled 2017-04-21 (×2): qty 1

## 2017-04-21 MED ORDER — FLUTICASONE FUROATE-VILANTEROL 200-25 MCG/INH IN AEPB
1.0000 | INHALATION_SPRAY | Freq: Every day | RESPIRATORY_TRACT | Status: DC
  Administered 2017-04-22 – 2017-04-23 (×2): 1 via RESPIRATORY_TRACT
  Filled 2017-04-21: qty 28

## 2017-04-21 MED ORDER — FUROSEMIDE 10 MG/ML IJ SOLN
20.0000 mg | Freq: Two times a day (BID) | INTRAMUSCULAR | Status: DC
  Administered 2017-04-21 (×2): 20 mg via INTRAVENOUS
  Filled 2017-04-21: qty 4
  Filled 2017-04-21 (×2): qty 2

## 2017-04-21 MED ORDER — METHYLPREDNISOLONE SODIUM SUCC 125 MG IJ SOLR
60.0000 mg | Freq: Four times a day (QID) | INTRAMUSCULAR | Status: DC
  Administered 2017-04-21 – 2017-04-22 (×4): 60 mg via INTRAVENOUS
  Filled 2017-04-21 (×4): qty 2

## 2017-04-21 MED ORDER — LEVOFLOXACIN IN D5W 500 MG/100ML IV SOLN
500.0000 mg | INTRAVENOUS | Status: DC
  Administered 2017-04-21 – 2017-04-22 (×2): 500 mg via INTRAVENOUS
  Filled 2017-04-21 (×2): qty 100

## 2017-04-21 MED ORDER — GUAIFENESIN ER 600 MG PO TB12
600.0000 mg | ORAL_TABLET | Freq: Two times a day (BID) | ORAL | Status: DC
  Administered 2017-04-21 – 2017-04-23 (×5): 600 mg via ORAL
  Filled 2017-04-21 (×5): qty 1

## 2017-04-21 MED ORDER — GABAPENTIN 100 MG PO CAPS: 200.0000 mg | ORAL_CAPSULE | Freq: Every day | ORAL | Status: DC | PRN

## 2017-04-21 MED ORDER — ONDANSETRON HCL 4 MG PO TABS: 4.0000 mg | ORAL_TABLET | Freq: Four times a day (QID) | ORAL | Status: DC | PRN

## 2017-04-21 MED ORDER — IRBESARTAN 150 MG PO TABS
300.0000 mg | ORAL_TABLET | Freq: Every day | ORAL | Status: DC
  Administered 2017-04-21 – 2017-04-23 (×3): 300 mg via ORAL
  Filled 2017-04-21 (×3): qty 2

## 2017-04-21 MED ORDER — INSULIN ASPART 100 UNIT/ML ~~LOC~~ SOLN
0.0000 [IU] | Freq: Every day | SUBCUTANEOUS | Status: DC
  Administered 2017-04-21: 5 [IU] via SUBCUTANEOUS
  Administered 2017-04-22: 2 [IU] via SUBCUTANEOUS
  Filled 2017-04-21: qty 2
  Filled 2017-04-21: qty 5

## 2017-04-21 MED ORDER — GLYBURIDE 2.5 MG PO TABS
2.5000 mg | ORAL_TABLET | Freq: Every day | ORAL | Status: DC
  Administered 2017-04-22 – 2017-04-23 (×2): 2.5 mg via ORAL
  Filled 2017-04-21 (×2): qty 1

## 2017-04-21 MED ORDER — TIOTROPIUM BROMIDE MONOHYDRATE 18 MCG IN CAPS
18.0000 ug | ORAL_CAPSULE | Freq: Every day | RESPIRATORY_TRACT | Status: DC
  Administered 2017-04-21: 18 ug via RESPIRATORY_TRACT
  Filled 2017-04-21: qty 5

## 2017-04-21 MED ORDER — METOPROLOL SUCCINATE ER 25 MG PO TB24
12.5000 mg | ORAL_TABLET | Freq: Every day | ORAL | Status: DC
  Administered 2017-04-21 – 2017-04-22 (×2): 12.5 mg via ORAL
  Filled 2017-04-21 (×2): qty 1

## 2017-04-21 MED ORDER — ACETAMINOPHEN 650 MG RE SUPP
650.0000 mg | Freq: Four times a day (QID) | RECTAL | Status: DC | PRN
Start: 2017-04-21 — End: 2017-04-23

## 2017-04-21 MED ORDER — SODIUM CHLORIDE 0.9% FLUSH: 3.0000 mL | INTRAVENOUS | Status: DC | PRN

## 2017-04-21 NOTE — Consult Note (Signed)
[facility] Cardiology  CARDIOLOGY CONSULT NOTE  Patient ID: Jeremiah Chapman MRN: [id number] DOB/AGE: November 22, 1946 71 y.o.  Admit date: 04/21/2017 Referring Physician Allena Katz Primary Physician [facility] Primary Cardiologist Nashonda Limberg Reason for Consultation Elevated troponin  HPI: 71 year old gentleman referred for evaluation of elevated troponin. The patient was recently hospitalized 03/26/2017 with acute on chronic congestive heart failure and non-ST elevation myocardial infarction. Cardiac catheterization on 03/28/2017 revealed severe three-vessel coronary artery disease, including LIMA to LAD, patent SVG to OM 2 with LV ejection fraction of 30-35%. The patient underwent PCI with drug-eluting stent mid left circumflex with overall clinical improvement. The patient reports that he was doing well until approximately 2 days prior to admission when he started to experience difficulty shortness of breath. He presented to [facility] emergency room earlier today with chest x-ray demonstrating right lower lobe pneumonia. The patient currently denies chest pain. Initial labs were notable for elevated troponin of 0.2 compared to 32.8 on 03/27/2017.  Review of systems complete and found to be negative unless listed above     Past Medical History:  Diagnosis Date  . Atrial fibrillation (HCC)   . CAD (coronary artery disease)   . CHF (congestive heart failure) (HCC)   . Diabetes mellitus without complication (HCC)   . Gout   . Hypercholesterolemia   . Hypertension     Past Surgical History:  Procedure Laterality Date  . CARDIAC CATHETERIZATION    . CARDIOVERSION N/A 01/20/2017   Procedure: CARDIOVERSION;  Surgeon: Marcina Millard, MD;  Location: [facility] ORS;  Service: Cardiovascular;  Laterality: N/A;  . COLONOSCOPY    . CORONARY ARTERY BYPASS GRAFT    . CORONARY STENT INTERVENTION N/A 03/28/2017   Procedure: Coronary Stent Intervention;  Surgeon: Marcina Millard, MD;  Location: [facility] INVASIVE CV LAB;   Service: Cardiovascular;  Laterality: N/A;  . LEFT HEART CATH AND CORONARY ANGIOGRAPHY N/A 03/28/2017   Procedure: Left Heart Cath and Coronary Angiography;  Surgeon: Marcina Millard, MD;  Location: [facility] INVASIVE CV LAB;  Service: Cardiovascular;  Laterality: N/A;  . limbar back surgery    . PENILE PROSTHESIS IMPLANT      Prescriptions Prior to Admission  Medication Sig Dispense Refill Last Dose  . aspirin 81 MG chewable tablet Chew 1 tablet (81 mg total) by mouth daily. 30 tablet 1 04/20/2017 at 0800  . atorvastatin (LIPITOR) 80 MG tablet Take 1 tablet (80 mg total) by mouth daily at 6 PM. 30 tablet 1 04/20/2017 at 1800  . budesonide-formoterol (SYMBICORT) 160-4.5 MCG/ACT inhaler Inhale 2 puffs into the lungs 2 (two) times daily.   04/20/2017 at 2000  . clopidogrel (PLAVIX) 75 MG tablet Take 1 tablet (75 mg total) by mouth daily with breakfast. 30 tablet 0 04/20/2017 at 0800  . gabapentin (NEURONTIN) 100 MG capsule Take 200 mg by mouth daily as needed (for numbness in feet).   0 PRN at PRN  . glyBURIDE (DIABETA) 2.5 MG tablet Take 1 tablet (2.5 mg total) by mouth daily with breakfast. 30 tablet 1 04/20/2017 at 0800  . irbesartan (AVAPRO) 300 MG tablet Take 1 tablet (300 mg total) by mouth daily. 30 tablet 1 04/20/2017 at 0800  . metoprolol succinate (TOPROL-XL) 25 MG 24 hr tablet Take 0.5 tablets (12.5 mg total) by mouth daily. 30 tablet 1 04/20/2017 at 0800  . SPIRIVA HANDIHALER 18 MCG inhalation capsule Place 18 mcg into inhaler and inhale daily.  1 04/20/2017 at 0800   Social History   Social History  . Marital status: Divorced  Spouse name: N/A  . Number of children: N/A  . Years of education: N/A   Occupational History  . Not on file.   Social History Main Topics  . Smoking status: Former Smoker    Packs/day: 0.50    Years: 50.00    Types: Cigarettes  . Smokeless tobacco: Current User  . Alcohol use No  . Drug use: No  . Sexual activity: Not on file   Other Topics Concern   . Not on file   Social History Narrative  . No narrative on file    Family History  Problem Relation Age of Onset  . Hypertension Mother       Review of systems complete and found to be negative unless listed above      PHYSICAL EXAM  General: Well developed, well nourished, in no acute distress HEENT:  Normocephalic and atramatic Neck:  No JVD.  Lungs: Clear bilaterally to auscultation and percussion. Heart: HRRR . Normal S1 and S2 without gallops or murmurs.  Abdomen: Bowel sounds are positive, abdomen soft and non-tender  Msk:  Back normal, normal gait. Normal strength and tone for age. Extremities: No clubbing, cyanosis or edema.   Neuro: Alert and oriented X 3. Psych:  Good affect, responds appropriately  Labs:   Lab Results  Component Value Date   WBC 7.4 04/21/2017   HGB 12.6 (L) 04/21/2017   HCT 36.0 (L) 04/21/2017   MCV 93.6 04/21/2017   PLT 216 04/21/2017    Recent Labs Lab 04/21/17 0740  NA 137  K 3.9  CL 100*  CO2 31  BUN 16  CREATININE 1.60*  CALCIUM 9.0  PROT 7.4  BILITOT 1.2  ALKPHOS 82  ALT 14*  AST 20  GLUCOSE 167*   Lab Results  Component Value Date   TROPONINI 0.20 (HH) 04/21/2017   No results found for: CHOL No results found for: HDL No results found for: LDLCALC No results found for: TRIG No results found for: CHOLHDL No results found for: LDLDIRECT    Radiology: Dg Chest Portable 1 View  Result Date: 04/21/2017 CLINICAL DATA:  Shortness of breath. EXAM: PORTABLE CHEST 1 VIEW COMPARISON:  03/26/2017. FINDINGS: Prior CABG. Cardiomegaly with normal pulmonary vascularity. Right lower lobe atelectasis and/or infiltrate with small right pleural effusion. Small left pleural effusion also noted. IMPRESSION: 1. Prior CABG. Cardiomegaly with normal pulmonary vascularity. Heart size stable. 2. Right lower lobe atelectasis and/or infiltrate with small right pleural effusion. Small left pleural effusion also noted. Electronically Signed    By: Maisie Fushomas  Register   On: 04/21/2017 07:58   Dg Chest Port 1 View  Result Date: 03/26/2017 CLINICAL DATA:  Bradycardia.  Shortness breath. EXAM: PORTABLE CHEST 1 VIEW COMPARISON:  01/06/2014 FINDINGS: There is mild bilateral interstitial prominence. There is no focal consolidation or pleural effusion. There is no pneumothorax. Is stable cardiomegaly. 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 The osseous structures are unremarkable. IMPRESSION: Cardiomegaly with possible mild pulmonary vascular congestion. Electronically Signed   By: Elige KoHetal  Patel   On: 03/26/2017 09:00    EKG: Atrial fibrillation  ASSESSMENT AND PLAN:   1. Borderline elevated troponin, in the absence of chest pain, in the setting of pneumonia, with recent non-ST elevation myocardial infarction and successful DES the mid left circumflex 2. Right lower lobe pneumonia  Recommendations  1. Agree with current therapy 2. Defer full dose anticoagulation 3. Defer cardiac diagnostics at this time 4. Continue dual antiplatelet therapy  Signed: Marcina Millardlexander Lilana Blasko MD,PhD, [facility] 04/21/2017, 1:02  PM

## 2017-04-21 NOTE — ED Notes (Signed)
Date and time results received: 04/21/17 0831 (use smartphrase ".now" to insert current time)  Test: Troponin Critical Value: 0.2  Name of Provider Notified: Dr. Darnelle CatalanMalinda  No new orders at this time.

## 2017-04-21 NOTE — ED Provider Notes (Signed)
[facility] Emergency Department Provider Note   ____________________________________________   First MD Initiated Contact with Patient 04/21/17 0715     (approximate)  I have reviewed the triage vital signs and the nursing notes.   HISTORY  Chief Complaint Shortness of Breath    HPI Jeremiah Chapman is a 71 y.o. male who reports he had a stent placed in the end of April. He says since Tuesday he's been having use oxygen continually because he is short of breath. Normally he just uses it occasionally. He is also coughing and producing some thick gray phlegm. He has no chest tightness no increase in his baseline trace edema no fever. He quit smoking in April.   Past Medical History:  Diagnosis Date  . Atrial fibrillation (HCC)   . CHF (congestive heart failure) (HCC)   . Diabetes mellitus without complication (HCC)   . Gout   . Hypercholesterolemia   . Hypertension     Patient Active Problem List   Diagnosis Date Noted  . Bradycardia 03/26/2017  . Elevated troponin 03/26/2017  . AKI (acute kidney injury) (HCC) 03/26/2017  . SOB (shortness of breath) 04/15/2016  . Arteriosclerosis of coronary artery 10/21/2015  . HLD (hyperlipidemia) 02/12/2015  . Burning sensation of the foot 02/12/2015  . H/O adenomatous polyp of colon 01/08/2015  . Essential (primary) hypertension 10/14/2014  . Controlled type 2 diabetes mellitus without complication (HCC) 10/14/2014  . H/O coronary artery bypass surgery 06/13/2014  . Atrial fibrillation (HCC) 06/07/2014  . Gout 06/07/2014    Past Surgical History:  Procedure Laterality Date  . CARDIAC CATHETERIZATION    . CARDIOVERSION N/A 01/20/2017   Procedure: CARDIOVERSION;  Surgeon: Marcina MillardAlexander Paraschos, MD;  Location: [facility] ORS;  Service: Cardiovascular;  Laterality: N/A;  . COLONOSCOPY    . CORONARY ARTERY BYPASS GRAFT    . CORONARY STENT INTERVENTION N/A 03/28/2017   Procedure: Coronary Stent Intervention;   Surgeon: Marcina MillardAlexander Paraschos, MD;  Location: [facility] INVASIVE CV LAB;  Service: Cardiovascular;  Laterality: N/A;  . LEFT HEART CATH AND CORONARY ANGIOGRAPHY N/A 03/28/2017   Procedure: Left Heart Cath and Coronary Angiography;  Surgeon: Marcina MillardAlexander Paraschos, MD;  Location: [facility] INVASIVE CV LAB;  Service: Cardiovascular;  Laterality: N/A;  . limbar back surgery    . PENILE PROSTHESIS IMPLANT      Prior to Admission medications   Medication Sig Start Date End Date Taking? Authorizing Provider  SPIRIVA HANDIHALER 18 MCG inhalation capsule Place 18 mcg into inhaler and inhale daily. 04/11/17  Yes [provider]  aspirin 81 MG chewable tablet Chew 1 tablet (81 mg total) by mouth daily. 03/30/17   Altamese DillingVachhani, Vaibhavkumar, MD  atorvastatin (LIPITOR) 80 MG tablet Take 1 tablet (80 mg total) by mouth daily at 6 PM. 03/29/17   Altamese DillingVachhani, Vaibhavkumar, MD  budesonide-formoterol [facility](SYMBICORT) 160-4.5 MCG/ACT inhaler Inhale 2 puffs into the lungs 2 (two) times daily.    [provider]  clopidogrel (PLAVIX) 75 MG tablet Take 1 tablet (75 mg total) by mouth daily with breakfast. 03/30/17   Altamese DillingVachhani, Vaibhavkumar, MD  gabapentin (NEURONTIN) 100 MG capsule Take 300 mg by mouth daily as needed (for numbness in feet).  12/12/15   [provider]  glyBURIDE (DIABETA) 2.5 MG tablet Take 1 tablet (2.5 mg total) by mouth daily with breakfast. 03/29/17   Altamese DillingVachhani, Vaibhavkumar, MD  irbesartan (AVAPRO) 300 MG tablet Take 1 tablet (300 mg total) by mouth daily. 03/30/17   Altamese DillingVachhani, Vaibhavkumar, MD  levalbuterol [facility](XOPENEX HFA)  45 MCG/ACT inhaler Inhale 1 puff into the lungs every 4 (four) hours as needed for wheezing.    [provider]  metoprolol succinate (TOPROL-XL) 25 MG 24 hr tablet Take 0.5 tablets (12.5 mg total) by mouth daily. 03/30/17   Altamese Dilling, MD    Allergies Sitagliptin  No family history on file.  Social History Social History  Substance Use Topics  . Smoking status:  Current Every Day Smoker    Packs/day: 0.50    Years: 50.00    Types: Cigarettes  . Smokeless tobacco: Current User  . Alcohol use Not on file    Review of Systems  Constitutional: No fever/chills Eyes: No visual changes. ENT: No sore throat. Cardiovascular: Denies chest pain. Respiratory: Denies shortness of breath. Gastrointestinal: No abdominal pain.  No nausea, no vomiting.  No diarrhea.  No constipation. Genitourinary: Negative for dysuria. Musculoskeletal: Negative for back pain. Skin: Negative for rash. Neurological: Negative for headaches, focal weakness or numbness.   ____________________________________________   PHYSICAL EXAM:  VITAL SIGNS: ED Triage Vitals  Enc Vitals Group     BP 04/21/17 0705 (!) 172/91     Pulse Rate 04/21/17 0705 92     Resp 04/21/17 0705 (!) 22     Temp 04/21/17 0705 98.4 F (36.9 C)     Temp Source 04/21/17 0705 Oral     SpO2 04/21/17 0705 100 %     Weight 04/21/17 0706 208 lb (94.3 kg)     Height --      Head Circumference --      Peak Flow --      Pain Score --      Pain Loc --      Pain Edu? --      Excl. in GC? --     Constitutional: Alert and oriented. Well appearing and in no acute distress. Eyes: Conjunctivae are normal. PERRL. EOMI. Head: Atraumatic. Nose: No congestion/rhinnorhea. Mouth/Throat: Mucous membranes are moist.  Oropharynx non-erythematous. Neck: No stridor.  Cardiovascular: Normal rate, regular rhythm. Grossly normal heart sounds.  Good peripheral circulation. Respiratory: Normal respiratory effort.  No retractions. Lungs CTAB. Gastrointestinal: Soft and nontender. No distention. No abdominal bruits. No CVA tenderness. Musculoskeletal: No lower extremity tenderness nor edema.  No joint effusions. Neurologic:  Normal speech and language. No gross focal neurologic deficits are appreciated. No gait instability. Skin:  Skin is warm, dry and intact. No rash noted. Psychiatric: Mood and affect are normal.  Speech and behavior are normal.  ____________________________________________   LABS (all labs ordered are listed, but only abnormal results are displayed)  Labs Reviewed  COMPREHENSIVE METABOLIC PANEL - Abnormal; Notable for the following:       Result Value   Chloride 100 (*)    Glucose, Bld 167 (*)    Creatinine, Ser 1.60 (*)    ALT 14 (*)    GFR calc non Af Amer 41 (*)    GFR calc Af Amer 48 (*)    All other components within normal limits  BRAIN NATRIURETIC PEPTIDE - Abnormal; Notable for the following:    B Natriuretic Peptide 1,271.0 (*)    All other components within normal limits  TROPONIN I - Abnormal; Notable for the following:    Troponin I 0.20 (*)    All other components within normal limits  CBC WITH DIFFERENTIAL/PLATELET - Abnormal; Notable for the following:    RBC 3.85 (*)    Hemoglobin 12.6 (*)    HCT 36.0 (*)  All other components within normal limits   ____________________________________________  EKG  EKG read and interpreted by me shows normal sinus rhythm with frequent premature beats the amplitude of the EKG is very low it's difficult to tell if these are PVCs are not there are Q's inferiorly and flipped T's inferiorly no obvious acute changes. Amplitude is much lower than EKG from May 1. ____________________________________________  RADIOLOGY  Study Result   CLINICAL DATA:  Shortness of breath.  EXAM: PORTABLE CHEST 1 VIEW  COMPARISON:  03/26/2017.  FINDINGS: Prior CABG. Cardiomegaly with normal pulmonary vascularity. Right lower lobe atelectasis and/or infiltrate with small right pleural effusion. Small left pleural effusion also noted.  IMPRESSION: 1. Prior CABG. Cardiomegaly with normal pulmonary vascularity. Heart size stable.  2. Right lower lobe atelectasis and/or infiltrate with small right pleural effusion. Small left pleural effusion also noted.   Electronically Signed   By: Maisie Fus  Register   On: 04/21/2017  07:58    ____________________________________________   PROCEDURES  Procedure(s) performed:   Procedures  Critical Care performed:   ____________________________________________   INITIAL IMPRESSION / ASSESSMENT AND PLAN / ED COURSE  Pertinent labs & imaging results that were available during my care of the patient were reviewed by me and considered in my medical decision making (see chart for details).        ____________________________________________   FINAL CLINICAL IMPRESSION(S) / ED DIAGNOSES  Final diagnoses:  SOB (shortness of breath)  Dyspnea, unspecified type  Elevated troponin      NEW MEDICATIONS STARTED DURING THIS VISIT:  New Prescriptions   No medications on file     Note:  This document was prepared using Dragon voice recognition software and may include unintentional dictation errors.    Arnaldo Natal, MD 04/21/17 [phone]

## 2017-04-21 NOTE — Progress Notes (Signed)
Condomn cath placed per pt request. Pt is getting lasix and is having frequency and urgency. I will continue to assess.

## 2017-04-21 NOTE — H&P (Signed)
[employer] [facility] [place] [facility]   PATIENT NAME: Jeremiah Chapman    MR#:  [id number]  DATE OF BIRTH:  11/16/1946  DATE OF ADMISSION:  04/21/2017  PRIMARY CARE PHYSICIAN: [name], David, MD   REQUESTING/REFERRING PHYSICIAN: Dorothea GlassmanMalinda Paul MD  CHIEF COMPLAINT:   Chief Complaint  Patient presents with  . Shortness of Breath    HISTORY OF PRESENT ILLNESS: Jeremiah Chapman  is a 71 y.o. male with a known history of Coronary artery disease, atrial fibrillation, history of congestive heart failure, diabetes type 2, essential hypertension, gout and hyperlipidemi presenting to the emergency room complaining of shortness of breath and cough. Patient was recently hospitalized with shortness of breath and subsequently underwent cardiac catheter and had a stent placed. He reports that he was discharged home on when necessary oxygen. He states that he did well for a few days but then started having shortness of breath. He continued to wear his oxygen throughout the day and continued to have shortness of breath and cough. Patient came to the emergency room chest x-ray shows a right lower lobe infiltrate. He denies any chest pain or palpitations. States that he was using oxygen at nighttime to help him sleep but did not need to sleep propped up. He denies any fevers or chills PAST MEDICAL HISTORY:   Past Medical History:  Diagnosis Date  . Atrial fibrillation (HCC)   . CAD (coronary artery disease)   . CHF (congestive heart failure) (HCC)   . Diabetes mellitus without complication (HCC)   . Gout   . Hypercholesterolemia   . Hypertension     PAST SURGICAL HISTORY: Past Surgical History:  Procedure Laterality Date  . CARDIAC CATHETERIZATION    . CARDIOVERSION N/A 01/20/2017   Procedure: CARDIOVERSION;  Surgeon: Marcina MillardAlexander Paraschos, MD;  Location: [facility] ORS;  Service: Cardiovascular;  Laterality: N/A;  . COLONOSCOPY    . CORONARY ARTERY BYPASS GRAFT    . CORONARY STENT INTERVENTION  N/A 03/28/2017   Procedure: Coronary Stent Intervention;  Surgeon: Marcina MillardAlexander Paraschos, MD;  Location: [facility] INVASIVE CV LAB;  Service: Cardiovascular;  Laterality: N/A;  . LEFT HEART CATH AND CORONARY ANGIOGRAPHY N/A 03/28/2017   Procedure: Left Heart Cath and Coronary Angiography;  Surgeon: Marcina MillardAlexander Paraschos, MD;  Location: [facility] INVASIVE CV LAB;  Service: Cardiovascular;  Laterality: N/A;  . limbar back surgery    . PENILE PROSTHESIS IMPLANT      SOCIAL HISTORY:  Social History  Substance Use Topics  . Smoking status: Former Smoker    Packs/day: 0.50    Years: 50.00    Types: Cigarettes  . Smokeless tobacco: Current User  . Alcohol use No    FAMILY HISTORY:  Family History  Problem Relation Age of Onset  . Hypertension Mother     DRUG ALLERGIES:  Allergies  Allergen Reactions  . Sitagliptin Rash and Other (See Comments)    Januvia    REVIEW OF SYSTEMS:   CONSTITUTIONAL: No fever, fatigue or weakness.  EYES: No blurred or double vision.  EARS, NOSE, AND THROAT: No tinnitus or ear pain.  RESPIRATORY: Productive cough, positive shortness of breath, occasional wheezing or no hemoptysis.  CARDIOVASCULAR: No chest pain, orthopnea, left lower extremity edema.  GASTROINTESTINAL: No nausea, vomiting, diarrhea or abdominal pain.  GENITOURINARY: No dysuria, hematuria.  ENDOCRINE: No polyuria, nocturia,  HEMATOLOGY: No anemia, easy bruising or bleeding SKIN: No rash or lesion. MUSCULOSKELETAL: No joint pain or arthritis.  Swelling of the left lower extremity NEUROLOGIC: No tingling, numbness,  weakness.  PSYCHIATRY: No anxiety or depression.   MEDICATIONS AT HOME:  Prior to Admission medications   Medication Sig Start Date End Date Taking? Authorizing Provider  aspirin 81 MG chewable tablet Chew 1 tablet (81 mg total) by mouth daily. 03/30/17  Yes Altamese Dilling, MD  atorvastatin (LIPITOR) 80 MG tablet Take 1 tablet (80 mg total) by mouth daily at 6 PM. 03/29/17  Yes  Altamese Dilling, MD  budesonide-formoterol [facility]) 160-4.5 MCG/ACT inhaler Inhale 2 puffs into the lungs 2 (two) times daily.   Yes [provider]  clopidogrel (PLAVIX) 75 MG tablet Take 1 tablet (75 mg total) by mouth daily with breakfast. 03/30/17  Yes Altamese Dilling, MD  gabapentin (NEURONTIN) 100 MG capsule Take 200 mg by mouth daily as needed (for numbness in feet).  12/12/15  Yes [provider]  glyBURIDE (DIABETA) 2.5 MG tablet Take 1 tablet (2.5 mg total) by mouth daily with breakfast. 03/29/17  Yes Altamese Dilling, MD  irbesartan (AVAPRO) 300 MG tablet Take 1 tablet (300 mg total) by mouth daily. 03/30/17  Yes Altamese Dilling, MD  metoprolol succinate (TOPROL-XL) 25 MG 24 hr tablet Take 0.5 tablets (12.5 mg total) by mouth daily. 03/30/17  Yes Altamese Dilling, MD  SPIRIVA HANDIHALER 18 MCG inhalation capsule Place 18 mcg into inhaler and inhale daily. 04/11/17  Yes [provider]      PHYSICAL EXAMINATION:   VITAL SIGNS: Blood pressure (!) 149/75, pulse 81, temperature 98.4 F (36.9 C), temperature source Oral, resp. rate (!) 25, weight 208 lb (94.3 kg), SpO2 99 %.  GENERAL:  72 y.o.-year-old patient lying in the bed with no acute distress.  EYES: Pupils equal, round, reactive to light and accommodation. No scleral icterus. Extraocular muscles intact.  HEENT: Head atraumatic, normocephalic. Oropharynx and nasopharynx clear.  NECK:  Supple, no jugular venous distention. No thyroid enlargement, no tenderness.  LUNGS: Rhonchus breath sounds at the right base, okay wheezing both lung, no rales,rhonchi . No use of accessory muscles of respiration.  CARDIOVASCULAR: Irregularly irregular No murmurs, rubs, or gallops.  ABDOMEN: Soft, nontender, nondistended. Bowel sounds present. No organomegaly or mass.  EXTREMITIES: No pedal edema, cyanosis, or clubbing.  NEUROLOGIC: Cranial nerves II through XII are intact. Muscle strength 5/5 in  all extremities. Sensation intact. Gait not checked.  PSYCHIATRIC: The patient is alert and oriented x 3.  SKIN: No obvious rash, lesion, or ulcer.   LABORATORY PANEL:   CBC  Recent Labs Lab 04/21/17 0740  WBC 7.4  HGB 12.6*  HCT 36.0*  PLT 216  MCV 93.6  MCH 32.7  MCHC 34.9  RDW 13.6  LYMPHSABS 1.2  MONOABS 0.5  EOSABS 0.3  BASOSABS 0.1   ------------------------------------------------------------------------------------------------------------------  Chemistries   Recent Labs Lab 04/21/17 0740  NA 137  K 3.9  CL 100*  CO2 31  GLUCOSE 167*  BUN 16  CREATININE 1.60*  CALCIUM 9.0  AST 20  ALT 14*  ALKPHOS 82  BILITOT 1.2   ------------------------------------------------------------------------------------------------------------------ estimated creatinine clearance is 48.1 mL/min (A) (by C-G formula based on SCr of 1.6 mg/dL (H)). ------------------------------------------------------------------------------------------------------------------ No results for input(s): TSH, T4TOTAL, T3FREE, THYROIDAB in the last 72 hours.  Invalid input(s): FREET3   Coagulation profile No results for input(s): INR, PROTIME in the last 168 hours. ------------------------------------------------------------------------------------------------------------------- No results for input(s): DDIMER in the last 72 hours. -------------------------------------------------------------------------------------------------------------------  Cardiac Enzymes  Recent Labs Lab 04/21/17 0740  TROPONINI 0.20*   ------------------------------------------------------------------------------------------------------------------ Invalid input(s): POCBNP  ---------------------------------------------------------------------------------------------------------------  Urinalysis No results found  for: COLORURINE, APPEARANCEUR, LABSPEC, PHURINE, GLUCOSEU, HGBUR, BILIRUBINUR, KETONESUR,  PROTEINUR, UROBILINOGEN, NITRITE, LEUKOCYTESUR   RADIOLOGY: Dg Chest Portable 1 View  Result Date: 04/21/2017 CLINICAL DATA:  Shortness of breath. EXAM: PORTABLE CHEST 1 VIEW COMPARISON:  03/26/2017. FINDINGS: Prior CABG. Cardiomegaly with normal pulmonary vascularity. Right lower lobe atelectasis and/or infiltrate with small right pleural effusion. Small left pleural effusion also noted. IMPRESSION: 1. Prior CABG. Cardiomegaly with normal pulmonary vascularity. Heart size stable. 2. Right lower lobe atelectasis and/or infiltrate with small right pleural effusion. Small left pleural effusion also noted. Electronically Signed   By: Maisie Fus  Register   On: 04/21/2017 07:58    EKG: Orders placed or performed during the hospital encounter of 04/21/17  . ED EKG  . ED EKG  . EKG 12-Lead  . EKG 12-Lead    IMPRESSION AND PLAN: Patient is a 71 year old presenting with shortness of breath and cough  1. Shortness of breath suspect multifactorial Pneumonia- patient's WBC count is normal. At this time I'll treat him with IV Levaquin and obtain sputum culture Possible diastolic CHF exasperation will give him low-dose IV Lasix Likely underlying COPD with exasperation I will treated with nebulizers steroids and antibiotics  2. Coronary artery disease with recent stent Continue therapy with aspirin and Plavix as well as metoprolol  3. Hyperlipidemia unspecified continue Lipitor  4. Diabetes type 2 Place on sliding scale insulin continue oral glyburide  5. Miscellaneous Lovenox for DVT prophylaxis  All the records are reviewed and case discussed with ED provider. Management plans discussed with the patient, family and they are in agreement.  CODE STATUS: Code Status History    Date Active Date Inactive Code Status Order ID Comments User Context   03/26/2017  1:19 PM 03/29/2017  7:25 PM Full Code [id number]  Marguarite Arbour, MD Inpatient       TOTAL TIME TAKING CARE OF THIS PATIENT: .     Auburn Bilberry M.D on 04/21/2017 at 9:07 AM  Between 7am to 6pm - Pager - [phone]  After 6pm go to [email or web address] - password [email or web address] [facility]  [facility]  Office  [phone]  CC: Primary care physician; Dorothey Baseman, MD

## 2017-04-21 NOTE — ED Triage Notes (Signed)
Pt to triage via w/c with O2 in place at 3l/min via [place]; pt st stent placed in April, since Tuesday having to use his oxygen continuously instead of as needed as "feels as if something is wrong"

## 2017-04-21 NOTE — ED Notes (Signed)
REDS VEST READING= 40 CHEST RULER=5  VEST FITTING TASKS: POSTURE=sitting HEIGHT MARKER=t CENTER STRIP=shifted  COMMENTS:approved by [employer]

## 2017-04-22 LAB — GLUCOSE, CAPILLARY
GLUCOSE-CAPILLARY: 422 mg/dL — AB (ref 65–99)
GLUCOSE-CAPILLARY: 231 mg/dL — AB (ref 65–99)
Glucose-Capillary: 430 mg/dL — ABNORMAL HIGH (ref 65–99)
Glucose-Capillary: 273 mg/dL — ABNORMAL HIGH (ref 65–99)

## 2017-04-22 LAB — BASIC METABOLIC PANEL
Anion gap: 9 (ref 5–15)
BUN: 31 mg/dL — AB (ref 6–20)
CO2: 27 mmol/L (ref 22–32)
CREATININE: 1.47 mg/dL — AB (ref 0.61–1.24)
Calcium: 9 mg/dL (ref 8.9–10.3)
Chloride: 95 mmol/L — ABNORMAL LOW (ref 101–111)
GFR calc Af Amer: 53 mL/min — ABNORMAL LOW (ref >60)
GFR, EST NON AFRICAN AMERICAN: 46 mL/min — AB (ref >60)
GLUCOSE: 425 mg/dL — AB (ref 65–99)
Potassium: 4.1 mmol/L (ref 3.5–5.1)
SODIUM: 131 mmol/L — AB (ref 135–145)

## 2017-04-22 MED ORDER — PREDNISONE 50 MG PO TABS
50.0000 mg | ORAL_TABLET | Freq: Every day | ORAL | Status: DC
  Administered 2017-04-22 – 2017-04-23 (×2): 50 mg via ORAL
  Filled 2017-04-22 (×2): qty 1

## 2017-04-22 MED ORDER — INSULIN ASPART 100 UNIT/ML ~~LOC~~ SOLN
8.0000 [IU] | Freq: Three times a day (TID) | SUBCUTANEOUS | Status: DC
  Administered 2017-04-22 – 2017-04-23 (×3): 8 [IU] via SUBCUTANEOUS
  Filled 2017-04-22 (×3): qty 8

## 2017-04-22 MED ORDER — INSULIN ASPART 100 UNIT/ML ~~LOC~~ SOLN
20.0000 [IU] | Freq: Once | SUBCUTANEOUS | Status: AC
  Administered 2017-04-22: 20 [IU] via SUBCUTANEOUS

## 2017-04-22 MED ORDER — FUROSEMIDE 20 MG PO TABS
20.0000 mg | ORAL_TABLET | Freq: Two times a day (BID) | ORAL | Status: DC
  Administered 2017-04-22 – 2017-04-23 (×3): 20 mg via ORAL
  Filled 2017-04-22 (×3): qty 1

## 2017-04-22 MED ORDER — INSULIN ASPART 100 UNIT/ML ~~LOC~~ SOLN
20.0000 [IU] | Freq: Once | SUBCUTANEOUS | Status: AC
Start: 2017-04-22 — End: 2017-04-22
  Administered 2017-04-22: 20 [IU] via SUBCUTANEOUS

## 2017-04-22 MED ORDER — LEVOFLOXACIN 500 MG PO TABS: 500.0000 mg | ORAL_TABLET | Freq: Every day | ORAL | Status: DC

## 2017-04-22 NOTE — Progress Notes (Signed)
[facility]  SUBJECTIVE: I'm feeling better   Vitals:   04/21/17 1400 04/21/17 1953 04/21/17 2009 04/22/17 0459  BP:  118/89  135/64  Pulse:  99  (!) 103  Resp:  18  18  Temp:  98.2 F (36.8 C)  98 F (36.7 C)  TempSrc:  Oral  Oral  SpO2: 98% 99% 94% 93%  Weight:      Height:         Intake/Output Summary (Last 24 hours) at 04/22/17 0836 Last data filed at 04/22/17 0300  Gross per 24 hour  Intake              580 ml  Output             1200 ml  Net             -620 ml      PHYSICAL EXAM  General: Well developed, well nourished, in no acute distress HEENT:  Normocephalic and atramatic Neck:  No JVD.  Lungs: Clear bilaterally to auscultation and percussion. Heart: HRRR . Normal S1 and S2 without gallops or murmurs.  Abdomen: Bowel sounds are positive, abdomen soft and non-tender  Msk:  Back normal, normal gait. Normal strength and tone for age. Extremities: No clubbing, cyanosis or edema.   Neuro: Alert and oriented X 3. Psych:  Good affect, responds appropriately   LABS: Basic Metabolic Panel:  Recent Labs  40/98/1105/24/18 0740 04/22/17 0542  NA 137 131*  K 3.9 4.1  CL 100* 95*  CO2 31 27  GLUCOSE 167* 425*  BUN 16 31*  CREATININE 1.60* 1.47*  CALCIUM 9.0 9.0   Liver Function Tests:  Recent Labs  04/21/17 0740  AST 20  ALT 14*  ALKPHOS 82  BILITOT 1.2  PROT 7.4  ALBUMIN 3.7   No results for input(s): LIPASE, AMYLASE in the last 72 hours. CBC:  Recent Labs  04/21/17 0740  WBC 7.4  NEUTROABS 5.2  HGB 12.6*  HCT 36.0*  MCV 93.6  PLT 216   Cardiac Enzymes:  Recent Labs  04/21/17 0740 04/21/17 1449 04/21/17 1953  TROPONINI 0.20* 0.16* 0.14*   BNP: Invalid input(s): POCBNP D-Dimer: No results for input(s): DDIMER in the last 72 hours. Hemoglobin A1C: No results for input(s): HGBA1C in the last 72 hours. Fasting Lipid Panel: No results for input(s): CHOL, HDL, LDLCALC, TRIG, CHOLHDL, LDLDIRECT in the last 72 hours. Thyroid  Function Tests:  Recent Labs  04/21/17 1449  TSH 1.035   Anemia Panel: No results for input(s): VITAMINB12, FOLATE, FERRITIN, TIBC, IRON, RETICCTPCT in the last 72 hours.  Dg Chest Portable 1 View  Result Date: 04/21/2017 CLINICAL DATA:  Shortness of breath. EXAM: PORTABLE CHEST 1 VIEW COMPARISON:  03/26/2017. FINDINGS: Prior CABG. Cardiomegaly with normal pulmonary vascularity. Right lower lobe atelectasis and/or infiltrate with small right pleural effusion. Small left pleural effusion also noted. IMPRESSION: 1. Prior CABG. Cardiomegaly with normal pulmonary vascularity. Heart size stable. 2. Right lower lobe atelectasis and/or infiltrate with small right pleural effusion. Small left pleural effusion also noted. Electronically Signed   By: Maisie Fushomas  Register   On: 04/21/2017 07:58     Echo LV ejection fraction 50-55%  TELEMETRY: Sinus rhythm:  ASSESSMENT AND PLAN:  Active Problems:   PNA (pneumonia)    1. Borderline elevated troponin, in the absence of chest pain, in the setting of pneumonia, likely not acute coronary syndrome 2. Right lower lobe pneumonia  Recommendations  1. Agree with current therapy 2. Defer  full dose anticoagulation 3. Defer further diagnostics  Sign off for now, please call if any questions   Marcina Millard, MD, PhD, [facility] 04/22/2017 8:36 AM

## 2017-04-22 NOTE — Progress Notes (Signed)
MD notified, pts CBG is >400. Orders to give 20 units novolog. I will continue to assess.

## 2017-04-22 NOTE — Care Management Important Message (Signed)
Important Message  Patient Details  Name: Jeremiah MoralesKenneth D Wilemon MRN: [id number] Date of Birth: 05/29/1946   Medicare Important Message Given:  Yes  Signed IM notice given    Eber HongGreene, Myrian Botello R, RN 04/22/2017, 9:24 AM

## 2017-04-22 NOTE — Progress Notes (Signed)
Pt has a CBG of 422 this a.m.pt complains of polydipsia, MD notified via text. I will await orders. I will continue to assess.

## 2017-04-22 NOTE — Progress Notes (Signed)
Inpatient Diabetes Program Recommendations  AACE/ADA: New Consensus Statement on Inpatient Glycemic Control (2015)  Target Ranges:  Prepandial:   less than 140 mg/dL      Peak postprandial:   less than 180 mg/dL (1-2 hours)      Critically ill patients:  140 - 180 mg/dL   Lab Results  Component Value Date   GLUCAP 422 (H) 04/22/2017    Review of Glycemic Control  Results for Jeremiah Chapman, Jeremiah Chapman (MRN [id number]) as of 04/22/2017 08:20  Ref. Range 04/21/2017 16:42 04/21/2017 20:53 04/22/2017 07:34  Glucose-Capillary Latest Ref Range: 65 - 99 mg/dL 409297 (H) 811378 (H) 914422 (H)    Diabetes history: Type 2 Outpatient Diabetes medications: Glyburide 2.5mg  qday  Current orders for Inpatient glycemic control: Glyburide 2.5mg , Novolog 0-9 units tid, Novolog 0-5 units qhs * Solu-medrol 60mg  q6h  Inpatient Diabetes Program Recommendations:   While the patient is on steroids, consider adding Lantus 14 units qday starting now, add Novolog 8 units tid with meals. Continue Novolog correction as ordered.   Chapman/C Glyburide while patient is in hospital.    Per ADA recommendations "consider performing an A1C on all patients with diabetes or hyperglycemia admitted to the hospital if not performed in the prior 3 months".  Text page to Dr. Erskine SquibbPatel  Jeremiah Safi, RN, BA, MHA, CDE Diabetes Coordinator Inpatient Diabetes Program  [phone] (Team Pager) [phone] [facility] Office) 04/22/2017 8:25 AM

## 2017-04-22 NOTE — Progress Notes (Signed)
[facility] - [place] at [facility]                                                                                                                                                                                  Patient Demographics   Jeremiah Chapman, is a 71 y.o. male, DOB - 04/26/1946, ZOX:[id number]  Admit date - 04/21/2017   Admitting Physician Auburn BilberryShreyang Kashawn Dirr, MD  Outpatient Primary MD for the patient is [name], David, MD   LOS - 1  Subjective: Patient admitted with shortness of breath and cough His breathing is improved. He denies any chest pains or palpitations    Review of Systems:   CONSTITUTIONAL: No documented fever. No fatigue, weakness. No weight gain, no weight loss.  EYES: No blurry or double vision.  ENT: No tinnitus. No postnasal drip. No redness of the oropharynx.  RESPIRATORY: No cough, no wheeze, no hemoptysis. No dyspnea.  CARDIOVASCULAR: No chest pain. No orthopnea. No palpitations. No syncope.  GASTROINTESTINAL: No nausea, no vomiting or diarrhea. No abdominal pain. No melena or hematochezia.  GENITOURINARY: No dysuria or hematuria.  ENDOCRINE: No polyuria or nocturia. No heat or cold intolerance.  HEMATOLOGY: No anemia. No bruising. No bleeding.  INTEGUMENTARY: No rashes. No lesions.  MUSCULOSKELETAL: No arthritis. No swelling. No gout.  NEUROLOGIC: No numbness, tingling, or ataxia. No seizure-type activity.  PSYCHIATRIC: No anxiety. No insomnia. No ADD.    Vitals:   Vitals:   04/21/17 1400 04/21/17 1953 04/21/17 2009 04/22/17 0459  BP:  118/89  135/64  Pulse:  99  (!) 103  Resp:  18  18  Temp:  98.2 F (36.8 C)  98 F (36.7 C)  TempSrc:  Oral  Oral  SpO2: 98% 99% 94% 93%  Weight:      Height:        Wt Readings from Last 3 Encounters:  04/21/17 207 lb 9.6 oz (94.2 kg)  03/29/17 208 lb 6.4 oz (94.5 kg)  01/20/17 212 lb (96.2 kg)     Intake/Output Summary (Last 24 hours) at 04/22/17 1329 Last data filed at 04/22/17  0914  Gross per 24 hour  Intake              820 ml  Output             1900 ml  Net            -1080 ml    Physical Exam:   GENERAL: Pleasant-appearing in no apparent distress.  HEAD, EYES, EARS, NOSE AND THROAT: Atraumatic, normocephalic. Extraocular muscles are intact. Pupils equal and reactive to light. Sclerae anicteric. No conjunctival injection. No oro-pharyngeal erythema.  NECK: Supple.  There is no jugular venous distention. No bruits, no lymphadenopathy, no thyromegaly.  HEART: Irregularly irregular. No murmurs, no rubs, no clicks.  LUNGS: Clear to auscultation bilaterally. No rales or rhonchi. No wheezes.  ABDOMEN: Soft, flat, nontender, nondistended. Has good bowel sounds. No hepatosplenomegaly appreciated.  EXTREMITIES: No evidence of any cyanosis, clubbing, or peripheral edema.  +2 pedal and radial pulses bilaterally.  NEUROLOGIC: The patient is alert, awake, and oriented x3 with no focal motor or sensory deficits appreciated bilaterally.  SKIN: Moist and warm with no rashes appreciated.  Psych: Not anxious, depressed LN: No inguinal LN enlargement    Antibiotics   Anti-infectives    Start     Dose/Rate Route Frequency Ordered Stop   04/21/17 1000  levofloxacin (LEVAQUIN) IVPB 500 mg     500 mg 100 mL/hr over 60 Minutes Intravenous Every 24 hours 04/21/17 0905        Medications   Scheduled Meds: . aspirin  81 mg Oral Daily  . atorvastatin  80 mg Oral q1800  . clopidogrel  75 mg Oral Q breakfast  . enoxaparin (LOVENOX) injection  40 mg Subcutaneous Q24H  . fluticasone furoate-vilanterol  1 puff Inhalation Daily  . furosemide  20 mg Oral BID  . glyBURIDE  2.5 mg Oral Q breakfast  . guaiFENesin  600 mg Oral BID  . insulin aspart  0-5 Units Subcutaneous QHS  . insulin aspart  0-9 Units Subcutaneous TID WC  . insulin aspart  8 Units Subcutaneous TID WC  . ipratropium-albuterol  3 mL Nebulization TID  . irbesartan  300 mg Oral Daily  . metoprolol succinate   12.5 mg Oral Daily  . predniSONE  50 mg Oral Q breakfast  . sodium chloride flush  3 mL Intravenous Q12H   Continuous Infusions: . sodium chloride    . levofloxacin (LEVAQUIN) IV Stopped (04/22/17 1140)   PRN Meds:.sodium chloride, acetaminophen **OR** acetaminophen, gabapentin, ondansetron **OR** ondansetron (ZOFRAN) IV, sodium chloride flush   Data Review:   Micro Results No results found for this or any previous visit (from the past 240 hour(s)).  Radiology Reports Dg Chest Portable 1 View  Result Date: 04/21/2017 CLINICAL DATA:  Shortness of breath. EXAM: PORTABLE CHEST 1 VIEW COMPARISON:  03/26/2017. FINDINGS: Prior CABG. Cardiomegaly with normal pulmonary vascularity. Right lower lobe atelectasis and/or infiltrate with small right pleural effusion. Small left pleural effusion also noted. IMPRESSION: 1. Prior CABG. Cardiomegaly with normal pulmonary vascularity. Heart size stable. 2. Right lower lobe atelectasis and/or infiltrate with small right pleural effusion. Small left pleural effusion also noted. Electronically Signed   By: Maisie Fus  Register   On: 04/21/2017 07:58   Dg Chest Port 1 View  Result Date: 03/26/2017 CLINICAL DATA:  Bradycardia.  Shortness breath. EXAM: PORTABLE CHEST 1 VIEW COMPARISON:  01/06/2014 FINDINGS: There is mild bilateral interstitial prominence. There is no focal consolidation or pleural effusion. There is no pneumothorax. Is stable cardiomegaly. 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 The osseous structures are unremarkable. IMPRESSION: Cardiomegaly with possible mild pulmonary vascular congestion. Electronically Signed   By: Elige Ko   On: 03/26/2017 09:00     CBC  Recent Labs Lab 04/21/17 0740  WBC 7.4  HGB 12.6*  HCT 36.0*  PLT 216  MCV 93.6  MCH 32.7  MCHC 34.9  RDW 13.6  LYMPHSABS 1.2  MONOABS 0.5  EOSABS 0.3  BASOSABS 0.1    Chemistries   Recent Labs Lab 04/21/17 0740 04/22/17 0542  NA 137 131*  K 3.9  4.1  CL 100* 95*  CO2 31 27   GLUCOSE 167* 425*  BUN 16 31*  CREATININE 1.60* 1.47*  CALCIUM 9.0 9.0  AST 20  --   ALT 14*  --   ALKPHOS 82  --   BILITOT 1.2  --    ------------------------------------------------------------------------------------------------------------------ estimated creatinine clearance is 52.4 mL/min (A) (by C-G formula based on SCr of 1.47 mg/dL (H)). ------------------------------------------------------------------------------------------------------------------ No results for input(s): HGBA1C in the last 72 hours. ------------------------------------------------------------------------------------------------------------------ No results for input(s): CHOL, HDL, LDLCALC, TRIG, CHOLHDL, LDLDIRECT in the last 72 hours. ------------------------------------------------------------------------------------------------------------------  Recent Labs  04/21/17 1449  TSH 1.035   ------------------------------------------------------------------------------------------------------------------ No results for input(s): VITAMINB12, FOLATE, FERRITIN, TIBC, IRON, RETICCTPCT in the last 72 hours.  Coagulation profile No results for input(s): INR, PROTIME in the last 168 hours.  No results for input(s): DDIMER in the last 72 hours.  Cardiac Enzymes  Recent Labs Lab 04/21/17 0740 04/21/17 1449 04/21/17 1953  TROPONINI 0.20* 0.16* 0.14*   ------------------------------------------------------------------------------------------------------------------ Invalid input(s): POCBNP    Assessment & Plan   Patient is a 71 year old presenting with shortness of breath and cough  1. 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 Coronary artery disease with recent stent Continue therapy with  aspirin and Plavix as well as metoprolol  3. Hyperlipidemia unspecified continue Lipitor  4. Diabetes type 2 continue therapy with glyburide blood sugars elevated due to steroids I will stop the steroids I will not initiate patient on insulin  5. Miscellaneous Lovenox for DVT prophylaxis     Code Status Orders        Start     Ordered   04/21/17 1046  Full code  Continuous     04/21/17 1045    Code Status History    Date Active Date Inactive Code Status Order ID Comments User Context   03/26/2017  1:19 PM 03/29/2017  7:25 PM Full Code [id number]  Marguarite Arbour, MD Inpatient           Consults  cardiology   DVT Prophylaxis  Lovenox   Lab Results  Component Value Date   PLT 216 04/21/2017     Time Spent in minutes   Greater than 50% of time spent in care coordination and counseling patient regarding the condition and plan of care.   Auburn Bilberry M.D on 04/22/2017 at 1:29 PM  Between 7am to 6pm - Pager - [phone]  After 6pm go to [email or web address] - password [email or web address] [email or web address]  [facility]   Office  [phone]

## 2017-04-22 NOTE — Care Management (Signed)
Chronic home oxygen through [employer].  Verbalizes understanding to have family bring portable for transport home. Independent in all adls, denies issues accessing medical care, obtaining medications or with transportation.  Current with  PCP.  No discharge needs identified at present by care manager or members of care team

## 2017-04-23 LAB — CBC
HCT: 34.2 % — ABNORMAL LOW (ref 40.0–52.0)
Hemoglobin: 11.9 g/dL — ABNORMAL LOW (ref 13.0–18.0)
MCH: 32.3 pg (ref 26.0–34.0)
MCHC: 34.8 g/dL (ref 32.0–36.0)
MCV: 92.6 fL (ref 80.0–100.0)
PLATELETS: 237 10*3/uL (ref 150–440)
RBC: 3.69 MIL/uL — ABNORMAL LOW (ref 4.40–5.90)
RDW: 13.9 % (ref 11.5–14.5)
WBC: 17.2 10*3/uL — ABNORMAL HIGH (ref 3.8–10.6)

## 2017-04-23 LAB — BASIC METABOLIC PANEL
ANION GAP: 8 (ref 5–15)
BUN: 43 mg/dL — ABNORMAL HIGH (ref 6–20)
CALCIUM: 9.1 mg/dL (ref 8.9–10.3)
CO2: 30 mmol/L (ref 22–32)
Chloride: 98 mmol/L — ABNORMAL LOW (ref 101–111)
Creatinine, Ser: 1.82 mg/dL — ABNORMAL HIGH (ref 0.61–1.24)
GFR, EST AFRICAN AMERICAN: 41 mL/min — AB (ref >60)
GFR, EST NON AFRICAN AMERICAN: 35 mL/min — AB (ref >60)
Glucose, Bld: 310 mg/dL — ABNORMAL HIGH (ref 65–99)
POTASSIUM: 3.9 mmol/L (ref 3.5–5.1)
SODIUM: 136 mmol/L (ref 135–145)

## 2017-04-23 LAB — GLUCOSE, CAPILLARY: GLUCOSE-CAPILLARY: 278 mg/dL — AB (ref 65–99)

## 2017-04-23 LAB — HEMOGLOBIN A1C
HEMOGLOBIN A1C: 7.3 % — AB (ref 4.8–5.6)
MEAN PLASMA GLUCOSE: 163 mg/dL

## 2017-04-23 MED ORDER — METOPROLOL TARTRATE 5 MG/5ML IV SOLN
5.0000 mg | Freq: Once | INTRAVENOUS | Status: AC
  Administered 2017-04-23: 5 mg via INTRAVENOUS
  Filled 2017-04-23: qty 5

## 2017-04-23 MED ORDER — INSULIN ASPART 100 UNIT/ML ~~LOC~~ SOLN: 8.0000 [IU] | Freq: Three times a day (TID) | SUBCUTANEOUS | 11 refills | Status: DC

## 2017-04-23 MED ORDER — METOPROLOL SUCCINATE ER 25 MG PO TB24
25.0000 mg | ORAL_TABLET | Freq: Every day | ORAL | Status: DC
  Administered 2017-04-23: 25 mg via ORAL
  Filled 2017-04-23: qty 1

## 2017-04-23 MED ORDER — METOPROLOL SUCCINATE ER 25 MG PO TB24: 25.0000 mg | ORAL_TABLET | Freq: Every day | ORAL | 1 refills | Status: DC

## 2017-04-23 MED ORDER — LEVOFLOXACIN 250 MG PO TABS
250.0000 mg | ORAL_TABLET | Freq: Every day | ORAL | Status: DC
  Administered 2017-04-23: 250 mg via ORAL
  Filled 2017-04-23: qty 1

## 2017-04-23 MED ORDER — LEVOFLOXACIN 500 MG PO TABS: 500.0000 mg | ORAL_TABLET | Freq: Every day | ORAL | 0 refills | Status: AC

## 2017-04-23 MED ORDER — DIGOXIN 125 MCG PO TABS: 0.1250 mg | ORAL_TABLET | Freq: Every day | ORAL | 0 refills | Status: DC

## 2017-04-23 MED ORDER — DIGOXIN 125 MCG PO TABS
0.1250 mg | ORAL_TABLET | Freq: Every day | ORAL | Status: DC
  Administered 2017-04-23: 0.125 mg via ORAL
  Filled 2017-04-23: qty 1

## 2017-04-23 NOTE — Progress Notes (Signed)
PHARMACY NOTE -  ANTIBIOTIC RENAL DOSE ADJUSTMENT    Patient has been initiated on levofloxacin 500mg  every 24 hours.   SCr has increased to 1.82, and estimated CrCl is now 42 ml/min  Pharmacy has adjusted the levofloxacin dose based on current renal function.   Patient is now receiving Levofloxacin 250mg  Every 24 hours.   Gardner CandleSheema M Halsey Persaud, PharmD, BCPS Clinical Pharmacist 04/23/2017 9:08 AM

## 2017-04-23 NOTE — Discharge Summary (Signed)
[facility] - [place] at [facility]  Jeremiah MoralesKenneth D Chapman, 71 y.o., DOB 08/29/1946, MRN [id number]. Admission date: 04/21/2017 Discharge Date 04/23/2017 Primary MD [name], David, MD Admitting Physician Auburn BilberryShreyang Memorie Yokoyama, MD  Admission Diagnosis  SOB (shortness of breath) [R06.02] Elevated troponin [R74.8] Dyspnea, unspecified type [R06.00]  Discharge Diagnosis   Active Problems:   PNA (pneumonia)   Possible diastolic chf  CAD  hyperlipidemia   DM2  chronic atrial fibrillation       Hospital Course    Arva ChafeKenneth Chapman  is a 71 y.o. male with a known history of Coronary artery disease, atrial fibrillation, history of congestive heart failure, diabetes type 2, essential hypertension, gout and hyperlipidemi presenting to the emergency room complaining of shortness of breath and cough. Patient was recently hospitalized with shortness of breath and subsequently underwent cardiac catheter and had a stent placed.  Pt presented with sob and ed noted to PNA. Pt was also noted to have possible systolic CHF. He was treated with antibiotics and Lasix. He was also treated for possible underlying COPD with nebulizers and steroids with significant improvement in symptoms. Patient was noted to uncontrolled atrial fibrillation which was treated with digoxin and increasing his metoprolol. Patient was also seen by cardiology. He is doing well and wants to go home         Consults  cardiology  Significant Tests:  See full reports for all details     Dg Chest Portable 1 View  Result Date: 04/21/2017 CLINICAL DATA:  Shortness of breath. EXAM: PORTABLE CHEST 1 VIEW COMPARISON:  03/26/2017. FINDINGS: Prior CABG. Cardiomegaly with normal pulmonary vascularity. Right lower lobe atelectasis and/or infiltrate with small right pleural effusion. Small left pleural effusion also noted. IMPRESSION: 1. Prior CABG. Cardiomegaly with normal pulmonary vascularity. Heart size stable. 2. Right lower lobe  atelectasis and/or infiltrate with small right pleural effusion. Small left pleural effusion also noted. Electronically Signed   By: Maisie Fushomas  Register   On: 04/21/2017 07:58   Dg Chest Port 1 View  Result Date: 03/26/2017 CLINICAL DATA:  Bradycardia.  Shortness breath. EXAM: PORTABLE CHEST 1 VIEW COMPARISON:  01/06/2014 FINDINGS: There is mild bilateral interstitial prominence. There is no focal consolidation or pleural effusion. There is no pneumothorax. Is stable cardiomegaly. 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 The osseous structures are unremarkable. IMPRESSION: Cardiomegaly with possible mild pulmonary vascular congestion. Electronically Signed   By: Elige KoHetal  Keslyn Teater   On: 03/26/2017 09:00       Today   Subjective:   Arva ChafeKenneth Chapman  Feeling betterwants to go home  Objective:   Blood pressure 133/83, pulse 83, temperature 97.8 F (36.6 C), temperature source Oral, resp. rate 20, height 5\' 10"  (1.778 m), weight 207 lb 9.6 oz (94.2 kg), SpO2 95 %.  .  Intake/Output Summary (Last 24 hours) at 04/23/17 1453 Last data filed at 04/23/17 1030  Gross per 24 hour  Intake              240 ml  Output              650 ml  Net             -410 ml    Exam VITAL SIGNS: Blood pressure 133/83, pulse 83, temperature 97.8 F (36.6 C), temperature source Oral, resp. rate 20, height 5\' 10"  (1.778 m), weight 207 lb 9.6 oz (94.2 kg), SpO2 95 %.  GENERAL:  71 y.o.-year-old patient lying in the bed with no acute distress.  EYES: Pupils  equal, round, reactive to light and accommodation. No scleral icterus. Extraocular muscles intact.  HEENT: Head atraumatic, normocephalic. Oropharynx and nasopharynx clear.  NECK:  Supple, no jugular venous distention. No thyroid enlargement, no tenderness.  LUNGS: Normal breath sounds bilaterally, no wheezing, rales,rhonchi or crepitation. No use of accessory muscles of respiration.  CARDIOVASCULAR: S1, S2 normal. No murmurs, rubs, or gallops.  ABDOMEN: Soft, nontender,  nondistended. Bowel sounds present. No organomegaly or mass.  EXTREMITIES: No pedal edema, cyanosis, or clubbing.  NEUROLOGIC: Cranial nerves II through XII are intact. Muscle strength 5/5 in all extremities. Sensation intact. Gait not checked.  PSYCHIATRIC: The patient is alert and oriented x 3.  SKIN: No obvious rash, lesion, or ulcer.   Data Review     CBC w Diff: Lab Results  Component Value Date   WBC 17.2 (H) 04/23/2017   HGB 11.9 (L) 04/23/2017   HGB 15.3 01/06/2014   HCT 34.2 (L) 04/23/2017   HCT 43.3 01/06/2014   PLT 237 04/23/2017   PLT 239 01/06/2014   LYMPHOPCT 17 04/21/2017   MONOPCT 7 04/21/2017   EOSPCT 4 04/21/2017   BASOPCT 1 04/21/2017   CMP: Lab Results  Component Value Date   NA 136 04/23/2017   NA 135 (L) 01/06/2014   K 3.9 04/23/2017   K 3.6 01/06/2014   CL 98 (L) 04/23/2017   CL 101 01/06/2014   CO2 30 04/23/2017   CO2 28 01/06/2014   BUN 43 (H) 04/23/2017   BUN 16 01/06/2014   CREATININE 1.82 (H) 04/23/2017   CREATININE 1.57 (H) 01/06/2014   PROT 7.4 04/21/2017   ALBUMIN 3.7 04/21/2017   BILITOT 1.2 04/21/2017   ALKPHOS 82 04/21/2017   AST 20 04/21/2017   ALT 14 (L) 04/21/2017  .  Micro Results No results found for this or any previous visit (from the past 240 hour(s)).      Code Status Orders        Start     Ordered   04/21/17 1046  Full code  Continuous     04/21/17 1045    Code Status History    Date Active Date Inactive Code Status Order ID Comments User Context   03/26/2017  1:19 PM 03/29/2017  7:25 PM Full Code [id number]  Marguarite Arbour, MD Inpatient          Follow-up Information    Paraschos, Alexander, MD Follow up in 1 week(s).   Specialty:  Cardiology Why:  afib Contact information: [address] [facility] [place] [address] [phone]        Dorothey Baseman, MD Follow up in 2 week(s).   Specialty:  Family Medicine Why:  hsop f/u Contact information: [address] [place] [address] [phone]           Discharge Medications   Allergies as of 04/23/2017      Reactions   Sitagliptin Rash, Other (See Comments)   Januvia      Medication List    TAKE these medications   aspirin 81 MG chewable tablet Chew 1 tablet (81 mg total) by mouth daily.   atorvastatin 80 MG tablet Commonly known as:  LIPITOR Take 1 tablet (80 mg total) by mouth daily at 6 PM.   budesonide-formoterol 160-4.5 MCG/ACT inhaler Commonly known as:  SYMBICORT Inhale 2 puffs into the lungs 2 (two) times daily.   clopidogrel 75 MG tablet Commonly known as:  PLAVIX Take 1 tablet (75 mg total) by  mouth daily with breakfast.   digoxin 0.125 MG tablet Commonly known as:  LANOXIN Take 1 tablet (0.125 mg total) by mouth daily.   gabapentin 100 MG capsule Commonly known as:  NEURONTIN Take 200 mg by mouth daily as needed (for numbness in feet).   glyBURIDE 2.5 MG tablet Commonly known as:  DIABETA Take 1 tablet (2.5 mg total) by mouth daily with breakfast.   insulin aspart 100 UNIT/ML injection Commonly known as:  novoLOG Inject 8 Units into the skin 3 (three) times daily with meals.   irbesartan 300 MG tablet Commonly known as:  AVAPRO Take 1 tablet (300 mg total) by mouth daily.   levofloxacin 500 MG tablet Commonly known as:  LEVAQUIN Take 1 tablet (500 mg total) by mouth daily.   metoprolol succinate 25 MG 24 hr tablet Commonly known as:  TOPROL-XL Take 1 tablet (25 mg total) by mouth daily. What changed:  how much to take   SPIRIVA HANDIHALER 18 MCG inhalation capsule Generic drug:  tiotropium Place 18 mcg into inhaler and inhale daily.          Total Time in preparing paper work, data evaluation and todays exam - 35 minutes  Auburn Bilberry M.D on 04/23/2017 at 2:53 PM  [facility]   Office  [phone]

## 2017-04-23 NOTE — Discharge Instructions (Signed)
[facility] - [place] at [facility]  DIET:  Diabetic diet, cardiac diet  DISCHARGE CONDITION:  Stable  ACTIVITY:  Activity as tolerated  OXYGEN:  Home Oxygen:  yest   Oxygen Delivery: 2l continous  DISCHARGE LOCATION:  home    ADDITIONAL DISCHARGE INSTRUCTION:   If you experience worsening of your admission symptoms, develop shortness of breath, life threatening emergency, suicidal or homicidal thoughts you must seek medical attention immediately by calling 911 or calling your MD immediately  if symptoms less severe.  You Must read complete instructions/literature along with all the possible adverse reactions/side effects for all the Medicines you take and that have been prescribed to you. Take any new Medicines after you have completely understood and accpet all the possible adverse reactions/side effects.   Please note  You were cared for by a hospitalist during your hospital stay. If you have any questions about your discharge medications or the care you received while you were in the hospital after you are discharged, you can call the unit and asked to speak with the hospitalist on call if the hospitalist that took care of you is not available. Once you are discharged, your primary care physician will handle any further medical issues. Please note that NO REFILLS for any discharge medications will be authorized once you are discharged, as it is imperative that you return to your primary care physician (or establish a relationship with a primary care physician if you do not have one) for your aftercare needs so that they can reassess your need for medications and monitor your lab values.

## 2017-05-18 ENCOUNTER — Ambulatory Visit (INDEPENDENT_AMBULATORY_CARE_PROVIDER_SITE_OTHER): Payer: Medicare Other

## 2017-05-18 ENCOUNTER — Encounter: Payer: Self-pay | Admitting: Podiatry

## 2017-05-18 ENCOUNTER — Ambulatory Visit (INDEPENDENT_AMBULATORY_CARE_PROVIDER_SITE_OTHER): Payer: Medicare Other | Admitting: Podiatry

## 2017-05-18 VITALS — BP 122/63 | HR 73 | Temp 98.1°F | Resp 16

## 2017-05-18 DIAGNOSIS — L02611 Cutaneous abscess of right foot: Secondary | ICD-10-CM

## 2017-05-18 DIAGNOSIS — L03031 Cellulitis of right toe: Secondary | ICD-10-CM | POA: Diagnosis not present

## 2017-05-18 DIAGNOSIS — M109 Gout, unspecified: Secondary | ICD-10-CM

## 2017-05-18 MED ORDER — COLCHICINE 0.6 MG PO TABS: ORAL_TABLET | ORAL | 3 refills | Status: DC

## 2017-05-19 ENCOUNTER — Telehealth: Payer: Self-pay | Admitting: *Deleted

## 2017-05-19 LAB — CBC WITH DIFFERENTIAL
BASOS: 0 %
Basophils Absolute: 0 10*3/uL (ref 0.0–0.2)
EOS (ABSOLUTE): 0.2 10*3/uL (ref 0.0–0.4)
EOS: 2 %
HEMATOCRIT: 36.6 % — AB (ref 37.5–51.0)
Hemoglobin: 12.1 g/dL — ABNORMAL LOW (ref 13.0–17.7)
Immature Grans (Abs): 0 10*3/uL (ref 0.0–0.1)
Immature Granulocytes: 0 %
LYMPHS: 21 %
Lymphocytes Absolute: 2 10*3/uL (ref 0.7–3.1)
MCH: 30.4 pg (ref 26.6–33.0)
MCHC: 33.1 g/dL (ref 31.5–35.7)
MCV: 92 fL (ref 79–97)
MONOS ABS: 0.6 10*3/uL (ref 0.1–0.9)
Monocytes: 6 %
NEUTROS PCT: 71 %
Neutrophils Absolute: 6.8 10*3/uL (ref 1.4–7.0)
RBC: 3.98 x10E6/uL — AB (ref 4.14–5.80)
RDW: 13.5 % (ref 12.3–15.4)
WBC: 9.7 10*3/uL (ref 3.4–10.8)

## 2017-05-19 LAB — URIC ACID: Uric Acid: 4.7 mg/dL (ref 3.7–8.6)

## 2017-05-19 LAB — RHEUMATOID FACTOR

## 2017-05-19 NOTE — Progress Notes (Signed)
He presents today with chief complaint of a red hot swollen toe right foot. States that he has recently been undergoing kidney infection being treated for that with antibiotics. States that he would have been in the hospital and was on Lasix several weeks ago. States just 3 days ago his toe turned red and swollen and is painful. States that he's been treated for gout in the past however he does not take any medication on a regular basis for this. 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  Objective: Vital signs are stable alert and oriented 3 pulses are palpable. He appears to be slight distress as far as oxygenation. He disabilities on oxygen but he is currently not wearing it. He states his high out and it makes it worse debris. Pulses are palpable. Neurologic system is intact deep tendon reflexes are intact muscle strength is normal. Red hot swollen hallux interphalangeal joint right foot. No ulceration. The majority of the deep color is right over the dorsomedial aspect of the hallux interphalangeal joint. 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  Assessment: Whether likely this is a gouty flare particularly since he is currently on antibiotics for kidney infection. He could be changing kidney function that has caused this flare of gout or the Lasix for just his history.  Plan: Discussed etiology pathology concerned versus surgical therapies at this point I injected periarticular today about the metatarsophalangeal joint with Kenalog and local anesthetic that should alleviate his symptoms quite nicely. Also put him on call to seeing 3 tablets of 0.6 mg of colchicine spread throughout the day. Should he develop stomach distress he is to  discontinue the use of the multiple talus and continue 1 tablet daily. I also sent him for blood work which may necessitate the fact that we put him on a allopurinol. Follow up with him in a week or 2

## 2017-05-19 NOTE — Telephone Encounter (Addendum)
-----   Message from Elinor ParkinsonMax T Hyatt, [place] sent at 05/19/2017  6:48 AM EDT ----- Currently uric acid is within normal limits. Left message informing pt Dr. Geryl RankinsHyatt's review of results.

## 2017-06-03 ENCOUNTER — Ambulatory Visit (INDEPENDENT_AMBULATORY_CARE_PROVIDER_SITE_OTHER): Payer: Medicare Other | Admitting: Podiatry

## 2017-06-03 ENCOUNTER — Encounter: Payer: Self-pay | Admitting: Podiatry

## 2017-06-03 DIAGNOSIS — M109 Gout, unspecified: Secondary | ICD-10-CM | POA: Diagnosis not present

## 2017-06-07 NOTE — Progress Notes (Signed)
   HPI: Patient is a 71 year old male presenting today for follow-up of a gout attack of the right first MPJ. He states he is doing much better but is still experiencing mild pain. He states the injection and taking the colchicine has helped alleviate the pain. He denies any new complaints at this time.   Physical Exam: General: The patient is alert and oriented x3 in no acute distress.  Dermatology: Skin is warm, dry and supple bilateral lower extremities. Negative for open lesions or macerations.  Vascular: Palpable pedal pulses bilaterally. No edema or erythema noted. Capillary refill within normal limits.  Neurological: Epicritic and protective threshold grossly intact bilaterally.   Musculoskeletal Exam: Range of motion within normal limits to all pedal and ankle joints bilateral. Muscle strength 5/5 in all groups bilateral.     Assessment: 1. Acute gout attack right 1st MPJ-resolved   Plan of Care:  1. Patient was evaluated. 2. Continue wearing good shoe gear.  3. Continue taking Allopurinol per PCP.  4. Return to clinic when necessary.    Felecia ShellingBrent M. Evans, DPM [facility]  Dr. Felecia ShellingBrent M. Evans, DPM    [address]                                        [place], [place] [address]                Office [phone]  Fax [phone]

## 2017-06-20 ENCOUNTER — Ambulatory Visit (INDEPENDENT_AMBULATORY_CARE_PROVIDER_SITE_OTHER): Payer: Medicare Other | Admitting: Podiatry

## 2017-06-20 DIAGNOSIS — M79609 Pain in unspecified limb: Secondary | ICD-10-CM

## 2017-06-20 DIAGNOSIS — M2010 Hallux valgus (acquired), unspecified foot: Secondary | ICD-10-CM

## 2017-06-20 DIAGNOSIS — B351 Tinea unguium: Secondary | ICD-10-CM | POA: Diagnosis not present

## 2017-06-20 DIAGNOSIS — E1142 Type 2 diabetes mellitus with diabetic polyneuropathy: Secondary | ICD-10-CM

## 2017-06-20 NOTE — Progress Notes (Signed)
Complaint:  Visit Type: Patient returns to my office for continued preventative foot care services. Complaint: Patient states" my nails have grown long and thick and become painful to walk and wear shoes" Patient has been diagnosed with DM with no foot complications. The patient presents for preventative foot care services. No changes to ROS.    Podiatric Exam: Vascular: dorsalis pedis and posterior tibial pulses are palpable bilateral. Capillary return is immediate. Temperature gradient is WNL. Skin turgor WNL  Sensorium: Diminished  Semmes Weinstein monofilament test. Normal tactile sensation bilaterally. Nail Exam: Pt has thick disfigured discolored nails with subungual debris noted bilateral entire nail hallux through fifth toenails Ulcer Exam: There is no evidence of ulcer or pre-ulcerative changes or infection. Orthopedic Exam: Muscle tone and strength are WNL. No limitations in general ROM. No crepitus or effusions noted. Foot type and digits show no abnormalities. HAV  B/L Skin: No Porokeratosis. No infection or ulcers  Diagnosis:  Onychomycosis, , Pain in right toe, pain in left toes  Treatment & Plan Procedures and Treatment: Consent by patient was obtained for treatment procedures. The patient understood the discussion of treatment and procedures well. All questions were answered thoroughly reviewed. Debridement of mycotic and hypertrophic toenails, 1 through 5 bilateral and clearing of subungual debris. No ulceration, no infection noted.   Return Visit-Office Procedure: Patient instructed to return to the office for a follow up visit 3 months for continued evaluation and treatment.    Jeremiah Chapman DPM 

## 2017-08-23 ENCOUNTER — Other Ambulatory Visit: Payer: Self-pay | Admitting: Specialist

## 2017-08-23 DIAGNOSIS — J90 Pleural effusion, not elsewhere classified: Secondary | ICD-10-CM

## 2017-08-23 DIAGNOSIS — R0902 Hypoxemia: Secondary | ICD-10-CM

## 2017-08-23 DIAGNOSIS — R0609 Other forms of dyspnea: Principal | ICD-10-CM

## 2017-08-23 NOTE — Discharge Instructions (Signed)
Thoracentesis, Care After °Refer to this sheet in the next few weeks. These instructions provide you with information about caring for yourself after your procedure. Your health care provider may also give you more specific instructions. Your treatment has been planned according to current medical practices, but problems sometimes occur. Call your health care provider if you have any problems or questions after your procedure. °What can I expect after the procedure? °After your procedure, it is common to have pain at the puncture site. °Follow these instructions at home: °· Take medicines only as directed by your health care provider. °· You may return to your normal diet and normal activities as directed by your health care provider. °· Drink enough fluid to keep your urine clear or pale yellow. °· Do not take baths, swim, or use a hot tub until your health care provider approves. °· Follow your health care provider's instructions about: °? Puncture site care. °? Bandage (dressing) changes and removal. °· Check your puncture site every day for signs of infection. Watch for: °? Redness, swelling, or pain. °? Fluid, blood, or pus. °· Keep all follow-up visits as directed by your health care provider. This is important. °Contact a health care provider if: °· You have redness, swelling, or pain at your puncture site. °· You have fluid, blood, or pus coming from your puncture site. °· You have a fever. °· You have chills. °· You have nausea or vomiting. °· You have trouble breathing. °· You develop a worsening cough. °Get help right away if: °· You have extreme shortness of breath. °· You develop chest pain. °· You faint or feel light-headed. °This information is not intended to replace advice given to you by your health care provider. Make sure you discuss any questions you have with your health care provider. °Document Released: 12/06/2014 Document Revised: 07/17/2016 Document Reviewed: 08/27/2014 °Elsevier  Interactive Patient Education © 2018 [employer]vier Inc. ° °

## 2017-08-26 ENCOUNTER — Ambulatory Visit
Admission: RE | Admit: 2017-08-26 | Discharge: 2017-08-26 | Disposition: A | Discharge status: Home / Self Care | Payer: Medicare Other | Source: Ambulatory Visit | Attending: General Surgery | Admitting: General Surgery

## 2017-08-26 ENCOUNTER — Ambulatory Visit
Admission: RE | Admit: 2017-08-26 | Discharge: 2017-08-26 | Disposition: A | Discharge status: Home / Self Care | Payer: Medicare Other | Source: Ambulatory Visit | Attending: Specialist | Admitting: Specialist

## 2017-08-26 DIAGNOSIS — Z9889 Other specified postprocedural states: Secondary | ICD-10-CM | POA: Diagnosis not present

## 2017-08-26 DIAGNOSIS — R0609 Other forms of dyspnea: Secondary | ICD-10-CM | POA: Diagnosis present

## 2017-08-26 DIAGNOSIS — R0902 Hypoxemia: Secondary | ICD-10-CM

## 2017-08-26 DIAGNOSIS — J90 Pleural effusion, not elsewhere classified: Secondary | ICD-10-CM

## 2017-08-26 DIAGNOSIS — I517 Cardiomegaly: Secondary | ICD-10-CM | POA: Diagnosis not present

## 2017-08-26 LAB — GLUCOSE, PLEURAL OR PERITONEAL FLUID: Glucose, Fluid: 249 mg/dL

## 2017-08-26 LAB — BODY FLUID CELL COUNT WITH DIFFERENTIAL
Eos, Fluid: 0 %
Lymphs, Fluid: 79 %
Monocyte-Macrophage-Serous Fluid: 8 %
Neutrophil Count, Fluid: 13 %
Total Nucleated Cell Count, Fluid: 351 cu mm

## 2017-08-26 LAB — LACTATE DEHYDROGENASE, PLEURAL OR PERITONEAL FLUID: LD FL: 74 U/L — AB (ref 3–23)

## 2017-08-26 LAB — PROTEIN, PLEURAL OR PERITONEAL FLUID

## 2017-08-26 NOTE — Procedures (Signed)
Ultrasound-guided diagnostic and therapeutic right thoracentesis performed yielding 1 liters of serous colored fluid. No immediate complications. Follow-up chest x-ray pending.       Trei Schoch E 11:01 AM 08/26/2017

## 2017-08-27 LAB — MISC LABCORP TEST (SEND OUT): Labcorp test code: 19588

## 2017-08-29 LAB — BODY FLUID CULTURE: CULTURE: NO GROWTH

## 2017-08-29 LAB — CYTOLOGY - NON PAP

## 2017-09-19 ENCOUNTER — Encounter: Payer: Self-pay | Admitting: Podiatry

## 2017-09-19 ENCOUNTER — Ambulatory Visit (INDEPENDENT_AMBULATORY_CARE_PROVIDER_SITE_OTHER): Payer: Medicare Other | Admitting: Podiatry

## 2017-09-19 DIAGNOSIS — M2010 Hallux valgus (acquired), unspecified foot: Secondary | ICD-10-CM

## 2017-09-19 DIAGNOSIS — M79609 Pain in unspecified limb: Secondary | ICD-10-CM | POA: Diagnosis not present

## 2017-09-19 DIAGNOSIS — E1142 Type 2 diabetes mellitus with diabetic polyneuropathy: Secondary | ICD-10-CM

## 2017-09-19 DIAGNOSIS — B351 Tinea unguium: Secondary | ICD-10-CM

## 2017-09-19 DIAGNOSIS — D689 Coagulation defect, unspecified: Secondary | ICD-10-CM

## 2017-09-19 NOTE — Progress Notes (Signed)
Complaint:  Visit Type: Patient returns to my office for continued preventative foot care services. Complaint: Patient states" my nails have grown long and thick and become painful to walk and wear shoes" Patient has been diagnosed with DM with no foot complications. The patient presents for preventative foot care services. No changes to ROS.  Patient on plavix.  Podiatric Exam: Vascular: dorsalis pedis and posterior tibial pulses are palpable bilateral. Capillary return is immediate. Temperature gradient is WNL. Skin turgor WNL  Sensorium: Diminished  Semmes Weinstein monofilament test. Normal tactile sensation bilaterally. Nail Exam: Pt has thick disfigured discolored nails with subungual debris noted bilateral entire nail hallux through fifth toenails Ulcer Exam: There is no evidence of ulcer or pre-ulcerative changes or infection. Orthopedic Exam: Muscle tone and strength are WNL. No limitations in general ROM. No crepitus or effusions noted. Foot type and digits show no abnormalities. HAV   B/L Skin: No Porokeratosis. No infection or ulcers  Diagnosis:  Onychomycosis, , Pain in right toe, pain in left toes  Treatment & Plan Procedures and Treatment: Consent by patient was obtained for treatment procedures. The patient understood the discussion of treatment and procedures well. All questions were answered thoroughly reviewed. Debridement of mycotic and hypertrophic toenails, 1 through 5 bilateral and clearing of subungual debris. No ulceration, no infection noted.  Return Visit-Office Procedure: Patient instructed to return to the office for a follow up visit 3 months for continued evaluation and treatment.    Helane GuntherGregory Zandra Lajeunesse DPM

## 2017-11-16 LAB — ACID FAST SMEAR (AFB, MYCOBACTERIA): Acid Fast Smear: NEGATIVE

## 2017-11-17 LAB — FUNGUS CULTURE WITH STAIN

## 2017-11-17 LAB — FUNGAL ORGANISM REFLEX

## 2017-11-17 LAB — FUNGUS CULTURE RESULT

## 2017-11-18 LAB — ACID FAST CULTURE WITH REFLEXED SENSITIVITIES (MYCOBACTERIA)

## 2017-12-05 IMAGING — DX DG CHEST 1V PORT
1 series · 1 of 1 positions shown · non-contrast
Comparison: 03/26/2017.

CLINICAL DATA: Shortness of breath.

EXAM:
PORTABLE CHEST 1 VIEW

[chest ap]
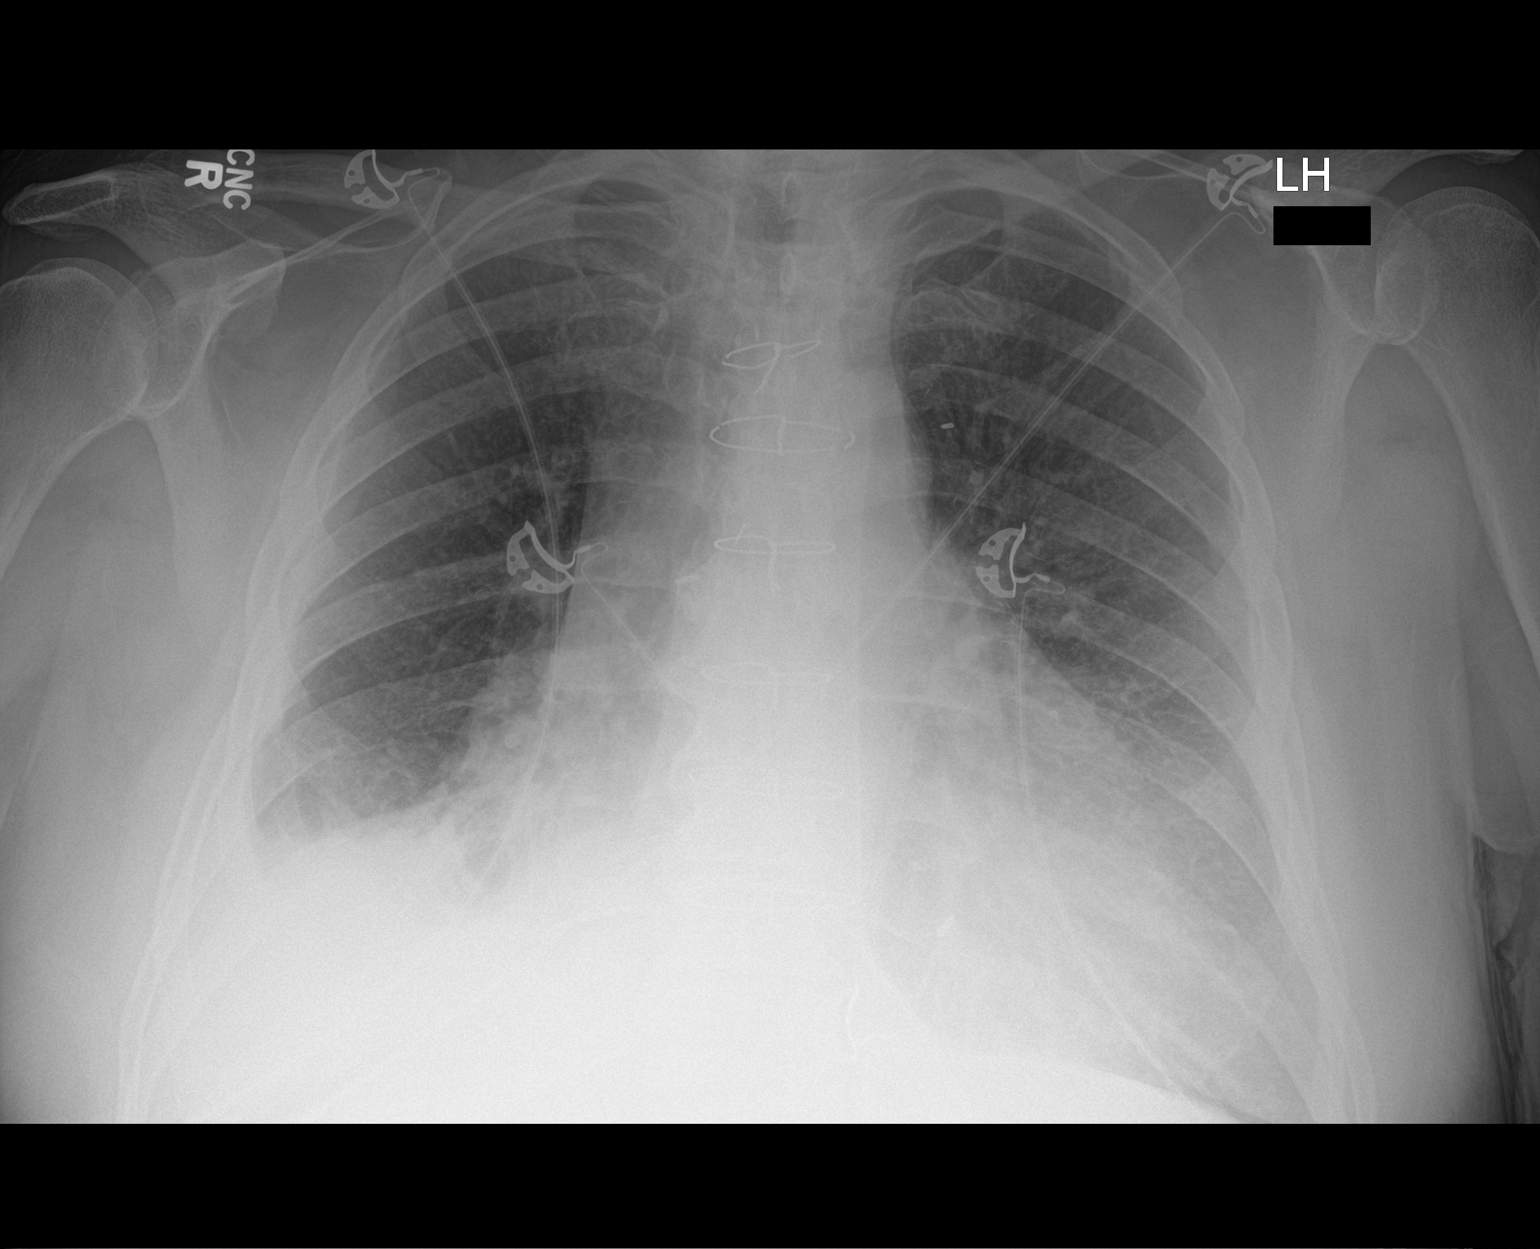

[1 of 1 positions shown; findings below may reference images not displayed]

FINDINGS: Prior CABG. Cardiomegaly with normal pulmonary vascularity. Right
lower lobe atelectasis and/or infiltrate with small right pleural
effusion. Small left pleural effusion also noted.
IMPRESSION: 1. Prior CABG. Cardiomegaly with normal pulmonary vascularity. Heart
size stable.

2. Right lower lobe atelectasis and/or infiltrate with small right
pleural effusion. Small left pleural effusion also noted.

## 2017-12-22 ENCOUNTER — Ambulatory Visit: Payer: Medicare Other | Admitting: Podiatry

## 2017-12-26 ENCOUNTER — Ambulatory Visit (INDEPENDENT_AMBULATORY_CARE_PROVIDER_SITE_OTHER): Payer: Medicare Other | Admitting: Podiatry

## 2017-12-26 ENCOUNTER — Encounter: Payer: Self-pay | Admitting: Podiatry

## 2017-12-26 DIAGNOSIS — E1142 Type 2 diabetes mellitus with diabetic polyneuropathy: Secondary | ICD-10-CM

## 2017-12-26 DIAGNOSIS — M79609 Pain in unspecified limb: Secondary | ICD-10-CM

## 2017-12-26 DIAGNOSIS — M2010 Hallux valgus (acquired), unspecified foot: Secondary | ICD-10-CM

## 2017-12-26 DIAGNOSIS — D689 Coagulation defect, unspecified: Secondary | ICD-10-CM

## 2017-12-26 DIAGNOSIS — B351 Tinea unguium: Secondary | ICD-10-CM | POA: Diagnosis not present

## 2017-12-26 NOTE — Progress Notes (Signed)
Complaint:  Visit Type: Patient returns to my office for continued preventative foot care services. Complaint: Patient states" my nails have grown long and thick and become painful to walk and wear shoes" Patient has been diagnosed with DM with no foot complications. The patient presents for preventative foot care services. No changes to ROS.  Patient on plavix.  Podiatric Exam: Vascular: dorsalis pedis and posterior tibial pulses are palpable bilateral. Capillary return is immediate. Temperature gradient is WNL. Skin turgor WNL  Sensorium: Diminished  Semmes Weinstein monofilament test. Normal tactile sensation bilaterally. Nail Exam: Pt has thick disfigured discolored nails with subungual debris noted bilateral entire nail hallux through fifth toenails Ulcer Exam: There is no evidence of ulcer or pre-ulcerative changes or infection. Orthopedic Exam: Muscle tone and strength are WNL. No limitations in general ROM. No crepitus or effusions noted. Foot type and digits show no abnormalities. HAV   B/L Skin: No Porokeratosis. No infection or ulcers  Diagnosis:  Onychomycosis, , Pain in right toe, pain in left toes  Treatment & Plan Procedures and Treatment: Consent by patient was obtained for treatment procedures. The patient understood the discussion of treatment and procedures well. All questions were answered thoroughly reviewed. Debridement of mycotic and hypertrophic toenails, 1 through 5 bilateral and clearing of subungual debris. No ulceration, no infection noted.  Return Visit-Office Procedure: Patient instructed to return to the office for a follow up visit 3 months for continued evaluation and treatment.    Cella Cappello DPM 

## 2018-01-14 ENCOUNTER — Emergency Department
Admission: EM | Admit: 2018-01-14 | Discharge: 2018-01-14 | Disposition: A | Discharge status: Home / Self Care | Payer: Medicare Other | Attending: Student in an Organized Health Care Education/Training Program | Admitting: Student in an Organized Health Care Education/Training Program

## 2018-01-14 ENCOUNTER — Encounter: Payer: Self-pay | Admitting: Emergency Medicine

## 2018-01-14 ENCOUNTER — Other Ambulatory Visit: Payer: Self-pay

## 2018-01-14 DIAGNOSIS — Z955 Presence of coronary angioplasty implant and graft: Secondary | ICD-10-CM | POA: Insufficient documentation

## 2018-01-14 DIAGNOSIS — I509 Heart failure, unspecified: Secondary | ICD-10-CM | POA: Diagnosis not present

## 2018-01-14 DIAGNOSIS — Z794 Long term (current) use of insulin: Secondary | ICD-10-CM | POA: Diagnosis not present

## 2018-01-14 DIAGNOSIS — I110 Hypertensive heart disease with heart failure: Secondary | ICD-10-CM | POA: Insufficient documentation

## 2018-01-14 DIAGNOSIS — R040 Epistaxis: Secondary | ICD-10-CM | POA: Diagnosis present

## 2018-01-14 DIAGNOSIS — Z87891 Personal history of nicotine dependence: Secondary | ICD-10-CM | POA: Diagnosis not present

## 2018-01-14 DIAGNOSIS — Z79899 Other long term (current) drug therapy: Secondary | ICD-10-CM | POA: Diagnosis not present

## 2018-01-14 DIAGNOSIS — Z7982 Long term (current) use of aspirin: Secondary | ICD-10-CM | POA: Diagnosis not present

## 2018-01-14 DIAGNOSIS — I2510 Atherosclerotic heart disease of native coronary artery without angina pectoris: Secondary | ICD-10-CM | POA: Diagnosis not present

## 2018-01-14 DIAGNOSIS — Z7901 Long term (current) use of anticoagulants: Secondary | ICD-10-CM | POA: Diagnosis not present

## 2018-01-14 DIAGNOSIS — E119 Type 2 diabetes mellitus without complications: Secondary | ICD-10-CM | POA: Diagnosis not present

## 2018-01-14 DIAGNOSIS — Z951 Presence of aortocoronary bypass graft: Secondary | ICD-10-CM | POA: Insufficient documentation

## 2018-01-14 MED ORDER — LIDOCAINE-EPINEPHRINE 2 %-1:100000 IJ SOLN
20.0000 mL | Freq: Once | INTRAMUSCULAR | Status: DC
  Filled 2018-01-14: qty 20

## 2018-01-14 MED ORDER — SILVER NITRATE-POT NITRATE 75-25 % EX MISC
CUTANEOUS | Status: AC
  Filled 2018-01-14: qty 1

## 2018-01-14 MED ORDER — SILVER NITRATE-POT NITRATE 75-25 % EX MISC: 1.0000 "application " | Freq: Once | CUTANEOUS | Status: DC

## 2018-01-14 MED ORDER — AMOXICILLIN-POT CLAVULANATE 875-125 MG PO TABS: 1.0000 | ORAL_TABLET | Freq: Two times a day (BID) | ORAL | 0 refills | Status: AC

## 2018-01-14 MED ORDER — LIDOCAINE-EPINEPHRINE (PF) 1 %-1:200000 IJ SOLN
INTRAMUSCULAR | Status: AC
  Filled 2018-01-14: qty 30

## 2018-01-14 MED ORDER — LIDOCAINE HCL 2 % EX GEL
CUTANEOUS | Status: AC
  Filled 2018-01-14: qty 10

## 2018-01-14 MED ORDER — LIDOCAINE HCL 2 % EX GEL: 1.0000 "application " | Freq: Once | CUTANEOUS | Status: DC

## 2018-01-14 MED ORDER — OXYMETAZOLINE HCL 0.05 % NA SOLN
1.0000 | Freq: Once | NASAL | Status: DC
  Filled 2018-01-14: qty 15

## 2018-01-14 NOTE — ED Triage Notes (Signed)
Pt to ed with c/o nose bleed since 4pm.  Pt states hx of same.  Pt reports similar episode 3 weeks ago.  Pt reports he is on eliquis and plavix for a fib.  Clamp applied at triage to his nose. Bleeding controlled at this time.

## 2018-01-14 NOTE — ED Notes (Signed)
Afrin and xylocaine obtained for dr. Roxan Hockeyrobinson

## 2018-01-14 NOTE — ED Notes (Signed)
urojet obtained for dr. Roxan Hockeyrobinson

## 2018-01-14 NOTE — ED Notes (Signed)
Pt noted to be sitting in waiting room with nose clip removed, says it was making his nose sore; pt says he thinks the bleeding has stopped; pt encouraged to pinch bridge of nose with fingers if he feels like it's starting to bleed again and the clip is making him uncomfortable; verbalized understanding

## 2018-01-14 NOTE — ED Provider Notes (Signed)
[facility] Emergency Department Provider Note    None    (approximate)  I have reviewed the triage vital signs and the nursing notes.   HISTORY  Chief Complaint Epistaxis    HPI Lissa MoralesKenneth D Trevathan is a 72 y.o. male with a history of A. fib CAD and CHF on aspirin Plavix and Eliquis presents with epistaxis from the left nare that started today after he sneezed.  Had episode of epistaxis last week where he went to urgent care and it was abated with Afrin and pressure.  States he feels like he sneezed out a clot that was preventing him from having further bleeding.  No fevers.  No trauma.  Bleeding has stopped with pressure upon arrival to the ER.  Past Medical History:  Diagnosis Date  . Atrial fibrillation (HCC)   . CAD (coronary artery disease)   . CHF (congestive heart failure) (HCC)   . Diabetes mellitus without complication (HCC)   . Gout   . Hypercholesterolemia   . Hypertension    Family History  Problem Relation Age of Onset  . Hypertension Mother    Past Surgical History:  Procedure Laterality Date  . CARDIAC CATHETERIZATION    . CARDIOVERSION N/A 01/20/2017   Procedure: CARDIOVERSION;  Surgeon: Marcina MillardAlexander Paraschos, MD;  Location: [facility] ORS;  Service: Cardiovascular;  Laterality: N/A;  . COLONOSCOPY    . CORONARY ARTERY BYPASS GRAFT    . CORONARY STENT INTERVENTION N/A 03/28/2017   Procedure: Coronary Stent Intervention;  Surgeon: Marcina MillardAlexander Paraschos, MD;  Location: [facility] INVASIVE CV LAB;  Service: Cardiovascular;  Laterality: N/A;  . LEFT HEART CATH AND CORONARY ANGIOGRAPHY N/A 03/28/2017   Procedure: Left Heart Cath and Coronary Angiography;  Surgeon: Marcina MillardAlexander Paraschos, MD;  Location: [facility] INVASIVE CV LAB;  Service: Cardiovascular;  Laterality: N/A;  . limbar back surgery    . PENILE PROSTHESIS IMPLANT     Patient Active Problem List   Diagnosis Date Noted  . PNA (pneumonia) 04/21/2017  . S/P cardiac catheterization 04/05/2017  .  Bradycardia 03/26/2017  . Elevated troponin 03/26/2017  . AKI (acute kidney injury) (HCC) 03/26/2017  . SOB (shortness of breath) 04/15/2016  . CAD (coronary artery disease) 10/21/2015  . Hyperlipidemia 02/12/2015  . Paresthesia of foot, bilateral 02/12/2015  . H/O adenomatous polyp of colon 01/08/2015  . Essential hypertension 10/14/2014  . Type 2 diabetes mellitus without complication (HCC) 10/14/2014  . 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 Atrial fibrillation (HCC) 06/07/2014  . Gout 06/07/2014      Prior to Admission medications   Medication Sig Start Date End Date Taking? Authorizing Provider  amoxicillin-clavulanate (AUGMENTIN) 875-125 MG tablet Take 1 tablet by mouth 2 (two) times daily for 7 days. 01/14/18 01/21/18  Willy Eddyobinson, Nethan Caudillo, MD  aspirin 81 MG chewable tablet Chew 1 tablet (81 mg total) by mouth daily. 03/30/17   Altamese DillingVachhani, Vaibhavkumar, MD  aspirin 81 MG chewable tablet Chew by mouth. 06/23/17   [provider]  atorvastatin (LIPITOR) 80 MG tablet Take 1 tablet (80 mg total) by mouth daily at 6 PM. 03/29/17   Altamese DillingVachhani, Vaibhavkumar, MD  atorvastatin (LIPITOR) 80 MG tablet Take by mouth. 06/23/17   [provider]  budesonide-formoterol (SYMBICORT) 160-4.5 MCG/ACT inhaler Inhale into the lungs. 08/15/17 08/15/18  [provider]  clopidogrel (PLAVIX) 75 MG tablet Take by mouth. 12/22/17 12/22/18  [provider]  colchicine 0.6 MG tablet Take 1 tablet TID until GI upset or flare subsides, then for maintenance, take  1 tablet daily. 05/18/17   Hyatt, Max T, DPM  digoxin (LANOXIN) 0.125 MG tablet Take 1 tablet (0.125 mg total) by mouth daily. 04/23/17   Auburn Bilberry, MD  FLUZONE HIGH-DOSE 0.5 ML injection inject 0.5 milliliter intramuscularly 07/26/17   [provider]  furosemide (LASIX) 20 MG tablet Take 20 mg by mouth daily. 08/22/17   [provider]  gabapentin (NEURONTIN) 100 MG capsule take 2 capsules by mouth once daily if needed  12/30/16   [provider]  glyBURIDE (DIABETA) 2.5 MG tablet Take by mouth. 09/02/17   [provider]  insulin aspart (NOVOLOG) 100 UNIT/ML injection  04/26/17   [provider]  irbesartan (AVAPRO) 300 MG tablet Take 1 tablet (300 mg total) by mouth daily. 03/30/17   Altamese Dilling, MD  irbesartan (AVAPRO) 300 MG tablet Take by mouth. 06/23/17   [provider]  LEVEMIR FLEXTOUCH 100 UNIT/ML Pen  08/18/17   [provider]  metFORMIN (GLUCOPHAGE-XR) 500 MG 24 hr tablet Take by mouth. 11/02/17 11/02/18  [provider]  metoprolol succinate (TOPROL-XL) 25 MG 24 hr tablet Take by mouth. 06/23/17   [provider]  PNEUMOVAX 23 25 MCG/0.5ML injection inject 0.5 milliliter intramuscularly 09/01/17   [provider]  [facility] injection inject 0.5 milliliter intramuscularly 09/01/17   [provider]  SPIRIVA HANDIHALER 18 MCG inhalation capsule Place 18 mcg into inhaler and inhale daily. 04/11/17   [provider]    Allergies Sitagliptin    Social History Social History   Tobacco Use  . Smoking status: Former Smoker    Packs/day: 0.50    Years: 50.00    Pack years: 25.00    Types: Cigarettes  . Smokeless tobacco: Current User  Substance Use Topics  . Alcohol use: No    Alcohol/week: 0.0 oz  . Drug use: No    Review of Systems Patient denies headaches, rhinorrhea, blurry vision, numbness, shortness of breath, chest pain, edema, cough, abdominal pain, nausea, vomiting, diarrhea, dysuria, fevers, rashes or hallucinations unless otherwise stated above in HPI. ____________________________________________   PHYSICAL EXAM:  VITAL SIGNS: Vitals:   01/14/18 1738  BP: (!) 157/62  Pulse: 62  Resp: 18  SpO2: 100%    Constitutional: Alert and oriented. Well appearing and in no acute distress. Eyes: Conjunctivae are normal.  Head: Atraumatic. Nose there is friable appearing mucosa Kessell backs  try but no evidence of pulsatile polyp amenable to cauterization. Mouth/Throat: Mucous membranes are moist.   Neck: No stridor. Painless ROM.  Cardiovascular: Normal rate, regular rhythm. Grossly normal heart sounds.  Good peripheral circulation. Respiratory: Normal respiratory effort.  No retractions. Lungs CTAB. Gastrointestinal: Soft and nontender. No distention. No abdominal bruits. No CVA tenderness. Genitourinary:  Musculoskeletal: No lower extremity tenderness nor edema.  No joint effusions. Neurologic:  Normal speech and language. No gross focal neurologic deficits are appreciated. No facial droop Skin:  Skin is warm, dry and intact. No rash noted. Psychiatric: Mood and affect are normal. Speech and behavior are normal.  ____________________________________________   LABS (all labs ordered are listed, but only abnormal results are displayed)  No results found for this or any previous visit (from the past 24 hour(s)). ____________________________________________ ____________________________________________  RADIOLOGY   ____________________________________________   PROCEDURES  Procedure(s) performed:  .Epistaxis Management Date/Time: 01/14/2018 10:55 PM Performed by: Willy Eddy, MD Authorized by: Willy Eddy, MD   Consent:    Consent obtained:  Verbal   Consent given by:  Patient   Risks  discussed:  Nasal injury, infection, bleeding and pain   Alternatives discussed:  No treatment Anesthesia (see MAR for exact dosages):    Anesthesia method:  Topical application   Topical anesthetic:  Lidocaine gel Procedure details:    Treatment site:  L anterior   Treatment method:  Merocel sponge   Treatment complexity:  Limited   Treatment episode: recurring   Post-procedure details:    Assessment:  Bleeding stopped   Patient tolerance of procedure:  Tolerated well, no immediate complications      Critical Care performed:  no ____________________________________________   INITIAL IMPRESSION / ASSESSMENT AND PLAN / ED COURSE  Pertinent labs & imaging results that were available during my care of the patient were reviewed by me and considered in my medical decision making (see chart for details).  DDX: anterior epistaxis, posterior epistaxis, rhinnitis  DURAND WITTMEYER is a 72 y.o. who presents to the ED with recurrent anterior epistaxis on anticoagulation.  Patient Heema dynamically stable.  No evidence of posterior bleed.  Anterior packing placed after direct inspection and no evidence of polyp or vessel that would be amenable to cauterization.  Patient started on antibiotics and given referral to ENT.  Have discussed with the patient and available family all diagnostics and treatments performed thus far and all questions were answered to the best of my ability. The patient demonstrates understanding and agreement with plan.       ____________________________________________   FINAL CLINICAL IMPRESSION(S) / ED DIAGNOSES  Final diagnoses:  Left-sided epistaxis      NEW MEDICATIONS STARTED DURING THIS VISIT:  Discharge Medication List as of 01/14/2018 10:02 PM    START taking these medications   Details  amoxicillin-clavulanate (AUGMENTIN) 875-125 MG tablet Take 1 tablet by mouth 2 (two) times daily for 7 days., Starting Sat 01/14/2018, Until Sat 01/21/2018, Print         Note:  This document was prepared using Dragon voice recognition software and may include unintentional dictation errors.    Willy Eddy, MD 01/14/18 2257

## 2018-01-14 NOTE — ED Notes (Signed)
Silver nitrate obtained for dr. Roxan Hockeyobinson but he does not need. Returned all.

## 2018-03-30 ENCOUNTER — Ambulatory Visit (INDEPENDENT_AMBULATORY_CARE_PROVIDER_SITE_OTHER): Payer: Medicare Other | Admitting: Podiatry

## 2018-03-30 ENCOUNTER — Encounter: Payer: Self-pay | Admitting: Podiatry

## 2018-03-30 DIAGNOSIS — M79609 Pain in unspecified limb: Secondary | ICD-10-CM

## 2018-03-30 DIAGNOSIS — D689 Coagulation defect, unspecified: Secondary | ICD-10-CM

## 2018-03-30 DIAGNOSIS — B351 Tinea unguium: Secondary | ICD-10-CM | POA: Diagnosis not present

## 2018-03-30 DIAGNOSIS — E1142 Type 2 diabetes mellitus with diabetic polyneuropathy: Secondary | ICD-10-CM

## 2018-03-30 NOTE — Progress Notes (Signed)
Complaint:  Visit Type: Patient returns to my office for continued preventative foot care services. Complaint: Patient states" my nails have grown long and thick and become painful to walk and wear shoes" Patient has been diagnosed with DM with no foot complications. The patient presents for preventative foot care services. No changes to ROS.  Patient on plavix.  Podiatric Exam: Vascular: dorsalis pedis and posterior tibial pulses are palpable bilateral. Capillary return is immediate. Temperature gradient is WNL. Skin turgor WNL  Sensorium: Diminished  Semmes Weinstein monofilament test. Normal tactile sensation bilaterally. Nail Exam: Pt has thick disfigured discolored nails with subungual debris noted bilateral entire nail hallux through fifth toenails Ulcer Exam: There is no evidence of ulcer or pre-ulcerative changes or infection. Orthopedic Exam: Muscle tone and strength are WNL. No limitations in general ROM. No crepitus or effusions noted. Foot type and digits show no abnormalities. HAV   B/L Skin: No Porokeratosis. No infection or ulcers.  Dry skin on plantar aspect right heel.  Diagnosis:  Onychomycosis, , Pain in right toe, pain in left toes  Treatment & Plan Procedures and Treatment: Consent by patient was obtained for treatment procedures. The patient understood the discussion of treatment and procedures well. All questions were answered thoroughly reviewed. Debridement of mycotic and hypertrophic toenails, 1 through 5 bilateral and clearing of subungual debris. No ulceration, no infection noted. Apply vaseline to right heel. Return Visit-Office Procedure: Patient instructed to return to the office for a follow up visit 3 months for continued evaluation and treatment.    Helane Gunther DPM

## 2018-07-03 ENCOUNTER — Encounter: Payer: Self-pay | Admitting: Podiatry

## 2018-07-03 ENCOUNTER — Ambulatory Visit (INDEPENDENT_AMBULATORY_CARE_PROVIDER_SITE_OTHER): Payer: Medicare Other | Admitting: Podiatry

## 2018-07-03 DIAGNOSIS — B351 Tinea unguium: Secondary | ICD-10-CM

## 2018-07-03 DIAGNOSIS — M79609 Pain in unspecified limb: Secondary | ICD-10-CM

## 2018-07-03 DIAGNOSIS — D689 Coagulation defect, unspecified: Secondary | ICD-10-CM

## 2018-07-03 DIAGNOSIS — E1142 Type 2 diabetes mellitus with diabetic polyneuropathy: Secondary | ICD-10-CM

## 2018-07-03 NOTE — Progress Notes (Signed)
Complaint:  Visit Type: Patient returns to my office for continued preventative foot care services. Complaint: Patient states" my nails have grown long and thick and become painful to walk and wear shoes" Patient has been diagnosed with DM with no foot complications. The patient presents for preventative foot care services. No changes to ROS.  Patient on plavix. Patient has fallen and injured his back and has excoriation outside of left ankle.  Podiatric Exam: Vascular: dorsalis pedis and posterior tibial pulses are palpable bilateral. Capillary return is immediate. Temperature gradient is WNL. Skin turgor WNL  Sensorium: Diminished  Semmes Weinstein monofilament test. Normal tactile sensation bilaterally. Nail Exam: Pt has thick disfigured discolored nails with subungual debris noted bilateral entire nail hallux through fifth toenails Ulcer Exam: There is no evidence of ulcer or pre-ulcerative changes or infection. Orthopedic Exam: Muscle tone and strength are WNL. No limitations in general ROM. No crepitus or effusions noted. Foot type and digits show no abnormalities. HAV   B/L Skin: No Porokeratosis. No infection or ulcers.  Dry skin on plantar aspect right heel.  Diagnosis:  Onychomycosis, , Pain in right toe, pain in left toes  Treatment & Plan Procedures and Treatment: Consent by patient was obtained for treatment procedures. The patient understood the discussion of treatment and procedures well. All questions were answered thoroughly reviewed. Debridement of mycotic and hypertrophic toenails, 1 through 5 bilateral and clearing of subungual debris. No ulceration, no infection noted.  Return Visit-Office Procedure: Patient instructed to return to the office for a follow up visit 3 months for continued evaluation and treatment.    Helane GuntherGregory Toshio Slusher DPM

## 2018-07-19 ENCOUNTER — Other Ambulatory Visit: Payer: Self-pay | Admitting: Family Medicine

## 2018-07-19 ENCOUNTER — Other Ambulatory Visit (HOSPITAL_COMMUNITY): Payer: Self-pay | Admitting: Interventional Radiology

## 2018-07-19 DIAGNOSIS — I714 Abdominal aortic aneurysm, without rupture, unspecified: Secondary | ICD-10-CM

## 2018-07-24 ENCOUNTER — Ambulatory Visit: Payer: Medicare Other

## 2018-07-26 ENCOUNTER — Ambulatory Visit: Payer: Medicare Other

## 2018-07-28 ENCOUNTER — Ambulatory Visit
Admission: RE | Admit: 2018-07-28 | Discharge: 2018-07-28 | Disposition: A | Discharge status: Home / Self Care | Payer: Medicare Other | Source: Ambulatory Visit | Attending: Family Medicine | Admitting: Family Medicine

## 2018-07-28 DIAGNOSIS — I7789 Other specified disorders of arteries and arterioles: Secondary | ICD-10-CM | POA: Insufficient documentation

## 2018-07-28 DIAGNOSIS — I714 Abdominal aortic aneurysm, without rupture, unspecified: Secondary | ICD-10-CM

## 2018-07-28 DIAGNOSIS — I700 Atherosclerosis of aorta: Secondary | ICD-10-CM | POA: Insufficient documentation

## 2018-10-02 ENCOUNTER — Ambulatory Visit (INDEPENDENT_AMBULATORY_CARE_PROVIDER_SITE_OTHER): Payer: Medicare Other | Admitting: Podiatry

## 2018-10-02 ENCOUNTER — Encounter: Payer: Self-pay | Admitting: Podiatry

## 2018-10-02 DIAGNOSIS — D689 Coagulation defect, unspecified: Secondary | ICD-10-CM

## 2018-10-02 DIAGNOSIS — E1142 Type 2 diabetes mellitus with diabetic polyneuropathy: Secondary | ICD-10-CM

## 2018-10-02 DIAGNOSIS — M79609 Pain in unspecified limb: Secondary | ICD-10-CM | POA: Diagnosis not present

## 2018-10-02 DIAGNOSIS — B351 Tinea unguium: Secondary | ICD-10-CM | POA: Diagnosis not present

## 2018-10-02 NOTE — Progress Notes (Signed)
Complaint:  Visit Type: Patient returns to my office for continued preventative foot care services. Complaint: Patient states" my nails have grown long and thick and become painful to walk and wear shoes" Patient has been diagnosed with DM with no foot complications. The patient presents for preventative foot care services. No changes to ROS.  Patient on plavix.  Podiatric Exam: Vascular: dorsalis pedis and posterior tibial pulses are palpable bilateral. Capillary return is immediate. Temperature gradient is WNL. Skin turgor WNL  Sensorium: Diminished  Semmes Weinstein monofilament test. Normal tactile sensation bilaterally. Nail Exam: Pt has thick disfigured discolored nails with subungual debris noted bilateral entire nail hallux through fifth toenails Ulcer Exam: There is no evidence of ulcer or pre-ulcerative changes or infection. Orthopedic Exam: Muscle tone and strength are WNL. No limitations in general ROM. No crepitus or effusions noted. Foot type and digits show no abnormalities. HAV   B/L Skin: No Porokeratosis. No infection or ulcers.  Dry skin on plantar aspect right heel.  Diagnosis:  Onychomycosis, , Pain in right toe, pain in left toes  Treatment & Plan Procedures and Treatment: Consent by patient was obtained for treatment procedures. The patient understood the discussion of treatment and procedures well. All questions were answered thoroughly reviewed. Debridement of mycotic and hypertrophic toenails, 1 through 5 bilateral and clearing of subungual debris. No ulceration, no infection noted. Apply vaseline to right heel. Return Visit-Office Procedure: Patient instructed to return to the office for a follow up visit 3 months for continued evaluation and treatment.    Hessie Varone DPM 

## 2019-01-01 ENCOUNTER — Ambulatory Visit (INDEPENDENT_AMBULATORY_CARE_PROVIDER_SITE_OTHER): Payer: Medicare Other | Admitting: Podiatry

## 2019-01-01 ENCOUNTER — Encounter: Payer: Self-pay | Admitting: Podiatry

## 2019-01-01 DIAGNOSIS — B351 Tinea unguium: Secondary | ICD-10-CM

## 2019-01-01 DIAGNOSIS — M79609 Pain in unspecified limb: Secondary | ICD-10-CM | POA: Diagnosis not present

## 2019-01-01 DIAGNOSIS — E1142 Type 2 diabetes mellitus with diabetic polyneuropathy: Secondary | ICD-10-CM

## 2019-01-01 DIAGNOSIS — D689 Coagulation defect, unspecified: Secondary | ICD-10-CM

## 2019-01-01 NOTE — Progress Notes (Signed)
Complaint:  Visit Type: Patient returns to my office for continued preventative foot care services. Complaint: Patient states" my nails have grown long and thick and become painful to walk and wear shoes" Patient has been diagnosed with DM with no foot complications. The patient presents for preventative foot care services. No changes to ROS.  Patient on eliquiss..  Podiatric Exam: Vascular: dorsalis pedis and posterior tibial pulses are palpable bilateral. Capillary return is immediate. Temperature gradient is WNL. Skin turgor WNL  Sensorium: Diminished  Semmes Weinstein monofilament test. Normal tactile sensation bilaterally. Nail Exam: Pt has thick disfigured discolored nails with subungual debris noted bilateral entire nail hallux through fifth toenails Ulcer Exam: There is no evidence of ulcer or pre-ulcerative changes or infection. Orthopedic Exam: Muscle tone and strength are WNL. No limitations in general ROM. No crepitus or effusions noted. Foot type and digits show no abnormalities. HAV   B/L Skin: No Porokeratosis. No infection or ulcers.    Diagnosis:  Onychomycosis, , Pain in right toe, pain in left toes  Treatment & Plan Procedures and Treatment: Consent by patient was obtained for treatment procedures. The patient understood the discussion of treatment and procedures well. All questions were answered thoroughly reviewed. Debridement of mycotic and hypertrophic toenails, 1 through 5 bilateral and clearing of subungual debris. No ulceration, no infection noted.  Return Visit-Office Procedure: Patient instructed to return to the office for a follow up visit 3 months for continued evaluation and treatment.    Ezra Denne DPM 

## 2019-03-13 IMAGING — US US AORTA SCREENING (MEDICARE)
1 series · 14 of 25 positions shown · non-contrast
Comparison: None.

CLINICAL DATA: 71-year-old male with abdominal aortic aneurysm
without rupture

EXAM:
ULTRASOUND OF ABDOMINAL AORTA
TECHNIQUE: Ultrasound examination of the abdominal aorta was performed to
evaluate for abdominal aortic aneurysm.

[Series 1: us aorta screening (medicare) · 14 of 40 slices shown]
[im 1/40]
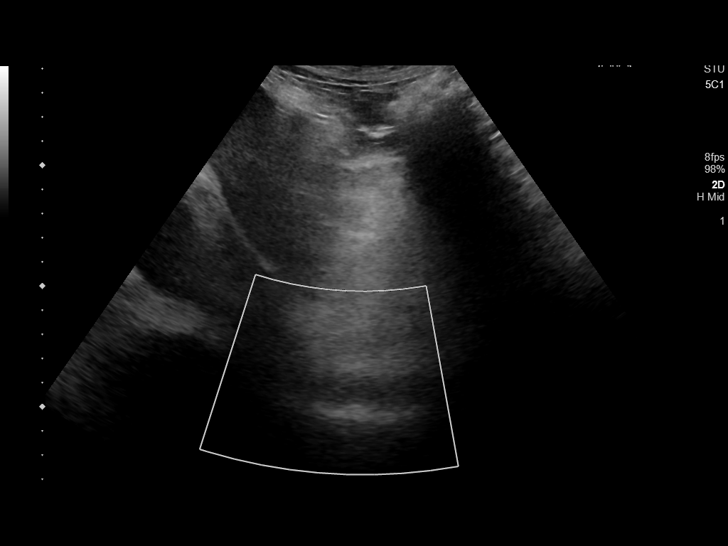
[im 4/40]
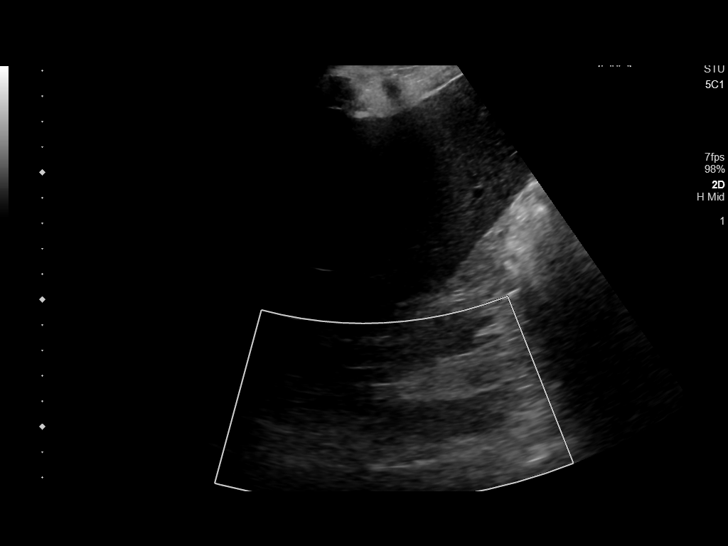
[im 7/40]
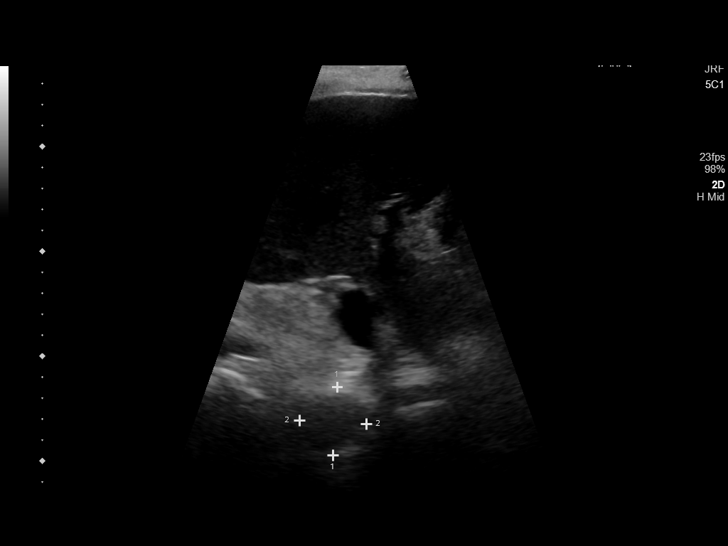
[im 10/40]
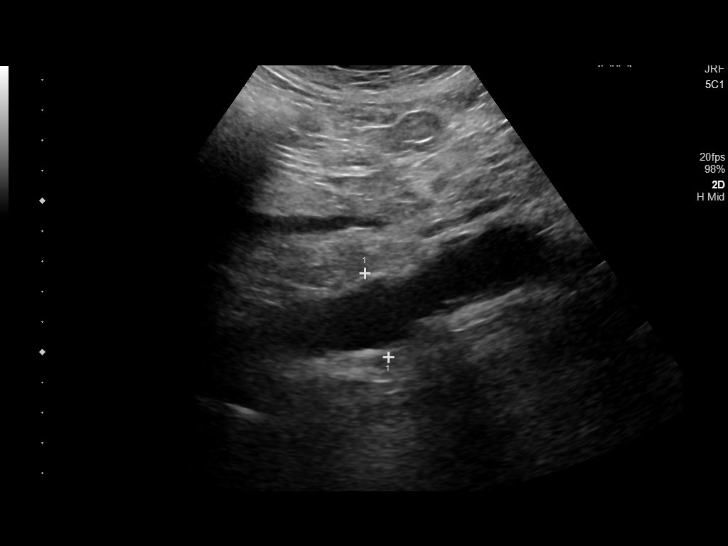
[im 14/40]
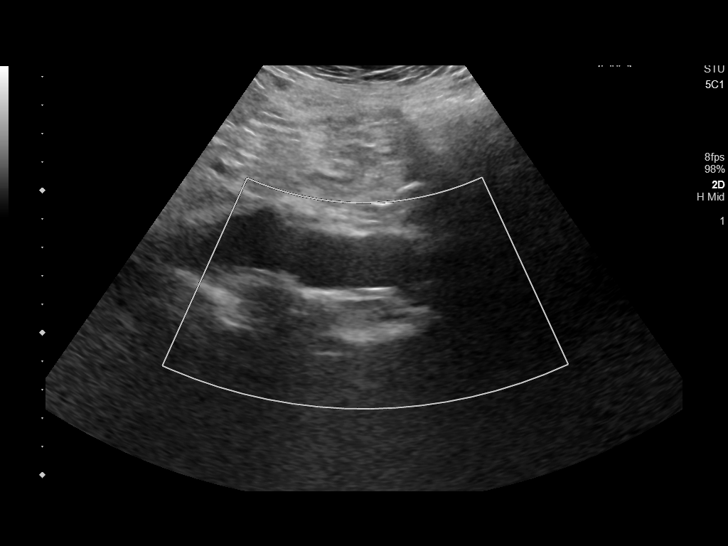
[im 15/40]
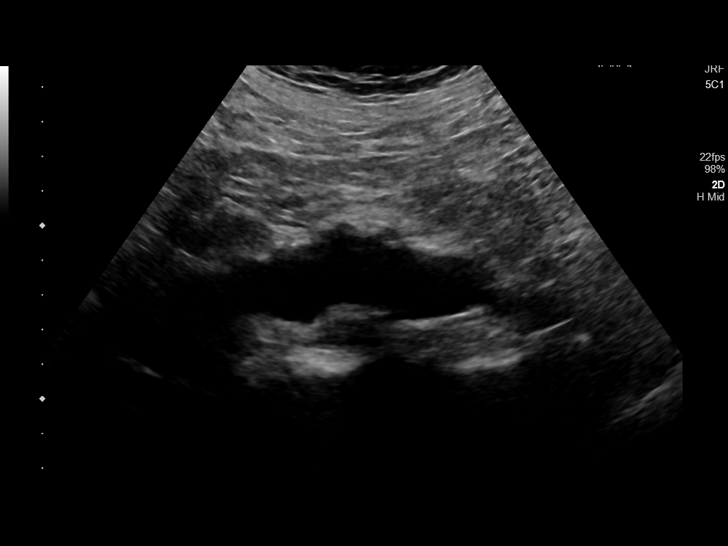
[im 18/40]
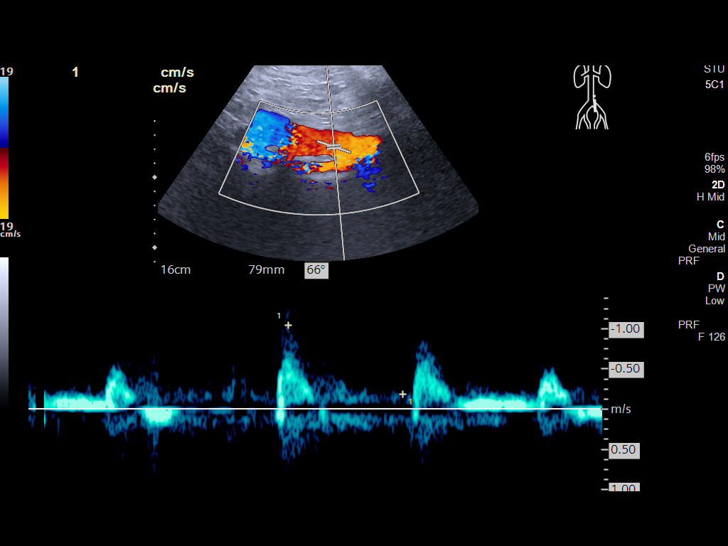
[im 22/40]
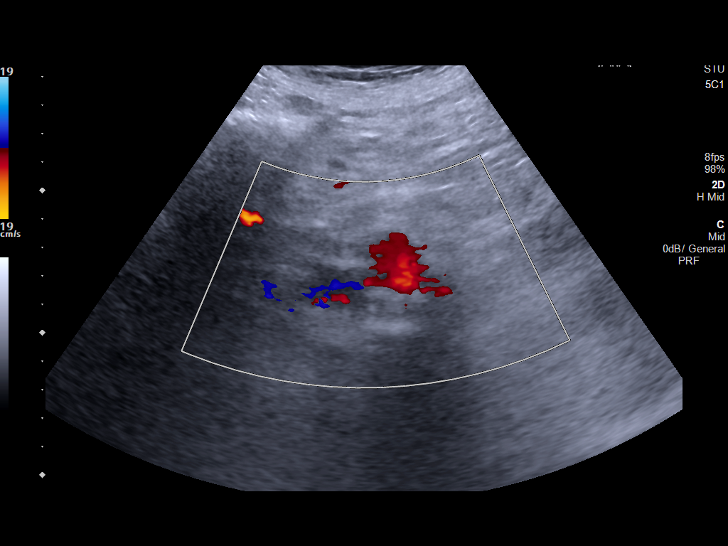
[im 25/40]
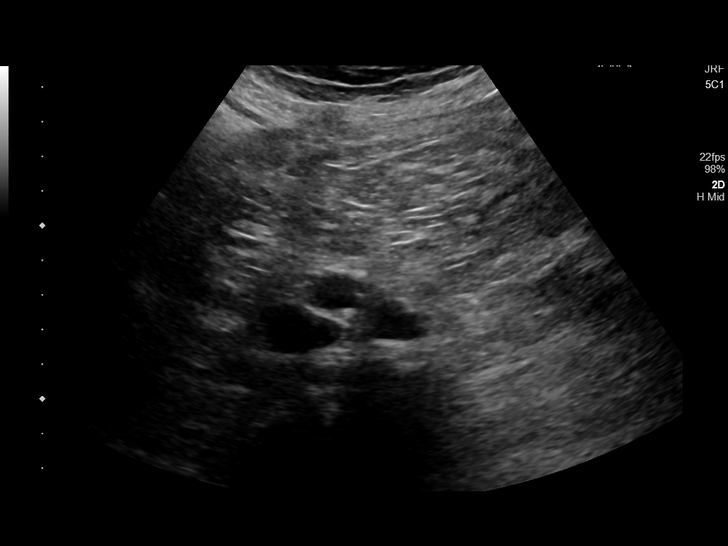
[im 27/40]
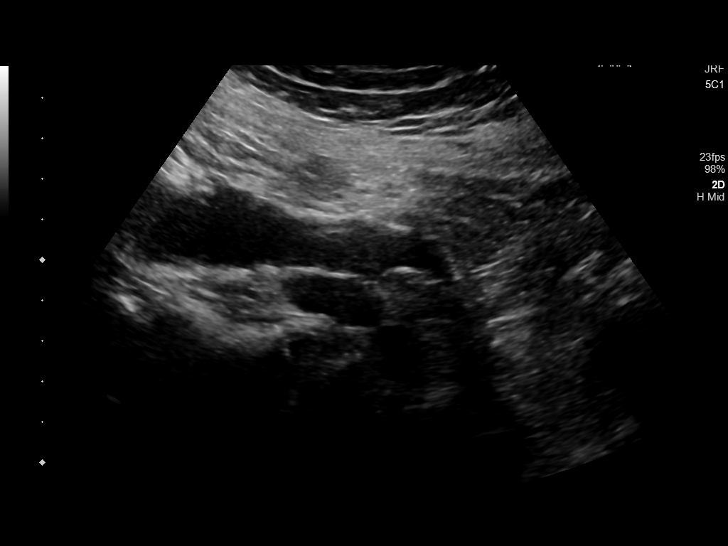
[im 30/40]
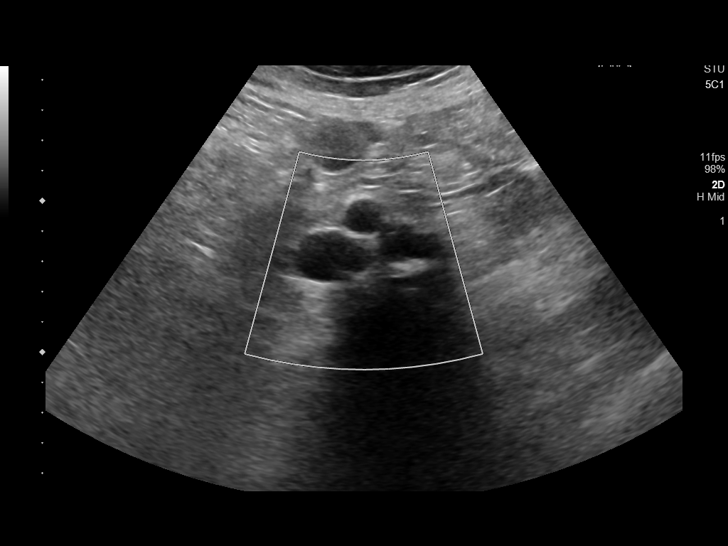
[im 33/40]
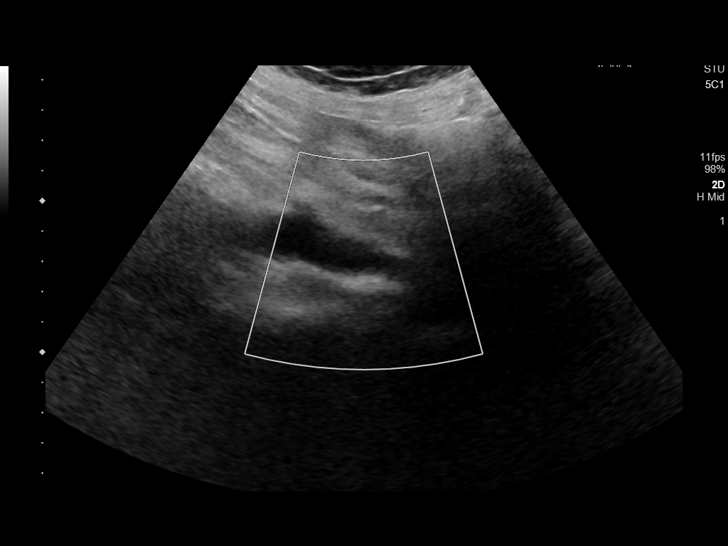
[im 36/40]
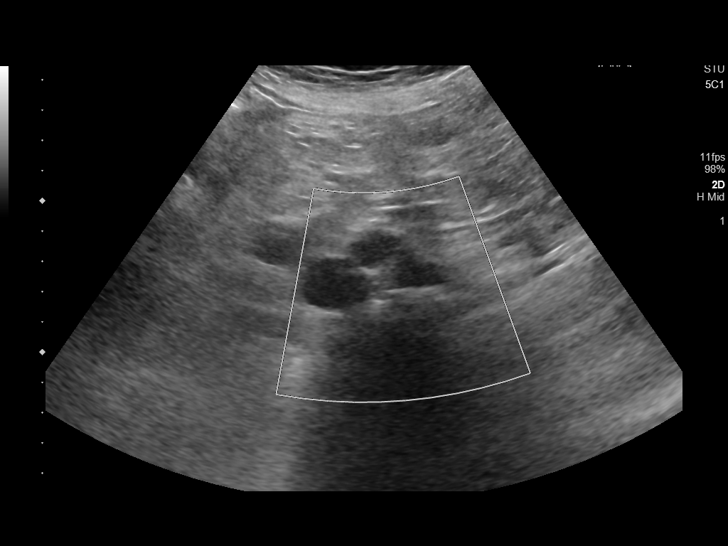
[im 40/40]
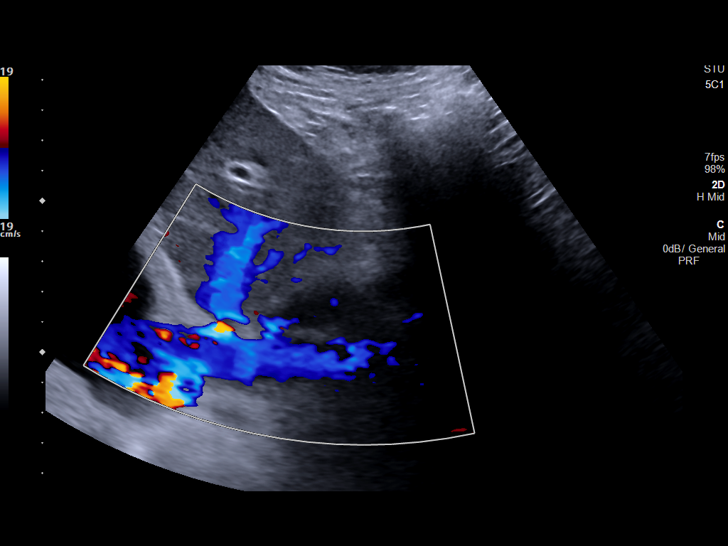

[14 of 25 positions shown; findings below may reference images not displayed]

FINDINGS: Abdominal aortic measurements as follows:

Proximal:  3.3 cm

Mid:  2.9 cm

Distal:  3.2 cm

Right common iliac artery: 1.6 cm

Left common iliac artery: 1.5 cm

Aorta: Echogenic calcifications in the infrarenal abdominal aorta
consistent with atherosclerotic disease. The aorta is patent with a
peak systolic velocity of 104 centimeters/second.
IMPRESSION: 1. Positive for infrarenal abdominal aortic aneurysm with a maximal
diameter of 3.2 cm. Recommend followup by ultrasound in 3 years.
This recommendation follows ACR consensus guidelines: White Paper of
the ACR Incidental Findings Committee II on Vascular Findings. [HOSPITAL] 1011; [DATE]. Aortic aneurysm NOS (2HQSC-9A5.X).
2. Ectatic right common iliac artery measuring up to 1.6 cm.
3. Ectatic left common iliac artery measuring up to 1.5 cm.
4.  Aortic Atherosclerosis (2HQSC-170.0).

## 2019-04-02 ENCOUNTER — Ambulatory Visit: Payer: Medicare Other | Admitting: Podiatry

## 2019-04-12 ENCOUNTER — Other Ambulatory Visit: Payer: Self-pay

## 2019-04-12 ENCOUNTER — Encounter: Payer: Self-pay | Admitting: Podiatry

## 2019-04-12 ENCOUNTER — Ambulatory Visit (INDEPENDENT_AMBULATORY_CARE_PROVIDER_SITE_OTHER): Payer: Medicare Other | Admitting: Podiatry

## 2019-04-12 VITALS — Temp 98.4°F

## 2019-04-12 DIAGNOSIS — M79676 Pain in unspecified toe(s): Secondary | ICD-10-CM | POA: Diagnosis not present

## 2019-04-12 DIAGNOSIS — B351 Tinea unguium: Secondary | ICD-10-CM | POA: Diagnosis not present

## 2019-04-12 DIAGNOSIS — E1142 Type 2 diabetes mellitus with diabetic polyneuropathy: Secondary | ICD-10-CM

## 2019-04-12 DIAGNOSIS — D689 Coagulation defect, unspecified: Secondary | ICD-10-CM

## 2019-04-12 NOTE — Progress Notes (Signed)
Complaint:  Visit Type: Patient returns to my office for continued preventative foot care services. Complaint: Patient states" my nails have grown long and thick and become painful to walk and wear shoes" Patient has been diagnosed with DM with no foot complications. The patient presents for preventative foot care services. No changes to ROS.  Patient on eliquiss..  Podiatric Exam: Vascular: dorsalis pedis and posterior tibial pulses are palpable bilateral. Capillary return is immediate. Temperature gradient is WNL. Skin turgor WNL  Sensorium: Diminished  Semmes Weinstein monofilament test. Normal tactile sensation bilaterally. Nail Exam: Pt has thick disfigured discolored nails with subungual debris noted bilateral entire nail hallux through fifth toenails Ulcer Exam: There is no evidence of ulcer or pre-ulcerative changes or infection. Orthopedic Exam: Muscle tone and strength are WNL. No limitations in general ROM. No crepitus or effusions noted. Foot type and digits show no abnormalities. HAV   B/L Skin: No Porokeratosis. No infection or ulcers.    Diagnosis:  Onychomycosis, , Pain in right toe, pain in left toes  Treatment & Plan Procedures and Treatment: Consent by patient was obtained for treatment procedures. The patient understood the discussion of treatment and procedures well. All questions were answered thoroughly reviewed. Debridement of mycotic and hypertrophic toenails, 1 through 5 bilateral and clearing of subungual debris. No ulceration, no infection noted.  Return Visit-Office Procedure: Patient instructed to return to the office for a follow up visit 3 months for continued evaluation and treatment.    Enmanuel Zufall DPM 

## 2019-05-26 ENCOUNTER — Emergency Department: Payer: Medicare Other

## 2019-05-26 ENCOUNTER — Other Ambulatory Visit: Payer: Self-pay

## 2019-05-26 ENCOUNTER — Encounter: Payer: Self-pay | Admitting: Emergency Medicine

## 2019-05-26 ENCOUNTER — Inpatient Hospital Stay
Admission: EM | Admit: 2019-05-26 | Discharge: 2019-05-28 | DRG: 291 | Disposition: A | Discharge status: Home / Self Care | Payer: Medicare Other | Attending: Internal Medicine | Admitting: Internal Medicine

## 2019-05-26 DIAGNOSIS — I4821 Permanent atrial fibrillation: Secondary | ICD-10-CM | POA: Diagnosis present

## 2019-05-26 DIAGNOSIS — E8779 Other fluid overload: Secondary | ICD-10-CM

## 2019-05-26 DIAGNOSIS — Z7951 Long term (current) use of inhaled steroids: Secondary | ICD-10-CM

## 2019-05-26 DIAGNOSIS — E1122 Type 2 diabetes mellitus with diabetic chronic kidney disease: Secondary | ICD-10-CM | POA: Diagnosis present

## 2019-05-26 DIAGNOSIS — Z79899 Other long term (current) drug therapy: Secondary | ICD-10-CM

## 2019-05-26 DIAGNOSIS — N183 Chronic kidney disease, stage 3 (moderate): Secondary | ICD-10-CM | POA: Diagnosis present

## 2019-05-26 DIAGNOSIS — Z1159 Encounter for screening for other viral diseases: Secondary | ICD-10-CM

## 2019-05-26 DIAGNOSIS — Z87891 Personal history of nicotine dependence: Secondary | ICD-10-CM

## 2019-05-26 DIAGNOSIS — Z7901 Long term (current) use of anticoagulants: Secondary | ICD-10-CM | POA: Diagnosis not present

## 2019-05-26 DIAGNOSIS — Z7952 Long term (current) use of systemic steroids: Secondary | ICD-10-CM | POA: Diagnosis not present

## 2019-05-26 DIAGNOSIS — I361 Nonrheumatic tricuspid (valve) insufficiency: Secondary | ICD-10-CM | POA: Diagnosis not present

## 2019-05-26 DIAGNOSIS — I5033 Acute on chronic diastolic (congestive) heart failure: Secondary | ICD-10-CM | POA: Diagnosis present

## 2019-05-26 DIAGNOSIS — Z7982 Long term (current) use of aspirin: Secondary | ICD-10-CM | POA: Diagnosis not present

## 2019-05-26 DIAGNOSIS — E7800 Pure hypercholesterolemia, unspecified: Secondary | ICD-10-CM | POA: Diagnosis present

## 2019-05-26 DIAGNOSIS — E785 Hyperlipidemia, unspecified: Secondary | ICD-10-CM | POA: Diagnosis present

## 2019-05-26 DIAGNOSIS — Z794 Long term (current) use of insulin: Secondary | ICD-10-CM

## 2019-05-26 DIAGNOSIS — R0602 Shortness of breath: Secondary | ICD-10-CM | POA: Diagnosis not present

## 2019-05-26 DIAGNOSIS — Z66 Do not resuscitate: Secondary | ICD-10-CM | POA: Diagnosis present

## 2019-05-26 DIAGNOSIS — J9601 Acute respiratory failure with hypoxia: Secondary | ICD-10-CM | POA: Diagnosis present

## 2019-05-26 DIAGNOSIS — I2510 Atherosclerotic heart disease of native coronary artery without angina pectoris: Secondary | ICD-10-CM | POA: Diagnosis present

## 2019-05-26 DIAGNOSIS — I130 Hypertensive heart and chronic kidney disease with heart failure and stage 1 through stage 4 chronic kidney disease, or unspecified chronic kidney disease: Principal | ICD-10-CM | POA: Diagnosis present

## 2019-05-26 DIAGNOSIS — Z951 Presence of aortocoronary bypass graft: Secondary | ICD-10-CM | POA: Diagnosis not present

## 2019-05-26 DIAGNOSIS — M109 Gout, unspecified: Secondary | ICD-10-CM | POA: Diagnosis present

## 2019-05-26 DIAGNOSIS — I340 Nonrheumatic mitral (valve) insufficiency: Secondary | ICD-10-CM | POA: Diagnosis not present

## 2019-05-26 DIAGNOSIS — Z8249 Family history of ischemic heart disease and other diseases of the circulatory system: Secondary | ICD-10-CM

## 2019-05-26 DIAGNOSIS — J90 Pleural effusion, not elsewhere classified: Secondary | ICD-10-CM

## 2019-05-26 LAB — COMPREHENSIVE METABOLIC PANEL
ALT: 15 U/L (ref 0–44)
AST: 29 U/L (ref 15–41)
Albumin: 3.8 g/dL (ref 3.5–5.0)
Alkaline Phosphatase: 80 U/L (ref 38–126)
Anion gap: 9 (ref 5–15)
BUN: 29 mg/dL — ABNORMAL HIGH (ref 8–23)
CO2: 26 mmol/L (ref 22–32)
Calcium: 9.1 mg/dL (ref 8.9–10.3)
Chloride: 105 mmol/L (ref 98–111)
Creatinine, Ser: 1.54 mg/dL — ABNORMAL HIGH (ref 0.61–1.24)
GFR calc Af Amer: 51 mL/min — ABNORMAL LOW (ref >60)
GFR calc non Af Amer: 44 mL/min — ABNORMAL LOW (ref >60)
Glucose, Bld: 93 mg/dL (ref 70–99)
Potassium: 4.5 mmol/L (ref 3.5–5.1)
Sodium: 140 mmol/L (ref 135–145)
Total Bilirubin: 2.1 mg/dL — ABNORMAL HIGH (ref 0.3–1.2)
Total Protein: 7.8 g/dL (ref 6.5–8.1)

## 2019-05-26 LAB — CBC WITH DIFFERENTIAL/PLATELET
Abs Immature Granulocytes: 0.05 10*3/uL (ref 0.00–0.07)
Basophils Absolute: 0.1 10*3/uL (ref 0.0–0.1)
Basophils Relative: 1 %
Eosinophils Absolute: 0.2 10*3/uL (ref 0.0–0.5)
Eosinophils Relative: 2 %
HCT: 42.1 % (ref 39.0–52.0)
Hemoglobin: 14 g/dL (ref 13.0–17.0)
Immature Granulocytes: 1 %
Lymphocytes Relative: 15 %
Lymphs Abs: 1.4 10*3/uL (ref 0.7–4.0)
MCH: 31.4 pg (ref 26.0–34.0)
MCHC: 33.3 g/dL (ref 30.0–36.0)
MCV: 94.4 fL (ref 80.0–100.0)
Monocytes Absolute: 0.7 10*3/uL (ref 0.1–1.0)
Monocytes Relative: 7 %
Neutro Abs: 7.4 10*3/uL (ref 1.7–7.7)
Neutrophils Relative %: 74 %
Platelets: 217 10*3/uL (ref 150–400)
RBC: 4.46 MIL/uL (ref 4.22–5.81)
RDW: 14.2 % (ref 11.5–15.5)
WBC: 9.8 10*3/uL (ref 4.0–10.5)
nRBC: 0 % (ref 0.0–0.2)

## 2019-05-26 LAB — GLUCOSE, CAPILLARY
Glucose-Capillary: 44 mg/dL — CL (ref 70–99)
Glucose-Capillary: 63 mg/dL — ABNORMAL LOW (ref 70–99)
Glucose-Capillary: 148 mg/dL — ABNORMAL HIGH (ref 70–99)
Glucose-Capillary: 85 mg/dL (ref 70–99)

## 2019-05-26 LAB — BRAIN NATRIURETIC PEPTIDE: B Natriuretic Peptide: 966 pg/mL — ABNORMAL HIGH (ref 0.0–100.0)

## 2019-05-26 LAB — TROPONIN I (HIGH SENSITIVITY): Troponin I (High Sensitivity): 109 ng/L (ref <18)

## 2019-05-26 LAB — SARS CORONAVIRUS 2 BY RT PCR (HOSPITAL ORDER, PERFORMED IN ~~LOC~~ HOSPITAL LAB): SARS Coronavirus 2: NEGATIVE

## 2019-05-26 MED ORDER — IRBESARTAN 150 MG PO TABS
300.0000 mg | ORAL_TABLET | Freq: Every day | ORAL | Status: DC
  Administered 2019-05-27 – 2019-05-28 (×2): 300 mg via ORAL
  Filled 2019-05-26 (×2): qty 2

## 2019-05-26 MED ORDER — GABAPENTIN 100 MG PO CAPS
100.0000 mg | ORAL_CAPSULE | Freq: Three times a day (TID) | ORAL | Status: DC
  Administered 2019-05-26 – 2019-05-28 (×5): 100 mg via ORAL
  Filled 2019-05-26 (×5): qty 1

## 2019-05-26 MED ORDER — SENNOSIDES-DOCUSATE SODIUM 8.6-50 MG PO TABS: 1.0000 | ORAL_TABLET | Freq: Every evening | ORAL | Status: DC | PRN

## 2019-05-26 MED ORDER — ALBUTEROL SULFATE (2.5 MG/3ML) 0.083% IN NEBU: 2.5000 mg | INHALATION_SOLUTION | RESPIRATORY_TRACT | Status: DC | PRN

## 2019-05-26 MED ORDER — COLCHICINE 0.6 MG PO TABS
0.6000 mg | ORAL_TABLET | Freq: Three times a day (TID) | ORAL | Status: DC
  Administered 2019-05-26 – 2019-05-28 (×5): 0.6 mg via ORAL
  Filled 2019-05-26 (×5): qty 1

## 2019-05-26 MED ORDER — BISACODYL 5 MG PO TBEC: 5.0000 mg | DELAYED_RELEASE_TABLET | Freq: Every day | ORAL | Status: DC | PRN

## 2019-05-26 MED ORDER — ACETAMINOPHEN 650 MG RE SUPP: 650.0000 mg | Freq: Four times a day (QID) | RECTAL | Status: DC | PRN

## 2019-05-26 MED ORDER — FUROSEMIDE 10 MG/ML IJ SOLN
40.0000 mg | Freq: Once | INTRAMUSCULAR | Status: AC
  Administered 2019-05-26: 40 mg via INTRAVENOUS
  Filled 2019-05-26: qty 4

## 2019-05-26 MED ORDER — HYDROCODONE-ACETAMINOPHEN 5-325 MG PO TABS: 1.0000 | ORAL_TABLET | ORAL | Status: DC | PRN

## 2019-05-26 MED ORDER — INSULIN ASPART 100 UNIT/ML ~~LOC~~ SOLN
0.0000 [IU] | Freq: Three times a day (TID) | SUBCUTANEOUS | Status: DC
  Administered 2019-05-27: 2 [IU] via SUBCUTANEOUS
  Administered 2019-05-27: 1 [IU] via SUBCUTANEOUS
  Administered 2019-05-28: 5 [IU] via SUBCUTANEOUS
  Filled 2019-05-26 (×2): qty 1

## 2019-05-26 MED ORDER — SODIUM CHLORIDE 0.9% FLUSH
3.0000 mL | INTRAVENOUS | Status: DC | PRN
  Administered 2019-05-28: 3 mL via INTRAVENOUS
  Filled 2019-05-26: qty 3

## 2019-05-26 MED ORDER — INSULIN ASPART 100 UNIT/ML ~~LOC~~ SOLN
0.0000 [IU] | Freq: Every day | SUBCUTANEOUS | Status: DC
  Administered 2019-05-27: 3 [IU] via SUBCUTANEOUS
  Filled 2019-05-26: qty 1

## 2019-05-26 MED ORDER — ONDANSETRON HCL 4 MG/2ML IJ SOLN: 4.0000 mg | Freq: Four times a day (QID) | INTRAMUSCULAR | Status: DC | PRN

## 2019-05-26 MED ORDER — METOPROLOL SUCCINATE ER 25 MG PO TB24
25.0000 mg | ORAL_TABLET | Freq: Every day | ORAL | Status: DC
  Administered 2019-05-27 – 2019-05-28 (×2): 25 mg via ORAL
  Filled 2019-05-26 (×2): qty 1

## 2019-05-26 MED ORDER — FUROSEMIDE 10 MG/ML IJ SOLN
40.0000 mg | Freq: Two times a day (BID) | INTRAMUSCULAR | Status: DC
  Administered 2019-05-26 – 2019-05-28 (×4): 40 mg via INTRAVENOUS
  Filled 2019-05-26 (×4): qty 4

## 2019-05-26 MED ORDER — ATORVASTATIN CALCIUM 20 MG PO TABS
80.0000 mg | ORAL_TABLET | Freq: Every day | ORAL | Status: DC
  Administered 2019-05-26 – 2019-05-27 (×2): 80 mg via ORAL
  Filled 2019-05-26 (×2): qty 4

## 2019-05-26 MED ORDER — MOMETASONE FURO-FORMOTEROL FUM 200-5 MCG/ACT IN AERO
2.0000 | INHALATION_SPRAY | Freq: Two times a day (BID) | RESPIRATORY_TRACT | Status: DC
  Administered 2019-05-26 – 2019-05-28 (×4): 2 via RESPIRATORY_TRACT
  Filled 2019-05-26: qty 8.8

## 2019-05-26 MED ORDER — SODIUM CHLORIDE 0.9% FLUSH
3.0000 mL | Freq: Two times a day (BID) | INTRAVENOUS | Status: DC
  Administered 2019-05-26 – 2019-05-28 (×3): 3 mL via INTRAVENOUS

## 2019-05-26 MED ORDER — TIOTROPIUM BROMIDE MONOHYDRATE 18 MCG IN CAPS
18.0000 ug | ORAL_CAPSULE | Freq: Every day | RESPIRATORY_TRACT | Status: DC
  Administered 2019-05-26 – 2019-05-28 (×3): 18 ug via RESPIRATORY_TRACT
  Filled 2019-05-26: qty 5

## 2019-05-26 MED ORDER — ASPIRIN 81 MG PO CHEW
81.0000 mg | CHEWABLE_TABLET | Freq: Every day | ORAL | Status: DC
  Administered 2019-05-27 – 2019-05-28 (×2): 81 mg via ORAL
  Filled 2019-05-26 (×2): qty 1

## 2019-05-26 MED ORDER — APIXABAN 5 MG PO TABS
5.0000 mg | ORAL_TABLET | Freq: Two times a day (BID) | ORAL | Status: DC
  Administered 2019-05-26 – 2019-05-28 (×4): 5 mg via ORAL
  Filled 2019-05-26 (×4): qty 1

## 2019-05-26 MED ORDER — ONDANSETRON HCL 4 MG PO TABS: 4.0000 mg | ORAL_TABLET | Freq: Four times a day (QID) | ORAL | Status: DC | PRN

## 2019-05-26 MED ORDER — ACETAMINOPHEN 325 MG PO TABS: 650.0000 mg | ORAL_TABLET | Freq: Four times a day (QID) | ORAL | Status: DC | PRN

## 2019-05-26 MED ORDER — DIGOXIN 125 MCG PO TABS
0.1250 mg | ORAL_TABLET | Freq: Every day | ORAL | Status: DC
  Administered 2019-05-27 – 2019-05-28 (×2): 0.125 mg via ORAL
  Filled 2019-05-26 (×2): qty 1

## 2019-05-26 MED ORDER — COLCHICINE 0.6 MG PO TABS: 0.6000 mg | ORAL_TABLET | Freq: Two times a day (BID) | ORAL | Status: DC

## 2019-05-26 MED ORDER — SODIUM CHLORIDE 0.9 % IV SOLN: 250.0000 mL | INTRAVENOUS | Status: DC | PRN

## 2019-05-26 NOTE — H&P (Signed)
[facility] - [place] [facility]   PATIENT NAME: Jeremiah Chapman    MR#:  [id number]  DATE OF BIRTH:  07/06/1946  DATE OF ADMISSION:  05/26/2019  PRIMARY CARE PHYSICIAN: [name], David, MD   REQUESTING/REFERRING PHYSICIAN: Dr. Fanny Bienquale.  CHIEF COMPLAINT:   Chief Complaint  Patient presents with   Shortness of Breath   Leg Swelling   Worsening shortness of breath for 4 days. HISTORY OF PRESENT ILLNESS:  Jeremiah ChafeKenneth Chapman  is a 73 y.o. male with a known history of A. fib, CAD, CHF, hypertension, diabetes, hyperlipidemia and gout.  The patient presents the ED with above chief complaints.  He complains of shortness of breath, cough and leg swelling worsening for 4 days.  He also has orthopnea and nocturnal dyspnea.  He is found hypoxia in the ED and put on oxygen by nasal cannula.  Chest x-ray report pleural effusion on the right side.  The patient is treated with Lasix and Dr. calling request mission for CHF. PAST MEDICAL HISTORY:   Past Medical History:  Diagnosis Date   Atrial fibrillation (HCC)    CAD (coronary artery disease)    CHF (congestive heart failure) (HCC)    Diabetes mellitus without complication (HCC)    Gout    Hypercholesterolemia    Hypertension     PAST SURGICAL HISTORY:   Past Surgical History:  Procedure Laterality Date   CARDIAC CATHETERIZATION     CARDIOVERSION N/A 01/20/2017   Procedure: CARDIOVERSION;  Surgeon: Marcina MillardAlexander Paraschos, MD;  Location: [facility] ORS;  Service: Cardiovascular;  Laterality: N/A;   COLONOSCOPY     CORONARY ARTERY BYPASS GRAFT     CORONARY STENT INTERVENTION N/A 03/28/2017   Procedure: Coronary Stent Intervention;  Surgeon: Marcina MillardAlexander Paraschos, MD;  Location: [facility] INVASIVE CV LAB;  Service: Cardiovascular;  Laterality: N/A;   LEFT HEART CATH AND CORONARY ANGIOGRAPHY N/A 03/28/2017   Procedure: Left Heart Cath and Coronary Angiography;  Surgeon: Marcina MillardAlexander Paraschos, MD;  Location: [facility] INVASIVE CV  LAB;  Service: Cardiovascular;  Laterality: N/A;   limbar back surgery     PENILE PROSTHESIS IMPLANT      SOCIAL HISTORY:   Social History   Tobacco Use   Smoking status: Former Smoker    Packs/day: 0.50    Years: 50.00    Pack years: 25.00    Types: Cigarettes   Smokeless tobacco: Current User  Substance Use Topics   Alcohol use: No    Alcohol/week: 0.0 standard drinks    FAMILY HISTORY:   Family History  Problem Relation Age of Onset   Hypertension Mother     DRUG ALLERGIES:   Allergies  Allergen Reactions   Sitagliptin Rash and Other (See Comments)    Januvia    REVIEW OF SYSTEMS:   Review of Systems  Constitutional: Positive for malaise/fatigue. Negative for chills and fever.  HENT: Negative for sore throat.   Eyes: Negative for blurred vision and double vision.  Respiratory: Negative for cough, hemoptysis, shortness of breath, wheezing and stridor.   Cardiovascular: Positive for orthopnea and leg swelling. Negative for chest pain and palpitations.  Gastrointestinal: Negative for abdominal pain, blood in stool, diarrhea, melena, nausea and vomiting.  Genitourinary: Negative for dysuria, flank pain and hematuria.  Musculoskeletal: Negative for back pain and joint pain.  Skin: Negative for rash.  Neurological: Negative for dizziness, sensory change, focal weakness, seizures, loss of consciousness, weakness and headaches.  Endo/Heme/Allergies: Negative for polydipsia.  Psychiatric/Behavioral: Negative for depression. The patient is  not nervous/anxious.     MEDICATIONS AT HOME:   Prior to Admission medications   Medication Sig Start Date End Date Taking? Authorizing Provider  apixaban (ELIQUIS) 5 MG TABS tablet Take by mouth. 11/08/18   [provider]  aspirin 81 MG chewable tablet Chew by mouth. 06/23/17   [provider]  atorvastatin (LIPITOR) 80 MG tablet Take by mouth. 06/23/17   [provider]  B-D ULTRAFINE III SHORT  PEN 31G X 8 MM MISC 2 (two) times daily. 11/24/18   [provider]  budesonide-formoterol (SYMBICORT) 160-4.5 MCG/ACT inhaler Inhale into the lungs. 08/15/17 08/15/18  [provider]  chlorhexidine (PERIDEX) 0.12 % solution See admin instructions. 10/23/18   [provider]  colchicine 0.6 MG tablet Take 1 tablet TID until GI upset or flare subsides, then for maintenance, take 1 tablet daily. 05/18/17   Hyatt, Max T, DPM  digoxin (LANOXIN) 0.125 MG tablet Take 1 tablet (0.125 mg total) by mouth daily. 04/23/17   Auburn BilberryPatel, Shreyang, MD  FLUZONE HIGH-DOSE 0.5 ML injection inject 0.5 milliliter intramuscularly 07/26/17   [provider]  furosemide (LASIX) 20 MG tablet Take 20 mg by mouth daily. 08/22/17   [provider]  gabapentin (NEURONTIN) 100 MG capsule TAKE 1 CAPSULE BY MOUTH WITH SUPPER AND 3 CAPSULES AT BEDTIME 11/27/18   [provider]  glimepiride (AMARYL) 4 MG tablet TK 1 T PO QD WITH BRE 09/02/18   [provider]  glyBURIDE (DIABETA) 2.5 MG tablet Take by mouth. 09/02/17   [provider]  insulin aspart (NOVOLOG) 100 UNIT/ML injection  04/26/17   [provider]  Insulin Degludec 200 UNIT/ML SOPN Inject into the skin. 10/24/18   [provider]  irbesartan (AVAPRO) 300 MG tablet Take by mouth. 06/23/17   [provider]  LEVEMIR FLEXTOUCH 100 UNIT/ML Pen  08/18/17   [provider]  metFORMIN (GLUCOPHAGE-XR) 500 MG 24 hr tablet Take by mouth. 11/02/17 11/02/18  [provider]  metFORMIN (GLUCOPHAGE-XR) 500 MG 24 hr tablet TK 2 TS PO BID 11/24/18   [provider]  metoprolol succinate (TOPROL-XL) 25 MG 24 hr tablet Take by mouth. 06/23/17   [provider]  OZEMPIC 0.25 or 0.5 MG/DOSE SOPN INJECT 0.5 MG INTO THE SKIN Q 7 DAYS. 05/30/18   [provider]  SPIRIVA HANDIHALER 18 MCG inhalation capsule Place 18 mcg into inhaler and inhale daily. 04/11/17    [provider]  triamcinolone cream (KENALOG) 0.1 %  11/08/18   [provider]      VITAL SIGNS:  Blood pressure (!) 165/75, pulse 81, temperature 99 F (37.2 C), temperature source Oral, resp. rate (!) 31, height 5\' 10"  (1.778 m), weight 98 kg, SpO2 92 %.  PHYSICAL EXAMINATION:  Physical Exam  GENERAL:  73 y.o.-year-old patient lying in the bed with no acute distress.  EYES: Pupils equal, round, reactive to light and accommodation. No scleral icterus. Extraocular muscles intact.  HEENT: Head atraumatic, normocephalic. Oropharynx and nasopharynx clear.  NECK:  Supple, no jugular venous distention. No thyroid enlargement, no tenderness.  LUNGS: Normal breath sounds bilaterally, no wheezing, basilar rales, no rhonchi or crepitation. No use of accessory muscles of respiration.  CARDIOVASCULAR: S1, S2 normal. No murmurs, rubs, or gallops.  ABDOMEN: Soft, nontender, nondistended. Bowel sounds present. No organomegaly or mass.  EXTREMITIES: No cyanosis, or clubbing.  Bilateral ankle edema. NEUROLOGIC: Cranial nerves II through XII are intact. Muscle strength 5/5 in all extremities. Sensation intact.  Gait not checked.  PSYCHIATRIC: The patient is alert and oriented x 3.  SKIN: No obvious rash, lesion, or ulcer.   LABORATORY PANEL:   CBC Recent Labs  Lab 05/26/19 1054  WBC 9.8  HGB 14.0  HCT 42.1  PLT 217   ------------------------------------------------------------------------------------------------------------------  Chemistries  Recent Labs  Lab 05/26/19 1054  NA 140  K 4.5  CL 105  CO2 26  GLUCOSE 93  BUN 29*  CREATININE 1.54*  CALCIUM 9.1  AST 29  ALT 15  ALKPHOS 80  BILITOT 2.1*   ------------------------------------------------------------------------------------------------------------------  Cardiac Enzymes No results for input(s): TROPONINI in the last 168  hours. ------------------------------------------------------------------------------------------------------------------  RADIOLOGY:  Dg Chest 1 View  Result Date: 05/26/2019 CLINICAL DATA:  Shortness of breath and leg swelling EXAM: CHEST  1 VIEW COMPARISON:  08/26/2017 is the most recent available comparison FINDINGS: Small right pleural effusion with partially obscured lower lobe. No edema or pneumothorax. Chronic cardiomegaly. 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 IMPRESSION: 1. Small right pleural effusion with opacified/obscured lower lobe. 2. Cardiomegaly. 3. No pulmonary edema. Electronically Signed   By: Monte Fantasia M.D.   On: 05/26/2019 11:11      IMPRESSION AND PLAN:   Acute respiratory failure with hypoxia due to acute on chronic diastolic CHF LV EF: 11% -   55% The patient will be admitted to telemetry floor. Continue Lasix IV every 12 hours, CHF protocol.  Echocardiogram.  Right pleural effusion.  As above. Chronic A. fib.  Rate controlled, continue Eliquis and digoxin. Hypertension.  Continue home hypertension medication. Diabetes.  Start sliding scale hold metformin. CKD stage III.  Stable. All the records are reviewed and case discussed with ED provider. Management plans discussed with the patient, family and they are in agreement.  CODE STATUS:   TOTAL TIME TAKING CARE OF THIS PATIENT:  minutes.    Demetrios Loll M.D on 05/26/2019 at 2:02 PM  Between 7am to 6pm - Pager - [phone]  After 6pm go to [email or web address] - Proofreader  [facility]  Office  [phone]  CC: Primary care physician; Juluis Pitch, MD   Note: This dictation was prepared with Dragon dictation along with smaller phrase technology. Any transcriptional errors that result from this process are unin

## 2019-05-26 NOTE — ED Provider Notes (Signed)
[facility] Emergency Department Provider Note   ____________________________________________   First MD Initiated Contact with Patient 05/26/19 1046     (approximate)  I have reviewed the triage vital signs and the nursing notes.   HISTORY  Chief Complaint Shortness of Breath and Leg Swelling    HPI Jeremiah Chapman is a 73 y.o. male here for evaluation of shortness of breath developing over the last 4 days  History of atrial fibrillation, on Eliquis taking regularly, coronary disease congestive heart failure and diabetes  Also associated with leg swelling for the last 4 days.  Increasing shortness of breath reports much worse if he tries to lay down and sleep   No chest pain.  Shortness of breath.  Worse with walking.  Does take furosemide daily.  Follows with cardiology.  History of congestive heart failure and A. fib.  Past Medical History:  Diagnosis Date   Atrial fibrillation ([place])    CAD (coronary artery disease)    CHF (congestive heart failure) ([place])    Diabetes mellitus without complication ([place])    Gout    Hypercholesterolemia    Hypertension     Patient Active Problem List   Diagnosis Date Noted   PNA (pneumonia) 04/21/2017   S/P cardiac catheterization 04/05/2017   Bradycardia 03/26/2017   Elevated troponin 03/26/2017   AKI (acute kidney injury) ([place]) 03/26/2017   SOB (shortness of breath) 04/15/2016   CAD (coronary artery disease) 10/21/2015   Hyperlipidemia 02/12/2015   Paresthesia of foot, bilateral 02/12/2015   Hyperlipidemia due to type 2 diabetes mellitus ([place]) 02/12/2015   H/O adenomatous polyp of colon 01/08/2015   Essential hypertension 10/14/2014   Type 2 diabetes mellitus without complication ([place]) 53/61/4431   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   Atrial fibrillation ([place]) 06/07/2014   Gout 06/07/2014    Past Surgical History:  Procedure Laterality Date   CARDIAC CATHETERIZATION      CARDIOVERSION N/A 01/20/2017   Procedure: CARDIOVERSION;  Surgeon: Isaias Cowman, MD;  Location: [facility] ORS;  Service: Cardiovascular;  Laterality: N/A;   COLONOSCOPY     CORONARY ARTERY BYPASS GRAFT     CORONARY STENT INTERVENTION N/A 03/28/2017   Procedure: Coronary Stent Intervention;  Surgeon: Isaias Cowman, MD;  Location: [place] [facility];  Service: Cardiovascular;  Laterality: N/A;   LEFT HEART CATH AND CORONARY ANGIOGRAPHY N/A 03/28/2017   Procedure: Left Heart Cath and Coronary Angiography;  Surgeon: Isaias Cowman, MD;  Location: [place] [facility];  Service: Cardiovascular;  Laterality: N/A;   limbar back surgery     PENILE PROSTHESIS IMPLANT      Prior to Admission medications   Medication Sig Start Date End Date Taking? Authorizing Provider  apixaban (ELIQUIS) 5 MG TABS tablet Take by mouth. 11/08/18   [provider]  aspirin 81 MG chewable tablet Chew by mouth. 06/23/17   [provider]  atorvastatin (LIPITOR) 80 MG tablet Take by mouth. 06/23/17   [provider]  B-D ULTRAFINE III SHORT PEN 31G X 8 MM MISC 2 (two) times daily. 11/24/18   [provider]  budesonide-formoterol (SYMBICORT) 160-4.5 MCG/ACT inhaler Inhale into the lungs. 08/15/17 08/15/18  [provider]  chlorhexidine (PERIDEX) 0.12 % solution See admin instructions. 10/23/18   [provider]  colchicine 0.6 MG tablet Take 1 tablet TID until GI upset or flare subsides, then for maintenance, take 1 tablet daily. 05/18/17   Hyatt, Max T, DPM  digoxin (LANOXIN) 0.125 MG tablet Take  1 tablet (0.125 mg total) by mouth daily. 04/23/17   Auburn BilberryPatel, Shreyang, MD  FLUZONE HIGH-DOSE 0.5 ML injection inject 0.5 milliliter intramuscularly 07/26/17   [provider]  furosemide (LASIX) 20 MG tablet Take 20 mg by mouth daily. 08/22/17   [provider]  gabapentin (NEURONTIN) 100 MG capsule TAKE 1 CAPSULE BY MOUTH WITH SUPPER AND 3 CAPSULES  AT BEDTIME 11/27/18   [provider]  glimepiride (AMARYL) 4 MG tablet TK 1 T PO QD WITH BRE 09/02/18   [provider]  glyBURIDE (DIABETA) 2.5 MG tablet Take by mouth. 09/02/17   [provider]  insulin aspart (NOVOLOG) 100 UNIT/ML injection  04/26/17   [provider]  Insulin Degludec 200 UNIT/ML SOPN Inject into the skin. 10/24/18   [provider]  irbesartan (AVAPRO) 300 MG tablet Take by mouth. 06/23/17   [provider]  LEVEMIR FLEXTOUCH 100 UNIT/ML Pen  08/18/17   [provider]  metFORMIN (GLUCOPHAGE-XR) 500 MG 24 hr tablet Take by mouth. 11/02/17 11/02/18  [provider]  metFORMIN (GLUCOPHAGE-XR) 500 MG 24 hr tablet TK 2 TS PO BID 11/24/18   [provider]  metoprolol succinate (TOPROL-XL) 25 MG 24 hr tablet Take by mouth. 06/23/17   [provider]  OZEMPIC 0.25 or 0.5 MG/DOSE SOPN INJECT 0.5 MG INTO THE SKIN Q 7 DAYS. 05/30/18   [provider]  SPIRIVA HANDIHALER 18 MCG inhalation capsule Place 18 mcg into inhaler and inhale daily. 04/11/17   [provider]  triamcinolone cream (KENALOG) 0.1 %  11/08/18   [provider]    Allergies Sitagliptin  Family History  Problem Relation Age of Onset   Hypertension Mother     Social History Social History   Tobacco Use   Smoking status: Former Smoker    Packs/day: 0.50    Years: 50.00    Pack years: 25.00    Types: Cigarettes   Smokeless tobacco: Current User  Substance Use Topics   Alcohol use: No    Alcohol/week: 0.0 standard drinks   Drug use: No    Review of Systems Constitutional: No fever/chills but fatigue Eyes: No visual changes. ENT: No sore throat. Cardiovascular: Denies chest pain. Respiratory: See HPI Gastrointestinal: No abdominal pain.  Belly does feel little bit swollen the last few days as well as his legs Genitourinary: Negative for dysuria. Musculoskeletal: Negative for back  pain. Skin: Negative for rash. Neurological: Negative for headaches, areas of focal weakness or numbness.    ____________________________________________   PHYSICAL EXAM:  VITAL SIGNS: ED Triage Vitals  Enc Vitals Group     BP 05/26/19 1029 (!) 147/76     Pulse Rate 05/26/19 1029 77     Resp 05/26/19 1124 (!) 22     Temp 05/26/19 1029 99 F (37.2 C)     Temp Source 05/26/19 1029 Oral     SpO2 05/26/19 1029 95 %     Weight 05/26/19 1017 216 lb (98 kg)     Height 05/26/19 1017 5\' 10"  (1.778 m)     Head Circumference --      Peak Flow --      Pain Score 05/26/19 1016 0     Pain Loc --      Pain Edu? --      Excl. in GC? --     Constitutional: Alert and oriented.  Mildly short of breath.  Speaks in phrases.  Slight tachypnea and minimal use of accessory muscles  Eyes: Conjunctivae are normal. Head: Atraumatic. Nose: No congestion/rhinnorhea. Mouth/Throat: Mucous membranes are moist. Neck: No stridor.  Cardiovascular: Normal rate, regular rhythm. Grossly normal heart sounds.  Good peripheral circulation. Respiratory: Diminished lung sounds bilateral.  Minimal use accessory muscles.  No acute distress however.  No wheezing Gastrointestinal: Soft and nontender. No distention. Musculoskeletal: No lower extremity tenderness but bilateral 3+ pitting edema Neurologic:  Normal speech and language. No gross focal neurologic deficits are appreciated.  Skin:  Skin is warm, dry and intact. No rash noted. Psychiatric: Mood and affect are normal. Speech and behavior are normal.  ____________________________________________   LABS (all labs ordered are listed, but only abnormal results are displayed)  Labs Reviewed  COMPREHENSIVE METABOLIC PANEL - Abnormal; Notable for the following components:      Result Value   BUN 29 (*)    Creatinine, Ser 1.54 (*)    Total Bilirubin 2.1 (*)    GFR calc non Af Amer 44 (*)    GFR calc Af Amer 51 (*)    All other components within normal  limits  BRAIN NATRIURETIC PEPTIDE - Abnormal; Notable for the following components:   B Natriuretic Peptide 966.0 (*)    All other components within normal limits  TROPONIN I (HIGH SENSITIVITY) - Abnormal; Notable for the following components:   Troponin I (High Sensitivity) 109 (*)    All other components within normal limits  SARS CORONAVIRUS 2 (HOSPITAL ORDER, PERFORMED IN [facility])  CBC WITH DIFFERENTIAL/PLATELET   ____________________________________________  EKG  Reviewed entered by me at 1030 Heart rate 75 QRS 150 QTc 500 Atrial fibrillation, right bundle branch block, some slight ST depressions anterior compared with previous.  Patient denies chest pain.  Troponin is elevated but based on multiple of 104 high-sensitivity, it appears to be consistent with previous troponins ____________________________________________  RADIOLOGY  Dg Chest 1 View  Result Date: 05/26/2019 CLINICAL DATA:  Shortness of breath and leg swelling EXAM: CHEST  1 VIEW COMPARISON:  08/26/2017 is the most recent available comparison FINDINGS: Small right pleural effusion with partially obscured lower lobe. No edema or pneumothorax. Chronic cardiomegaly. 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 IMPRESSION: 1. Small right pleural effusion with opacified/obscured lower lobe. 2. Cardiomegaly. 3. No pulmonary edema. Electronically Signed   By: Marnee SpringJonathon  Watts M.D.   On: 05/26/2019 11:11    X-ray reviewed.  Pleural effusion noted. ____________________________________________   PROCEDURES  Procedure(s) performed: None  Procedures  Critical Care performed: No  ____________________________________________   INITIAL IMPRESSION / ASSESSMENT AND PLAN / ED COURSE  Pertinent labs & imaging results that were available during my care of the patient were reviewed by me and considered in my medical decision making (see chart for details).   Dyspnea.  Progressive.  Positional.   Associated with edema and history of CHF.  No infectious symptoms.  Denies exposure to coronavirus cough cold or fevers.  Appears consistent with elevated BNP with volume overload, also pleural effusion developing likely secondary to CHF.  Given his increased work of breathing, patient placed on 2 L nasal cannula with some relief and also initiate IV Lasix.  Given associated pleural effusion with shortness of breath and his progressive edema as well as EKG changes compared with previous anticipate admission to the hospital.  COVID negative. Lissa MoralesKenneth D Matthews was evaluated in Emergency Department on 05/26/2019 for the symptoms described in the history of present illness. He was evaluated in the context of the global COVID-19 pandemic, which  necessitated consideration that the patient might be at risk for infection with the SARS-CoV-2 virus that causes COVID-19. Institutional protocols and algorithms that pertain to the evaluation of patients at risk for COVID-19 are in a state of rapid change based on information released by regulatory bodies including the CDC and federal and state organizations. These policies and algorithms were followed during the patient's care in the ED.   ----------------------------------------- 2:41 PM on 05/26/2019 -----------------------------------------  Patient diuresing.  Admission discussed with Dr. Imogene Burnhen of the hospitalist service      ____________________________________________   FINAL CLINICAL IMPRESSION(S) / ED DIAGNOSES  Final diagnoses:  Volume overload state of heart  Pleural effusion  Shortness of breath        Note:  This document was prepared using Dragon voice recognition software and may include unintentional dictation errors       Sharyn CreamerQuale, Kelton Bultman, MD 05/26/19 1441

## 2019-05-26 NOTE — ED Notes (Signed)
Pt spilled urinal, bedding and gown changed

## 2019-05-26 NOTE — ED Notes (Signed)
Pt may eat and drink per Dr. Bridgett Larsson. Pt given crackers and diet soda.

## 2019-05-26 NOTE — ED Notes (Addendum)
Date and time results received: 05/26/19 11:32  (use smartphrase ".now" to insert current time)  Test: Troponin Critical Value: 109  Name of Provider Notified: Quale  Orders Received? Or Actions Taken?:  Provider notified

## 2019-05-26 NOTE — Progress Notes (Signed)
Advanced Care Plan.  Purpose of Encounter: CODE STATUS. Parties in Attendance: The patient and me. Patient's Decisional Capacity: Yes. Medical Story: Jeremiah Chapman  is a 73 y.o. male with a known history of A. fib, CAD, CHF, hypertension, diabetes, hyperlipidemia and gout.  The patient is being admitted for acute respiratory failure with hypoxia due to acute on chronic diastolic CHF and pleural effusion.  I discussed with patient about his current condition, prognosis and CODE STATUS.  The patient does not want to be resuscitated and intubated if he has cardiopulmonary arrest. Plan:  Code Status: DNR. Time spent discussing advance care planning: 17 minutes.

## 2019-05-26 NOTE — ED Notes (Signed)
ED TO INPATIENT HANDOFF REPORT  ED Nurse Name and Phone #: Marisue HumbleMaureen [phone]  S Name/Age/Gender Jeremiah MoralesKenneth D Chapman 73 y.o. male Room/Bed: ED03A/ED03A  Code Status   Code Status: Prior  Home/SNF/Other Home Patient oriented to: self, place, time and situation Is this baseline? Yes   Triage Complete: Triage complete  Chief Complaint shortness of breath swelling l leg  [facility] shortness of breath and left leg swelling. Pt states that shortness of breath and leg swelling both started 4 days ago. Pt denies pain in leg. Pt denies hx/o blood clots   Allergies Allergies  Allergen Reactions  . Sitagliptin Rash and Other (See Comments)    Januvia    Level of Care/Admitting Diagnosis ED Disposition    ED Disposition Condition Comment   Admit  The patient appears reasonably stabilized for admission considering the current resources, flow, and capabilities available in the ED at this time, and I doubt any other [facility] requiring further screening and/or treatment in the ED prior to admission is  present.       B Medical/Surgery History Past Medical History:  Diagnosis Date  . Atrial fibrillation (HCC)   . CAD (coronary artery disease)   . CHF (congestive heart failure) (HCC)   . Diabetes mellitus without complication (HCC)   . Gout   . Hypercholesterolemia   . Hypertension    Past Surgical History:  Procedure Laterality Date  . CARDIAC CATHETERIZATION    . CARDIOVERSION N/A 01/20/2017   Procedure: CARDIOVERSION;  Surgeon: Marcina MillardAlexander Paraschos, MD;  Location: [facility] ORS;  Service: Cardiovascular;  Laterality: N/A;  . COLONOSCOPY    . CORONARY ARTERY BYPASS GRAFT    . CORONARY STENT INTERVENTION N/A 03/28/2017   Procedure: Coronary Stent Intervention;  Surgeon: Marcina MillardAlexander Paraschos, MD;  Location: [facility] INVASIVE CV LAB;  Service: Cardiovascular;  Laterality: N/A;  . LEFT HEART CATH AND CORONARY ANGIOGRAPHY N/A 03/28/2017   Procedure: Left Heart  Cath and Coronary Angiography;  Surgeon: Marcina MillardAlexander Paraschos, MD;  Location: [facility] INVASIVE CV LAB;  Service: Cardiovascular;  Laterality: N/A;  . limbar back surgery    . PENILE PROSTHESIS IMPLANT       A IV Location/Drains/Wounds Patient Lines/Drains/Airways Status   Active Line/Drains/Airways    None          Intake/Output Last 24 hours  Intake/Output Summary (Last 24 hours) at 05/26/2019 1406 Last data filed at 05/26/2019 1358 Gross per 24 hour  Intake -  Output 1200 ml  Net -1200 ml    Labs/Imaging Results for orders placed or performed during the hospital encounter of 05/26/19 (from the past 48 hour(s))  Brain natriuretic peptide     Status: Abnormal   Collection Time: 05/26/19 10:51 AM  Result Value Ref Range   B Natriuretic Peptide 966.0 (H) 0.0 - 100.0 pg/mL    Comment: Performed at [facility], [address] [place], [place] [address]  Troponin I (High Sensitivity)     Status: Abnormal   Collection Time: 05/26/19 10:51 AM  Result Value Ref Range   Troponin I (High Sensitivity) 109 (HH) <18 ng/L    Comment: CRITICAL RESULT CALLED TO, READ BACK BY AND VERIFIED WITH Noam Karaffa AT 1132 05/26/2019.PMF (NOTE) Elevated high sensitivity troponin I (hsTnI) values and significant  changes across serial measurements may suggest ACS but many other  chronic and acute conditions are known to elevate hsTnI results.  Refer to the "Links" section for chest pain algorithms and  additional  guidance. Performed at [facility], [address] [place], [place] [address]   CBC with Differential     Status: None   Collection Time: 05/26/19 10:54 AM  Result Value Ref Range   WBC 9.8 4.0 - 10.5 K/uL   RBC 4.46 4.22 - 5.81 MIL/uL   Hemoglobin 14.0 13.0 - 17.0 g/dL   HCT 42.1 39.0 - 52.0 %   MCV 94.4 80.0 - 100.0 fL   MCH 31.4 26.0 - 34.0 pg   MCHC 33.3 30.0 - 36.0 g/dL   RDW 14.2 11.5 - 15.5 %   Platelets 217 150 - 400 K/uL   nRBC 0.0 0.0 - 0.2 %    Neutrophils Relative % 74 %   Neutro Abs 7.4 1.7 - 7.7 K/uL   Lymphocytes Relative 15 %   Lymphs Abs 1.4 0.7 - 4.0 K/uL   Monocytes Relative 7 %   Monocytes Absolute 0.7 0.1 - 1.0 K/uL   Eosinophils Relative 2 %   Eosinophils Absolute 0.2 0.0 - 0.5 K/uL   Basophils Relative 1 %   Basophils Absolute 0.1 0.0 - 0.1 K/uL   Immature Granulocytes 1 %   Abs Immature Granulocytes 0.05 0.00 - 0.07 K/uL    Comment: Performed at [facility], [address] [place], [place] [address]  Comprehensive metabolic panel     Status: Abnormal   Collection Time: 05/26/19 10:54 AM  Result Value Ref Range   Sodium 140 135 - 145 mmol/L   Potassium 4.5 3.5 - 5.1 mmol/L    Comment: HEMOLYSIS AT THIS LEVEL MAY AFFECT RESULT   Chloride 105 98 - 111 mmol/L   CO2 26 22 - 32 mmol/L   Glucose, Bld 93 70 - 99 mg/dL   BUN 29 (H) 8 - 23 mg/dL   Creatinine, Ser 1.54 (H) 0.61 - 1.24 mg/dL   Calcium 9.1 8.9 - 10.3 mg/dL   Total Protein 7.8 6.5 - 8.1 g/dL   Albumin 3.8 3.5 - 5.0 g/dL   AST 29 15 - 41 U/L    Comment: HEMOLYSIS AT THIS LEVEL MAY AFFECT RESULT   ALT 15 0 - 44 U/L   Alkaline Phosphatase 80 38 - 126 U/L   Total Bilirubin 2.1 (H) 0.3 - 1.2 mg/dL    Comment: HEMOLYSIS AT THIS LEVEL MAY AFFECT RESULT   GFR calc non Af Amer 44 (L) >60 mL/min   GFR calc Af Amer 51 (L) >60 mL/min   Anion gap 9 5 - 15    Comment: Performed at [facility], [address] [place], [place] [address]  SARS Coronavirus 2 (CEPHEID- Performed in [place] hospital lab), Hosp Order     Status: None   Collection Time: 05/26/19 11:42 AM   Specimen: Nasopharyngeal Swab  Result Value Ref Range   SARS Coronavirus 2 NEGATIVE NEGATIVE    Comment: (NOTE) If result is NEGATIVE SARS-CoV-2 target nucleic acids are NOT DETECTED. The SARS-CoV-2 RNA is generally detectable in upper and lower  respiratory specimens during the acute phase of infection. The lowest  concentration of SARS-CoV-2 viral copies this assay  can detect is 250  copies / mL. A negative result does not preclude SARS-CoV-2 infection  and should not be used as the sole basis for treatment or other  patient management decisions.  A negative result may occur with  improper specimen collection / handling, submission of specimen other  than nasopharyngeal swab, presence of viral mutation(s) within the  areas targeted  by this assay, and inadequate number of viral copies  (<250 copies / mL). A negative result must be combined with clinical  observations, patient history, and epidemiological information. If result is POSITIVE SARS-CoV-2 target nucleic acids are DETECTED. The SARS-CoV-2 RNA is generally detectable in upper and lower  respiratory specimens dur ing the acute phase of infection.  Positive  results are indicative of active infection with SARS-CoV-2.  Clinical  correlation with patient history and other diagnostic information is  necessary to determine patient infection status.  Positive results do  not rule out bacterial infection or co-infection with other viruses. If result is PRESUMPTIVE POSTIVE SARS-CoV-2 nucleic acids MAY BE PRESENT.   A presumptive positive result was obtained on the submitted specimen  and confirmed on repeat testing.  While 2019 novel coronavirus  (SARS-CoV-2) nucleic acids may be present in the submitted sample  additional confirmatory testing may be necessary for epidemiological  and / or clinical management purposes  to differentiate between  SARS-CoV-2 and other Sarbecovirus currently known to infect humans.  If clinically indicated additional testing with an alternate test  methodology [phone] is advised. The SARS-CoV-2 RNA is generally  detectable in upper and lower respiratory sp ecimens during the acute  phase of infection. The expected result is Negative. Fact Sheet for Patients:  BoilerBrush.com.cyhttps://www.fda.gov/media/136312/download Fact Sheet for Healthcare  Providers: https://pope.com/https://www.fda.gov/media/136313/download This test is not yet approved or cleared by the [place] FDA and has been authorized for detection and/or diagnosis of SARS-CoV-2 by FDA under an Emergency Use Authorization (EUA).  This EUA will remain in effect (meaning this test can be used) for the duration of the COVID-19 declaration under Section 564(b)(1) of the Act, 21 U.S.C. section 360bbb-3(b)(1), unless the authorization is terminated or revoked sooner. Performed at [facility], [address] [place], [place] [address]    Dg Chest 1 View  Result Date: 05/26/2019 CLINICAL DATA:  Shortness of breath and leg swelling EXAM: CHEST  1 VIEW COMPARISON:  08/26/2017 is the most recent available comparison FINDINGS: Small right pleural effusion with partially obscured lower lobe. No edema or pneumothorax. Chronic cardiomegaly. 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 IMPRESSION: 1. Small right pleural effusion with opacified/obscured lower lobe. 2. Cardiomegaly. 3. No pulmonary edema. Electronically Signed   By: Marnee SpringJonathon  Watts M.D.   On: 05/26/2019 11:11    Pending Labs Unresulted Labs (From admission, onward)   None      Vitals/Pain Today's Vitals   05/26/19 1200 05/26/19 1211 05/26/19 1330 05/26/19 1400  BP: (!) 165/75  (!) 184/83 110/79  Pulse: 81  74   Resp: (!) 31  (!) 28 (!) 29  Temp:      TempSrc:      SpO2: 92%  98%   Weight:      Height:      PainSc:  0-No pain      Isolation Precautions No active isolations  Medications Medications  furosemide (LASIX) injection 40 mg (40 mg Intravenous Given 05/26/19 1143)    Mobility walks Low fall risk   Focused Assessments see assessments   R Recommendations: See Admitting Provider Note  Report given to:   Additional Notes:

## 2019-05-26 NOTE — ED Triage Notes (Signed)
Pt to ED from [facility] of shortness of breath and left leg swelling. Pt states that shortness of breath and leg swelling both started 4 days ago. Pt denies pain in leg. Pt denies hx/o blood clots

## 2019-05-26 NOTE — ED Notes (Signed)
Pt may drink per EDP. Pt given apple juice. Tolerating well.

## 2019-05-27 ENCOUNTER — Inpatient Hospital Stay (HOSPITAL_COMMUNITY)
Admit: 2019-05-27 | Discharge: 2019-05-27 | Disposition: A | Discharge status: Home / Self Care | Payer: Medicare Other | Attending: Internal Medicine | Admitting: Internal Medicine

## 2019-05-27 DIAGNOSIS — I361 Nonrheumatic tricuspid (valve) insufficiency: Secondary | ICD-10-CM

## 2019-05-27 DIAGNOSIS — I340 Nonrheumatic mitral (valve) insufficiency: Secondary | ICD-10-CM

## 2019-05-27 LAB — BASIC METABOLIC PANEL
Anion gap: 11 (ref 5–15)
BUN: 31 mg/dL — ABNORMAL HIGH (ref 8–23)
CO2: 29 mmol/L (ref 22–32)
Calcium: 9.1 mg/dL (ref 8.9–10.3)
Chloride: 101 mmol/L (ref 98–111)
Creatinine, Ser: 1.55 mg/dL — ABNORMAL HIGH (ref 0.61–1.24)
GFR calc Af Amer: 50 mL/min — ABNORMAL LOW (ref >60)
GFR calc non Af Amer: 43 mL/min — ABNORMAL LOW (ref >60)
Glucose, Bld: 210 mg/dL — ABNORMAL HIGH (ref 70–99)
Potassium: 3.7 mmol/L (ref 3.5–5.1)
Sodium: 141 mmol/L (ref 135–145)

## 2019-05-27 LAB — CBC
HCT: 45.2 % (ref 39.0–52.0)
Hemoglobin: 14.6 g/dL (ref 13.0–17.0)
MCH: 30.5 pg (ref 26.0–34.0)
MCHC: 32.3 g/dL (ref 30.0–36.0)
MCV: 94.6 fL (ref 80.0–100.0)
Platelets: 221 10*3/uL (ref 150–400)
RBC: 4.78 MIL/uL (ref 4.22–5.81)
RDW: 14.1 % (ref 11.5–15.5)
WBC: 10.7 10*3/uL — ABNORMAL HIGH (ref 4.0–10.5)
nRBC: 0 % (ref 0.0–0.2)

## 2019-05-27 LAB — GLUCOSE, CAPILLARY
Glucose-Capillary: 80 mg/dL (ref 70–99)
Glucose-Capillary: 127 mg/dL — ABNORMAL HIGH (ref 70–99)
Glucose-Capillary: 152 mg/dL — ABNORMAL HIGH (ref 70–99)
Glucose-Capillary: 261 mg/dL — ABNORMAL HIGH (ref 70–99)

## 2019-05-27 LAB — ECHOCARDIOGRAM COMPLETE
Height: 70 in
Weight: 3268.8 oz

## 2019-05-27 NOTE — Care Plan (Signed)
Diuresing very well

## 2019-05-27 NOTE — Consult Note (Signed)
[facility] Consultation [facility]  Patient ID: Jeremiah MoralesKenneth D Chapman, MRN: [id number], DOB/AGE: 75/02/1946 75 y.o. Admit date: 05/26/2019   Date of Consult: 05/27/2019 Primary Physician: [name], David, MD Primary Cardiologist: Paraschos  Chief Complaint:  Chief Complaint  Patient presents with  . Shortness of Breath  . Leg Swelling   Reason for Consult: Shortness of breath  HPI: 73 y.o. male with known permanent atrial fibrillation chronic kidney disease stage III coronary artery atherosclerosis essential hypertension and bundle branch block who has done fairly well on appropriate medication management.  The patient has had significant progression of shortness of breath weakness and fatigue over the last few days for which the patient has felt as if he has had some edema as well.  The patient subsequently was seen in the emergency room with an EKG showing atrial fibrillation with controlled ventricular rate and bundle branch block.  There was no evidence of myocardial infarction but chest x-ray did show pulmonary edema.  The patient was given intravenous Lasix with significant improvements of symptoms at this time.  The patient had an echocardiogram in 2018 showing that he had normal LV function with mild valvular heart disease.  Currently there is no chest pain or anginal symptoms and he is improving hemodynamically  Past Medical History:  Diagnosis Date  . Atrial fibrillation (HCC)   . CAD (coronary artery disease)   . CHF (congestive heart failure) (HCC)   . Diabetes mellitus without complication (HCC)   . Gout   . Hypercholesterolemia   . Hypertension       Surgical History:  Past Surgical History:  Procedure Laterality Date  . CARDIAC CATHETERIZATION    . CARDIOVERSION N/A 01/20/2017   Procedure: CARDIOVERSION;  Surgeon: Marcina MillardAlexander Paraschos, MD;  Location: [facility] ORS;  Service: Cardiovascular;  Laterality: N/A;  . COLONOSCOPY    . CORONARY ARTERY BYPASS GRAFT    .  CORONARY STENT INTERVENTION N/A 03/28/2017   Procedure: Coronary Stent Intervention;  Surgeon: Marcina MillardAlexander Paraschos, MD;  Location: [facility] INVASIVE CV LAB;  Service: Cardiovascular;  Laterality: N/A;  . LEFT HEART CATH AND CORONARY ANGIOGRAPHY N/A 03/28/2017   Procedure: Left Heart Cath and Coronary Angiography;  Surgeon: Marcina MillardAlexander Paraschos, MD;  Location: [facility] INVASIVE CV LAB;  Service: Cardiovascular;  Laterality: N/A;  . limbar back surgery    . PENILE PROSTHESIS IMPLANT       Home Meds: Prior to Admission medications   Medication Sig Start Date End Date Taking? Authorizing Provider  apixaban (ELIQUIS) 5 MG TABS tablet Take 5 mg by mouth every 12 (twelve) hours.    Yes [provider]  aspirin 81 MG EC tablet Take 81 mg by mouth daily.    Yes [provider]  atorvastatin (LIPITOR) 80 MG tablet Take 80 mg by mouth daily.    Yes [provider]  digoxin (LANOXIN) 0.125 MG tablet Take 1 tablet (0.125 mg total) by mouth daily. 04/23/17  Yes Auburn BilberryPatel, Shreyang, MD  furosemide (LASIX) 20 MG tablet Take 20 mg by mouth every other day.    Yes [provider]  gabapentin (NEURONTIN) 100 MG capsule Take 100-300 mg by mouth See admin instructions. Take 1 capsule (100mg ) by mouth at dinnertime and take 3 capsules (300mg ) by mouth at bedtime   Yes [provider]  glimepiride (AMARYL) 4 MG tablet Take 4 mg by mouth daily with breakfast.    Yes [provider]  Insulin Degludec 200 UNIT/ML SOPN Inject 130 Units into the skin daily.  Yes [provider]  irbesartan (AVAPRO) 300 MG tablet Take 300 mg by mouth daily.    Yes [provider]  metFORMIN (GLUCOPHAGE-XR) 500 MG 24 hr tablet Take 1,000 mg by mouth 2 (two) times daily.    Yes [provider]  metoprolol succinate (TOPROL-XL) 25 MG 24 hr tablet Take 12.5 mg by mouth daily.    Yes [provider]  triamcinolone cream (KENALOG) 0.1 % Apply 1 application topically 2  (two) times daily as needed (skin irritations).    Yes [provider]    Inpatient Medications:  . apixaban  5 mg Oral BID  . aspirin  81 mg Oral Daily  . atorvastatin  80 mg Oral q1800  . colchicine  0.6 mg Oral TID  . digoxin  0.125 mg Oral Daily  . furosemide  40 mg Intravenous Q12H  . gabapentin  100 mg Oral TID  . insulin aspart  0-5 Units Subcutaneous QHS  . insulin aspart  0-9 Units Subcutaneous TID WC  . irbesartan  300 mg Oral Daily  . metoprolol succinate  25 mg Oral Daily  . mometasone-formoterol  2 puff Inhalation BID  . sodium chloride flush  3 mL Intravenous Q12H  . tiotropium  18 mcg Inhalation Daily   . sodium chloride      Allergies:  Allergies  Allergen Reactions  . Sitagliptin Rash and Other (See Comments)    Januvia    Social History   Socioeconomic History  . Marital status: Divorced    Spouse name: Not on file  . Number of children: Not on file  . Years of education: Not on file  . Highest education level: Not on file  Occupational History  . Not on file  Social Needs  . Financial resource strain: Not on file  . Food insecurity    Worry: Not on file    Inability: Not on file  . Transportation needs    Medical: Not on file    Non-medical: Not on file  Tobacco Use  . Smoking status: Former Smoker    Packs/day: 0.50    Years: 50.00    Pack years: 25.00    Types: Cigarettes  . Smokeless tobacco: Current User  Substance and Sexual Activity  . Alcohol use: No    Alcohol/week: 0.0 standard drinks  . Drug use: No  . Sexual activity: Not on file  Lifestyle  . Physical activity    Days per week: Not on file    Minutes per session: Not on file  . Stress: Not on file  Relationships  . Social Musicianconnections    Talks on phone: Not on file    Gets together: Not on file    Attends religious service: Not on file    Active member of club or organization: Not on file    Attends meetings of clubs or organizations: Not on file     Relationship status: Not on file  . Intimate partner violence    Fear of current or ex partner: Not on file    Emotionally abused: Not on file    Physically abused: Not on file    Forced sexual activity: Not on file  Other Topics Concern  . Not on file  Social History Narrative  . Not on file     Family History  Problem Relation Age of Onset  . Hypertension Mother      Review of Systems Positive for shortness of breath Negative for: General:  chills, fever, night sweats or weight changes.  Cardiovascular: PND orthopnea syncope dizziness  Dermatological skin lesions rashes Respiratory: Cough congestion Urologic: Frequent urination urination at night and hematuria Abdominal: negative for nausea, vomiting, diarrhea, bright red blood per rectum, melena, or hematemesis Neurologic: negative for visual changes, and/or hearing changes  All other systems reviewed and are otherwise negative except as noted above.  Labs: No results for input(s): CKTOTAL, CKMB, TROPONINI in the last 72 hours. Lab Results  Component Value Date   WBC 10.7 (H) 05/27/2019   HGB 14.6 05/27/2019   HCT 45.2 05/27/2019   MCV 94.6 05/27/2019   PLT 221 05/27/2019    Recent Labs  Lab 05/26/19 1054 05/27/19 0445  NA 140 141  K 4.5 3.7  CL 105 101  CO2 26 29  BUN 29* 31*  CREATININE 1.54* 1.55*  CALCIUM 9.1 9.1  PROT 7.8  --   BILITOT 2.1*  --   ALKPHOS 80  --   ALT 15  --   AST 29  --   GLUCOSE 93 210*   No results found for: CHOL, HDL, LDLCALC, TRIG No results found for: DDIMER  Radiology/Studies:  Dg Chest 1 View  Result Date: 05/26/2019 CLINICAL DATA:  Shortness of breath and leg swelling EXAM: CHEST  1 VIEW COMPARISON:  08/26/2017 is the most recent available comparison FINDINGS: Small right pleural effusion with partially obscured lower lobe. No edema or pneumothorax. Chronic cardiomegaly. 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 IMPRESSION: 1. Small right pleural effusion with  opacified/obscured lower lobe. 2. Cardiomegaly. 3. No pulmonary edema. Electronically Signed   By: Marnee SpringJonathon  Watts M.D.   On: 05/26/2019 11:11    EKG: Atrial fibrillation with bundle branch block  Weights: Filed Weights   05/26/19 1017 05/26/19 1029 05/27/19 0137  Weight: 98 kg 98 kg 92.7 kg     Physical Exam: Blood pressure (!) 151/76, pulse 72, temperature 97.7 F (36.5 C), temperature source Oral, resp. rate 18, height 5\' 10"  (1.778 m), weight 92.7 kg, SpO2 99 %. Body mass index is 29.31 kg/m. General: Well developed, well nourished, in no acute distress. Head eyes ears nose throat: Normocephalic, atraumatic, sclera non-icteric, no xanthomas, nares are without discharge. No apparent thyromegaly and/or mass  Lungs: Normal respiratory effort.  no wheezes, few basilar rales, no rhonchi.  Heart: Regular with normal S1 S2. no murmur gallop, no rub, PMI is normal size and placement, carotid upstroke normal without bruit, jugular venous pressure is normal Abdomen: Soft, non-tender,  -distended with normoactive bowel sounds. No hepatomegaly. No rebound/guarding. No obvious abdominal masses. Abdominal aorta is normal size without bruit Extremities: Trace edema. no cyanosis, no clubbing, no ulcers  Peripheral : 2+ bilateral upper extremity pulses, 2+ bilateral femoral pulses, 2+ bilateral dorsal pedal pulse Neuro: Alert and oriented. No facial asymmetry. No focal deficit. Moves all extremities spontaneously. Musculoskeletal: Normal muscle tone without kyphosis Psych:  Responds to questions appropriately with a normal affect.    Assessment: 73 year old male with chronic kidney disease stage III chronic permanent atrial fibrillation hypertension hyperlipidemia with acute on chronic diastolic dysfunction heart failure without evidence of angina or myocardial infarction  Plan: 1.  Continue intravenous Lasix for pulmonary edema lower extremity edema and acute on chronic diastolic dysfunction help  failure 2.  No further intervention of cardiovascular disease currently without evidence of myocardial infarction or angina 3.  Consider echocardiogram for further evaluation and treatment options of LV dysfunction or heart failure 4.  Continue anticoagulation for further  risk reduction in stroke with atrial fibrillation 5.  Continue metoprolol for heart failure as well as heart rate control of atrial fibrillation 6.  Begin ambulation and follow-up for improvements of symptoms and possible discharged home tomorrow if significantly improved  Signed, Corey Skains M.D. [facility] 05/27/2019, 3:11 PM

## 2019-05-27 NOTE — Progress Notes (Signed)
[place] [facility]                                                                                                                                                                                  Patient Demographics   Jeremiah Chapman, is a 73 y.o. male, DOB - 01-Sep-1946, SJG:[id number]  Admit date - 05/26/2019   Admitting Physician Demetrios Loll, MD  Outpatient Primary MD for the patient is Juluis Pitch, MD   LOS - 1  Subjective: Ended with shortness of breath shortness of breath improved he has diuresed well with IV Lasix    Review of Systems:   CONSTITUTIONAL: No documented fever. No fatigue, weakness. No weight gain, no weight loss.  EYES: No blurry or double vision.  ENT: No tinnitus. No postnasal drip. No redness of the oropharynx.  RESPIRATORY: No cough, no wheeze, no hemoptysis.  Positive dyspnea.  CARDIOVASCULAR: No chest pain. No orthopnea. No palpitations. No syncope.  GASTROINTESTINAL: No nausea, no vomiting or diarrhea. No abdominal pain. No melena or hematochezia.  GENITOURINARY: No dysuria or hematuria.  ENDOCRINE: No polyuria or nocturia. No heat or cold intolerance.  HEMATOLOGY: No anemia. No bruising. No bleeding.  INTEGUMENTARY: No rashes. No lesions.  MUSCULOSKELETAL: No arthritis. No swelling. No gout.  NEUROLOGIC: No numbness, tingling, or ataxia. No seizure-type activity.  PSYCHIATRIC: No anxiety. No insomnia. No ADD.    Vitals:   Vitals:   05/26/19 1924 05/27/19 0137 05/27/19 0514 05/27/19 0828  BP: (!) 144/73  (!) 141/73 (!) 151/76  Pulse: 65  70 72  Resp: 18   18  Temp: 98 F (36.7 C)  97.9 F (36.6 C) 97.7 F (36.5 C)  TempSrc: Oral  Oral Oral  SpO2: 98%  97% 99%  Weight:  92.7 kg    Height:        Wt Readings from Last 3 Encounters:  05/27/19 92.7 kg  01/14/18 93.9 kg  04/21/17 94.2 kg     Intake/Output Summary (Last 24 hours) at 05/27/2019 1536 Last data filed at 05/27/2019 0800 Gross per 24 hour   Intake 723 ml  Output 3050 ml  Net -2327 ml    Physical Exam:   GENERAL: Pleasant-appearing in no apparent distress.  HEAD, EYES, EARS, NOSE AND THROAT: Atraumatic, normocephalic. Extraocular muscles are intact. Pupils equal and reactive to light. Sclerae anicteric. No conjunctival injection. No oro-pharyngeal erythema.  NECK: Supple. There is no jugular venous distention. No bruits, no lymphadenopathy, no thyromegaly.  HEART: Regular rate and rhythm,. No murmurs, no rubs, no clicks.  LUNGS: Crackles at the bases ABDOMEN: Soft, flat, nontender, nondistended. Has good bowel sounds. No hepatosplenomegaly appreciated.  EXTREMITIES: No evidence of any cyanosis, clubbing, or peripheral edema.  +2 pedal and radial pulses bilaterally.  NEUROLOGIC: The patient is alert, awake, and oriented x3 with no focal motor or sensory deficits appreciated bilaterally.  SKIN: Moist and warm with no rashes appreciated.  Psych: Not anxious, depressed LN: No inguinal LN enlargement    Antibiotics   Anti-infectives (From admission, onward)   None      Medications   Scheduled Meds: . apixaban  5 mg Oral BID  . aspirin  81 mg Oral Daily  . atorvastatin  80 mg Oral q1800  . colchicine  0.6 mg Oral TID  . digoxin  0.125 mg Oral Daily  . furosemide  40 mg Intravenous Q12H  . gabapentin  100 mg Oral TID  . insulin aspart  0-5 Units Subcutaneous QHS  . insulin aspart  0-9 Units Subcutaneous TID WC  . irbesartan  300 mg Oral Daily  . metoprolol succinate  25 mg Oral Daily  . mometasone-formoterol  2 puff Inhalation BID  . sodium chloride flush  3 mL Intravenous Q12H  . tiotropium  18 mcg Inhalation Daily   Continuous Infusions: . sodium chloride     PRN Meds:.sodium chloride, acetaminophen **OR** acetaminophen, albuterol, bisacodyl, HYDROcodone-acetaminophen, ondansetron **OR** ondansetron (ZOFRAN) IV, senna-docusate, sodium chloride flush   Data Review:   Micro Results Recent Results (from  the past 240 hour(s))  SARS Coronavirus 2 (CEPHEID- Performed in [facility] lab), Hosp Order     Status: None   Collection Time: 05/26/19 11:42 AM   Specimen: Nasopharyngeal Swab  Result Value Ref Range Status   SARS Coronavirus 2 NEGATIVE NEGATIVE Final    Comment: (NOTE) If result is NEGATIVE SARS-CoV-2 target nucleic acids are NOT DETECTED. The SARS-CoV-2 RNA is generally detectable in upper and lower  respiratory specimens during the acute phase of infection. The lowest  concentration of SARS-CoV-2 viral copies this assay can detect is 250  copies / mL. A negative result does not preclude SARS-CoV-2 infection  and should not be used as the sole basis for treatment or other  patient management decisions.  A negative result may occur with  improper specimen collection / handling, submission of specimen other  than nasopharyngeal swab, presence of viral mutation(s) within the  areas targeted by this assay, and inadequate number of viral copies  (<250 copies / mL). A negative result must be combined with clinical  observations, patient history, and epidemiological information. If result is POSITIVE SARS-CoV-2 target nucleic acids are DETECTED. The SARS-CoV-2 RNA is generally detectable in upper and lower  respiratory specimens dur ing the acute phase of infection.  Positive  results are indicative of active infection with SARS-CoV-2.  Clinical  correlation with patient history and other diagnostic information is  necessary to determine patient infection status.  Positive results do  not rule out bacterial infection or co-infection with other viruses. If result is PRESUMPTIVE POSTIVE SARS-CoV-2 nucleic acids MAY BE PRESENT.   A presumptive positive result was obtained on the submitted specimen  and confirmed on repeat testing.  While 2019 novel coronavirus  (SARS-CoV-2) nucleic acids may be present in the submitted sample  additional confirmatory testing may be necessary  for epidemiological  and / or clinical management purposes  to differentiate between  SARS-CoV-2 and other Sarbecovirus currently known to infect humans.  If clinically indicated additional testing with an alternate test  methodology [phone] is advised. The SARS-CoV-2 RNA is generally  detectable in upper and lower  respiratory sp ecimens during the acute  phase of infection. The expected result is Negative. Fact Sheet for Patients:  BoilerBrush.com.cyhttps://www.fda.gov/media/136312/download Fact Sheet for Healthcare Providers: https://pope.com/https://www.fda.gov/media/136313/download This test is not yet approved or cleared by the [place] FDA and has been authorized for detection and/or diagnosis of SARS-CoV-2 by FDA under an Emergency Use Authorization (EUA).  This EUA will remain in effect (meaning this test can be used) for the duration of the COVID-19 declaration under Section 564(b)(1) of the Act, 21 U.S.C. section 360bbb-3(b)(1), unless the authorization is terminated or revoked sooner. Performed at [facility], [address] [place], [place] [address]     Radiology Reports Dg Chest 1 View  Result Date: 05/26/2019 CLINICAL DATA:  Shortness of breath and leg swelling EXAM: CHEST  1 VIEW COMPARISON:  08/26/2017 is the most recent available comparison FINDINGS: Small right pleural effusion with partially obscured lower lobe. No edema or pneumothorax. Chronic cardiomegaly. 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 IMPRESSION: 1. Small right pleural effusion with opacified/obscured lower lobe. 2. Cardiomegaly. 3. No pulmonary edema. Electronically Signed   By: Marnee SpringJonathon  Watts M.D.   On: 05/26/2019 11:11     CBC Recent Labs  Lab 05/26/19 1054 05/27/19 0445  WBC 9.8 10.7*  HGB 14.0 14.6  HCT 42.1 45.2  PLT 217 221  MCV 94.4 94.6  MCH 31.4 30.5  MCHC 33.3 32.3  RDW 14.2 14.1  LYMPHSABS 1.4  --   MONOABS 0.7  --   EOSABS 0.2  --   BASOSABS 0.1  --     Chemistries  Recent  Labs  Lab 05/26/19 1054 05/27/19 0445  NA 140 141  K 4.5 3.7  CL 105 101  CO2 26 29  GLUCOSE 93 210*  BUN 29* 31*  CREATININE 1.54* 1.55*  CALCIUM 9.1 9.1  AST 29  --   ALT 15  --   ALKPHOS 80  --   BILITOT 2.1*  --    ------------------------------------------------------------------------------------------------------------------ estimated creatinine clearance is 47.8 mL/min (A) (by C-G formula based on SCr of 1.55 mg/dL (H)). ------------------------------------------------------------------------------------------------------------------ No results for input(s): HGBA1C in the last 72 hours. ------------------------------------------------------------------------------------------------------------------ No results for input(s): CHOL, HDL, LDLCALC, TRIG, CHOLHDL, LDLDIRECT in the last 72 hours. ------------------------------------------------------------------------------------------------------------------ No results for input(s): TSH, T4TOTAL, T3FREE, THYROIDAB in the last 72 hours.  Invalid input(s): FREET3 ------------------------------------------------------------------------------------------------------------------ No results for input(s): VITAMINB12, FOLATE, FERRITIN, TIBC, IRON, RETICCTPCT in the last 72 hours.  Coagulation profile No results for input(s): INR, PROTIME in the last 168 hours.  No results for input(s): DDIMER in the last 72 hours.  Cardiac Enzymes No results for input(s): CKMB, TROPONINI, MYOGLOBIN in the last 168 hours.  Invalid input(s): CK ------------------------------------------------------------------------------------------------------------------ Invalid input(s): POCBNP    Assessment & Plan  Patient 73 year old presenting with shortness of breath    1.  Acute respiratory failure with hypoxia due to acute on chronic diastolic CHF Continue IV Lasix twice daily echocardiogram of the heart pending cardiology to follow  2.  Right  pleural effusion.    Continue to monitor this is due to CHF  3.Chronic A. fib.  Rate controlled, continue Eliquis and digoxin.  4. Hypertension.  Continue home hypertension medication.  5. Diabetes.    Continue sliding scale blood sugar stable  6.  CKD stage III.  Stable.     Code Status Orders  (From admission, onward)         Start     Ordered   05/26/19 1534  Do not  attempt resuscitation (DNR)  Continuous    Question Answer Comment  In the event of cardiac or respiratory ARREST Do not call a "code blue"   In the event of cardiac or respiratory ARREST Do not perform Intubation, CPR, defibrillation or ACLS   In the event of cardiac or respiratory ARREST Use medication by any route, position, wound care, and other measures to relive pain and suffering. May use oxygen, suction and manual treatment of airway obstruction as needed for comfort.      05/26/19 1534        Code Status History    Date Active Date Inactive Code Status Order ID Comments User Context   04/21/2017 1046 04/23/2017 1505 Full Code [id number]  Auburn BilberryPatel, Kenzly Rogoff, MD Inpatient   03/26/2017 1319 03/29/2017 1925 Full Code [id number]  Marguarite ArbourSparks, Jeffrey D, MD Inpatient   Advance Care Planning Activity           Consults cardiology   DVT Prophylaxis Eliquis Lab Results  Component Value Date   PLT 221 05/27/2019     Time Spent in minutes 35 minutes  Greater than 50% of time spent in care coordination and counseling patient regarding the condition and plan of care.   Auburn BilberryShreyang Mavric Cortright M.D on 05/27/2019 at 3:36 PM  Between 7am to 6pm - Pager - [phone]  After 6pm go to [email or web address] - Social research officer, governmentpassword [email or web address]  [employer]   Office  [phone]

## 2019-05-27 NOTE — Plan of Care (Signed)
  Problem: Clinical Measurements: Goal: Respiratory complications will improve Outcome: Progressing Note: On room air   Problem: Activity: Goal: Risk for activity intolerance will decrease Outcome: Progressing Note: Steady gait, independent in room    Problem: Nutrition: Goal: Adequate nutrition will be maintained Outcome: Progressing   Problem: Coping: Goal: Level of anxiety will decrease Outcome: Progressing   Problem: Elimination: Goal: Will not experience complications related to urinary retention Outcome: Progressing   Problem: Pain Managment: Goal: General experience of comfort will improve Outcome: Progressing Note: No complaints of pain   Problem: Safety: Goal: Ability to remain free from injury will improve Outcome: Progressing   Problem: Skin Integrity: Goal: Risk for impaired skin integrity will decrease Outcome: Progressing   Problem: Cardiac: Goal: Ability to achieve and maintain adequate cardiopulmonary perfusion will improve Outcome: Progressing Note: Receiving IV lasix, with good urine output

## 2019-05-28 LAB — BASIC METABOLIC PANEL
Anion gap: 12 (ref 5–15)
BUN: 43 mg/dL — ABNORMAL HIGH (ref 8–23)
CO2: 26 mmol/L (ref 22–32)
Calcium: 9.2 mg/dL (ref 8.9–10.3)
Chloride: 100 mmol/L (ref 98–111)
Creatinine, Ser: 1.62 mg/dL — ABNORMAL HIGH (ref 0.61–1.24)
GFR calc Af Amer: 48 mL/min — ABNORMAL LOW (ref >60)
GFR calc non Af Amer: 41 mL/min — ABNORMAL LOW (ref >60)
Glucose, Bld: 92 mg/dL (ref 70–99)
Potassium: 3.5 mmol/L (ref 3.5–5.1)
Sodium: 138 mmol/L (ref 135–145)

## 2019-05-28 LAB — GLUCOSE, CAPILLARY
Glucose-Capillary: 71 mg/dL (ref 70–99)
Glucose-Capillary: 255 mg/dL — ABNORMAL HIGH (ref 70–99)

## 2019-05-28 MED ORDER — COLCHICINE 0.6 MG PO TABS: 0.6000 mg | ORAL_TABLET | Freq: Every day | ORAL | Status: DC

## 2019-05-28 MED ORDER — FUROSEMIDE 20 MG PO TABS: 20.0000 mg | ORAL_TABLET | Freq: Two times a day (BID) | ORAL | 0 refills | Status: DC

## 2019-05-28 NOTE — Discharge Summary (Signed)
[facility] - [place] [facility] [place], 73 y.o., DOB 05-04-1946, MRN [id number]. Admission date: 05/26/2019 Discharge Date 05/28/2019 Primary MD Juluis Pitch, MD Admitting Physician Demetrios Loll, MD  Admission Diagnosis  Shortness of breath [R06.02] Pleural effusion [J90] Volume overload state of heart [E87.79]  Discharge Diagnosis   Active Problems:   Acute on chronic diastolic CHF (congestive heart failure) ([place]) Right-sided pleural effusion Chronic atrial fibrillation Hypertension Diabetes type 2 Chronic kidney disease stage III    Hospital Course Jeremiah Chapman  is a 73 y.o. male with a known history of A. fib, CAD, CHF, hypertension, diabetes, hyperlipidemia and gout.    Patient presented with shortness of breath.  He was noted to have acute on chronic diastolic CHF.  Patient was diuresed with significant improvement in symptoms.  He is feeling much better and stable for discharge home.     I have referred patient to outpatient CHF clinic      Consults  cardiology  Significant Tests:  See full reports for all details     Dg Chest 1 View  Result Date: 05/26/2019 CLINICAL DATA:  Shortness of breath and leg swelling EXAM: CHEST  1 VIEW COMPARISON:  08/26/2017 is the most recent available comparison FINDINGS: Small right pleural effusion with partially obscured lower lobe. No edema or pneumothorax. Chronic cardiomegaly. 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 IMPRESSION: 1. Small right pleural effusion with opacified/obscured lower lobe. 2. Cardiomegaly. 3. No pulmonary edema. Electronically Signed   By: Monte Fantasia M.D.   On: 05/26/2019 11:11       Today   Subjective:   Jeremiah Chapman patient states that he is feeling better  Objective:   Blood pressure 127/72, pulse 60, temperature 98.4 F (36.9 C), temperature source Oral, resp. rate 18, height 5\' 10"  (1.778 m), weight 91.6 kg, SpO2 97 %.  .  Intake/Output Summary  (Last 24 hours) at 05/28/2019 1414 Last data filed at 05/28/2019 1022 Gross per 24 hour  Intake 243 ml  Output 1800 ml  Net -1557 ml    Exam VITAL SIGNS: Blood pressure 127/72, pulse 60, temperature 98.4 F (36.9 C), temperature source Oral, resp. rate 18, height 5\' 10"  (1.778 m), weight 91.6 kg, SpO2 97 %.  GENERAL:  73 y.o.-year-old patient lying in the bed with no acute distress.  EYES: Pupils equal, round, reactive to light and accommodation. No scleral icterus. Extraocular muscles intact.  HEENT: Head atraumatic, normocephalic. Oropharynx and nasopharynx clear.  NECK:  Supple, no jugular venous distention. No thyroid enlargement, no tenderness.  LUNGS: Normal breath sounds bilaterally, no wheezing, rales,rhonchi or crepitation. No use of accessory muscles of respiration.  CARDIOVASCULAR: S1, S2 normal. No murmurs, rubs, or gallops.  ABDOMEN: Soft, nontender, nondistended. Bowel sounds present. No organomegaly or mass.  EXTREMITIES: No pedal edema, cyanosis, or clubbing.  NEUROLOGIC: Cranial nerves II through XII are intact. Muscle strength 5/5 in all extremities. Sensation intact. Gait not checked.  PSYCHIATRIC: The patient is alert and oriented x 3.  SKIN: No obvious rash, lesion, or ulcer.   Data Review     CBC w Diff:  Lab Results  Component Value Date   WBC 10.7 (H) 05/27/2019   HGB 14.6 05/27/2019   HGB 12.1 (L) 05/18/2017   HCT 45.2 05/27/2019   HCT 36.6 (L) 05/18/2017   PLT 221 05/27/2019   PLT 239 01/06/2014   LYMPHOPCT 15 05/26/2019   MONOPCT 7 05/26/2019   EOSPCT 2 05/26/2019  BASOPCT 1 05/26/2019   CMP:  Lab Results  Component Value Date   NA 138 05/28/2019   NA 135 (L) 01/06/2014   K 3.5 05/28/2019   K 3.6 01/06/2014   CL 100 05/28/2019   CL 101 01/06/2014   CO2 26 05/28/2019   CO2 28 01/06/2014   BUN 43 (H) 05/28/2019   BUN 16 01/06/2014   CREATININE 1.62 (H) 05/28/2019   CREATININE 1.57 (H) 01/06/2014   PROT 7.8 05/26/2019   ALBUMIN 3.8  05/26/2019   BILITOT 2.1 (H) 05/26/2019   ALKPHOS 80 05/26/2019   AST 29 05/26/2019   ALT 15 05/26/2019  .  Micro Results Recent Results (from the past 240 hour(s))  SARS Coronavirus 2 (CEPHEID- Performed in [facility] lab), Hosp Order     Status: None   Collection Time: 05/26/19 11:42 AM   Specimen: Nasopharyngeal Swab  Result Value Ref Range Status   SARS Coronavirus 2 NEGATIVE NEGATIVE Final    Comment: (NOTE) If result is NEGATIVE SARS-CoV-2 target nucleic acids are NOT DETECTED. The SARS-CoV-2 RNA is generally detectable in upper and lower  respiratory specimens during the acute phase of infection. The lowest  concentration of SARS-CoV-2 viral copies this assay can detect is 250  copies / mL. A negative result does not preclude SARS-CoV-2 infection  and should not be used as the sole basis for treatment or other  patient management decisions.  A negative result may occur with  improper specimen collection / handling, submission of specimen other  than nasopharyngeal swab, presence of viral mutation(s) within the  areas targeted by this assay, and inadequate number of viral copies  (<250 copies / mL). A negative result must be combined with clinical  observations, patient history, and epidemiological information. If result is POSITIVE SARS-CoV-2 target nucleic acids are DETECTED. The SARS-CoV-2 RNA is generally detectable in upper and lower  respiratory specimens dur ing the acute phase of infection.  Positive  results are indicative of active infection with SARS-CoV-2.  Clinical  correlation with patient history and other diagnostic information is  necessary to determine patient infection status.  Positive results do  not rule out bacterial infection or co-infection with other viruses. If result is PRESUMPTIVE POSTIVE SARS-CoV-2 nucleic acids MAY BE PRESENT.   A presumptive positive result was obtained on the submitted specimen  and confirmed on repeat testing.   While 2019 novel coronavirus  (SARS-CoV-2) nucleic acids may be present in the submitted sample  additional confirmatory testing may be necessary for epidemiological  and / or clinical management purposes  to differentiate between  SARS-CoV-2 and other Sarbecovirus currently known to infect humans.  If clinically indicated additional testing with an alternate test  methodology [phone]([id number]) is advised. The SARS-CoV-2 RNA is generally  detectable in upper and lower respiratory sp ecimens during the acute  phase of infection. The expected result is Negative. Fact Sheet for Patients:  BoilerBrush.com.cyhttps://www.fda.gov/media/136312/download Fact Sheet for Healthcare Providers: https://pope.com/https://www.fda.gov/media/136313/download This test is not yet approved or cleared by the [place] FDA and has been authorized for detection and/or diagnosis of SARS-CoV-2 by FDA under an Emergency Use Authorization (EUA).  This EUA will remain in effect (meaning this test can be used) for the duration of the COVID-19 declaration under Section 564(b)(1) of the Act, 21 U.S.C. section 360bbb-3(b)(1), unless the authorization is terminated or revoked sooner. Performed at [facility], [address] [place], [place] [address]         Code Status Orders  (  From admission, onward)         Start     Ordered   05/26/19 1534  Do not attempt resuscitation (DNR)  Continuous    Question Answer Comment  In the event of cardiac or respiratory ARREST Do not call a "code blue"   In the event of cardiac or respiratory ARREST Do not perform Intubation, CPR, defibrillation or ACLS   In the event of cardiac or respiratory ARREST Use medication by any route, position, wound care, and other measures to relive pain and suffering. May use oxygen, suction and manual treatment of airway obstruction as needed for comfort.      05/26/19 1534        Code Status History    Date Active Date Inactive Code Status Order ID Comments User  Context   04/21/2017 1046 04/23/2017 1505 Full Code [id number]  Auburn BilberryPatel, Stevie Ertle, MD Inpatient   03/26/2017 1319 03/29/2017 1925 Full Code [id number]  Marguarite ArbourSparks, Jeffrey D, MD Inpatient   Advance Care Planning Activity          Follow-up Information    [facility] Follow up on 06/21/2019.   Specialty: Cardiology Why: at 12:00pm Contact information: [address] [place] [id number] [phone]       [name], David, MD In 6 days.   Specialty: Family Medicine Why: Patient needs to schedule appointment  Contact information: [address] [place] [address] [phone]        Marcina MillardParaschos, Alexander, MD. Go on 06/04/2019.   Specialty: Cardiology Why: hosp /fu..............Marland Kitchen.appointment at9:45am Contact information: [address] [facility] [place] [address] [phone]           Discharge Medications   Allergies as of 05/28/2019      Reactions   Sitagliptin Rash, Other (See Comments)   Januvia      Medication List    TAKE these medications   apixaban 5 MG Tabs tablet Commonly known as: ELIQUIS Take 5 mg by mouth every 12 (twelve) hours. Notes to patient: Take this tonight, you had your other dose this morning in the hospital   aspirin 81 MG EC tablet Take 81 mg by mouth daily. Notes to patient: Your next dose is tomorrow morning    atorvastatin 80 MG tablet Commonly known as: LIPITOR Take 80 mg by mouth daily. Notes to patient: Take this tonight at supper/dinner   digoxin 0.125 MG tablet Commonly known as: LANOXIN Take 1 tablet (0.125 mg total) by mouth daily. Notes to patient: Your next dose is tomorrow morning.   furosemide 20 MG tablet Commonly known as: LASIX Take 1 tablet (20 mg total) by mouth 2 (two) times daily. What changed: when to take this Notes to patient: Take this tonight around supper/dinner time. You had the morning dose in the  hospital   gabapentin 100 MG capsule Commonly known as: NEURONTIN Take 100-300 mg by mouth See admin instructions. Take 1 capsule (100mg ) by mouth at dinnertime and take 3 capsules (300mg ) by mouth at bedtime Notes to patient: Take this at dinner/supper tonight   glimepiride 4 MG tablet Commonly known as: AMARYL Take 4 mg by mouth daily with breakfast. Notes to patient: Take this tomrorow    Insulin Degludec 200 UNIT/ML Sopn Inject 130 Units into the skin daily.   irbesartan 300 MG tablet Commonly known as: AVAPRO Take 300 mg by mouth daily. Notes to patient: Take this tomorrow morning, you had todays  dose in the hospital   metFORMIN 500 MG 24 hr tablet Commonly known as: GLUCOPHAGE-XR Take 1,000 mg by mouth 2 (two) times daily. Notes to patient: Take tonight   metoprolol succinate 25 MG 24 hr tablet Commonly known as: TOPROL-XL Take 12.5 mg by mouth daily. Notes to patient: Take this tomorrow you had todays dose in the hospital   triamcinolone cream 0.1 % Commonly known as: KENALOG Apply 1 application topically 2 (two) times daily as needed (skin irritations).          Total Time in preparing paper work, data evaluation and todays exam - 35 minutes  Auburn BilberryShreyang Rylei Codispoti M.D on 05/28/2019 at 2:14 PM [facility]   Office  [phone]

## 2019-05-28 NOTE — Progress Notes (Signed)
[facility]  Patient: Jeremiah MoralesKenneth D Chapman / Admit Date: 05/26/2019 / Date of Encounter: 05/28/2019, 9:04 AM   Subjective: Patient significantly improved since admission with less shortness of breath and no evidence of chest discomfort or significant rhythm disturbances.  Patient is hemodynamically stable  Review of Systems: Positive for: Mild shortness of breath Negative for: Vision change, hearing change, syncope, dizziness, nausea, vomiting,diarrhea, bloody stool, stomach pain, cough, congestion, diaphoresis, urinary frequency, urinary pain,skin lesions, skin rashes Others previously listed  Objective: Telemetry: Atrial fibrillation Physical Exam: Blood pressure 127/72, pulse 60, temperature 98.4 F (36.9 C), temperature source Oral, resp. rate 18, height 5\' 10"  (1.778 m), weight 91.6 kg, SpO2 97 %. Body mass index is 28.97 kg/m. General: Well developed, well nourished, in no acute distress. Head: Normocephalic, atraumatic, sclera non-icteric, no xanthomas, nares are without discharge. Neck: No apparent masses Lungs: Normal respirations with no wheezes, no rhonchi, no rales , few crackles   Heart: Irregular rate and rhythm, normal S1 S2, no murmur, no rub, no gallop, PMI is normal size and placement, carotid upstroke normal without bruit, jugular venous pressure normal Abdomen: Soft, non-tender, non-distended with normoactive bowel sounds. No hepatosplenomegaly. Abdominal aorta is normal size without bruit Extremities: Trace edema, no clubbing, no cyanosis, no ulcers,  Peripheral: 2+ radial, 2+ femoral, 2+ dorsal pedal pulses Neuro: Alert and oriented. Moves all extremities spontaneously. Psych:  Responds to questions appropriately with a normal affect.   Intake/Output Summary (Last 24 hours) at 05/28/2019 0904 Last data filed at 05/28/2019 0755 Gross per 24 hour  Intake 3 ml  Output 2800 ml  Net -2797 ml    Inpatient Medications:  . apixaban   5 mg Oral BID  . aspirin  81 mg Oral Daily  . atorvastatin  80 mg Oral q1800  . colchicine  0.6 mg Oral TID  . digoxin  0.125 mg Oral Daily  . furosemide  40 mg Intravenous Q12H  . gabapentin  100 mg Oral TID  . insulin aspart  0-5 Units Subcutaneous QHS  . insulin aspart  0-9 Units Subcutaneous TID WC  . irbesartan  300 mg Oral Daily  . metoprolol succinate  25 mg Oral Daily  . mometasone-formoterol  2 puff Inhalation BID  . sodium chloride flush  3 mL Intravenous Q12H  . tiotropium  18 mcg Inhalation Daily   Infusions:  . sodium chloride      Labs: Recent Labs    05/27/19 0445 05/28/19 0553  NA 141 138  K 3.7 3.5  CL 101 100  CO2 29 26  GLUCOSE 210* 92  BUN 31* 43*  CREATININE 1.55* 1.62*  CALCIUM 9.1 9.2   Recent Labs    05/26/19 1054  AST 29  ALT 15  ALKPHOS 80  BILITOT 2.1*  PROT 7.8  ALBUMIN 3.8   Recent Labs    05/26/19 1054 05/27/19 0445  WBC 9.8 10.7*  NEUTROABS 7.4  --   HGB 14.0 14.6  HCT 42.1 45.2  MCV 94.4 94.6  PLT 217 221   No results for input(s): CKTOTAL, CKMB, TROPONINI in the last 72 hours. Invalid input(s): POCBNP No results for input(s): HGBA1C in the last 72 hours.   Weights: Filed Weights   05/26/19 1029 05/27/19 0137 05/28/19 0316  Weight: 98 kg 92.7 kg 91.6 kg     Radiology/Studies:  Dg Chest 1 View  Result Date: 05/26/2019 CLINICAL DATA:  Shortness of breath and leg swelling EXAM: CHEST  1 VIEW COMPARISON:  08/26/2017 is the most recent available comparison FINDINGS: Small right pleural effusion with partially obscured lower lobe. No edema or pneumothorax. Chronic cardiomegaly. 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 IMPRESSION: 1. Small right pleural effusion with opacified/obscured lower lobe. 2. Cardiomegaly. 3. No pulmonary edema. Electronically Signed   By: Monte Fantasia M.D.   On: 05/26/2019 11:11     Assessment and Recommendation  73 y.o. male with known chronic nonvalvular atrial fibrillation chronic  kidney disease stage III coronary atherosclerosis hypertension hyperlipidemia with acute on chronic diastolic dysfunction congestive heart failure and pulmonary edema slowly improving with intravenous Lasix and no current evidence of myocardial infarction 1.  No change in current medical regimen for heart rate control of atrial fibrillation including digoxin 2.  No further intervention or diagnostic testing necessary at this time for acute on chronic systolic dysfunction heart failure improved with intravenous Lasix 3.  Continue anticoagulation for further risk reduction of stroke with atrial fibrillation without change 4.  Begin ambulation and follow for improvements of symptoms.  If no further significant issues with adjustments of medication okay for discharge home from cardiac standpoint with follow-up next week  Signed, Serafina Royals M.D. FACC

## 2019-05-28 NOTE — Plan of Care (Signed)
  Problem: Clinical Measurements: Goal: Ability to maintain clinical measurements within normal limits will improve Outcome: Progressing Goal: Respiratory complications will improve Outcome: Progressing Goal: Cardiovascular complication will be avoided Outcome: Progressing   Problem: Education: Goal: Ability to demonstrate management of disease process will improve Outcome: Adequate for Discharge

## 2019-05-28 NOTE — Progress Notes (Signed)
Patient discharged home this afternoon, via son. Pt lives alone, and has no concerns or needs at this time. He has been educated for the management of his heart failure, and his medication, prescriptions and follow up appointments.

## 2019-05-28 NOTE — Progress Notes (Signed)
Cardiac Rehab Navigator/ Exercise Physiologist Note  This EP rounded on patient. Patient was sitting on EOB. Patient is feeling ready to go home. Patient reports leg swelling being gone and returned to baseline. Patient reports having some shortness of breath.  CHF Education:??  Educational session with patient completed.  Provided patient with "Living Better with Heart Failure" packet. Briefly reviewed definition of heart failure and signs and symptoms of an exacerbation.This EP discussed potential causes of CHF.  Explained to patient that HF is a chronic illness which requires self-assessment / self-management along with help from the cardiologist/PCP. This EP discussed definition of EF measurement along with normal value compared to patient's EF 40%.?  *Reviewed importance of and reason behind checking weight daily in the AM, after using the bathroom, but before getting dressed. Patient has not previously been informed to weigh daily. Patient agreed to start weighing and recording daily. Patient understands why weighing is important.  *Reviewed with patient the following information:  *Discussed when to call the Dr= weight gain of >2-3lb overnight of 5lb in a week,  *Discussed yellow zone= call MD: weight gain of >2-3lb overnight of 5lb in a week, increased swelling, increased SOB when lying down, chest discomfort, dizziness, increased fatigue  *Red Zone= call 911: struggle to breath, fainting or near fainting, significant chest pain ?  *Heart Failure Zone Magnet given and reviewed with patient.  ? *Diet - Patient currently ordered heart healthy. Instructed patient to follow a low sodium diet of 2000 mg or less.  Recommended foods for low sodium heart healthy nutrition or heart failure carb modified nutrition therapy discussed. Reviewed with patient steps to reading a food label with close attention to serving size and mg of sodium. Patient reports that salting his foods is a big part of his  problem. Patient plans to throw his salt shaker away when he gets home. This EP encouraged patient to buy Mrs. Dash to season his foods. Patient was thankful for an alternative to try.  *Discussed fluid intake with patient as well. Patient not currently on a fluid restriction, but advised no more than 64 ounces of fluid per day. Demonstrated this volume to patient using the bedside water pitcher. Patient verbalized that he does not drink more than 64 oz.  *Instructed patient to take medications as prescribed for heart failure. Explained briefly why patient is on the medications  and discussed monitoring and side effects.?  *Discussed exercise / activity. Patient has not been active for awhile due to his health. Patient reports that he has participated in a Rehab program before but is not interested in participating currently. Patient likes to walk in his neighborhood when he is not as short of breath.  Encouraged patient to be as active as possible.?  *Smoking Cessation- Patient is a FORMER smoker.?  *[facility] - Explained the purpose of the HF Clinic. Explained to patient the HF Clinic does not replace PCP nor Cardiologist, but is an additional resource to helping patient manage heart failure at home. Patient does not wish to be followed in the [facility] HF Clinic. Patient has an appointment scheduled for 06/21/19 at 12pm.   Again, the 5 Steps to Living Better with Heart Failure were reviewed with patient.  Patient thanked me for providing the above information. ?  Jasper Loser, [facility]  Exercise Physiologist Department Phone #: [phone] Fax: [phone]  Direct Line [phone] Email Address: [email or web address]

## 2019-05-28 NOTE — Plan of Care (Signed)
  Problem: Clinical Measurements: Goal: Ability to maintain clinical measurements within normal limits will improve Outcome: Progressing Goal: Respiratory complications will improve Outcome: Progressing Goal: Cardiovascular complication will be avoided Outcome: Progressing   

## 2019-05-28 NOTE — Plan of Care (Signed)
  Problem: Clinical Measurements: Goal: Respiratory complications will improve Outcome: Progressing Note: Room air   Problem: Activity: Goal: Risk for activity intolerance will decrease Outcome: Progressing Note: Up independently in room, steady gait   Problem: Coping: Goal: Level of anxiety will decrease Outcome: Progressing   Problem: Elimination: Goal: Will not experience complications related to urinary retention Outcome: Progressing   Problem: Pain Managment: Goal: General experience of comfort will improve Outcome: Progressing Note: No complaints of pain   Problem: Safety: Goal: Ability to remain free from injury will improve Outcome: Progressing   Problem: Cardiac: Goal: Ability to achieve and maintain adequate cardiopulmonary perfusion will improve Outcome: Progressing Note: Receiving IV lasix with good output   Problem: Education: Goal: Knowledge of General Education information will improve Description: Including pain rating scale, medication(s)/side effects and non-pharmacologic comfort measures Outcome: Completed/Met   Problem: Nutrition: Goal: Adequate nutrition will be maintained Outcome: Completed/Met

## 2019-06-20 NOTE — Progress Notes (Deleted)
   Patient ID: Jeremiah Chapman, male    DOB: 1946-02-26, 73 y.o.   MRN: [id number]  HPI  Mr Boettner is a 73 y/o male with a history of  Echo report from 05/07/2019 reviewed and showed an EF of 40% along with moderate AR and mild/moderate PR/TR.   Catheterization done 03/28/17 and revealed:  Ost 1st Mrg to 1st Mrg lesion, 90 %stenosed.  Prox Cx to Mid Cx lesion, 30 %stenosed.  Ost LAD to Prox LAD lesion, 100 %stenosed.  Prox RCA to Mid RCA lesion, 50 %stenosed.  Ost 2nd Mrg to 2nd Mrg lesion, 100 %stenosed.  SVG and is moderate in size.  LIMA and is moderate in size.  Dist LAD lesion, 50 %stenosed.  A STENT RESOLUTE ONYX [id number] drug eluting stent was successfully placed, and does not overlap previously placed stent.  Mid Cx lesion, 95 %stenosed.  Post intervention, there is a 0% residual stenosis.  1. Two-vessel coronary artery disease with occluded proximal LAD, 95% stenosis mid segment of large dominant left circumflex, and chronically appearing 90% stenosis ostium OM1 with apparent bridging collaterals 2. Moderately reduced left ventricular function, with LVEF of 30-35% 3. Successful PCI, DES mid left circumflex  Admitted 05/26/2019 due to acute on chronic HF. Cardiology consult obtained. Initially given IV lasix and then transitioned to oral diuretics. Discharged after 2 days.   He presents today for his initial visit with a chief complaint of   Review of Systems    Physical Exam    Assessment & Plan:  1: Chronic heart failure with mildly reduced ejection fraction- - NYHA class - saw pulmonology Raul Del) 04/11/2019 - saw cardiology ([place]) 02/27/2019  2: HTN- - BP - saw PCP Duanne Moron) 05/26/2019 - BMP 05/28/2019 reviewed and showed sodium 138, potassium 3.5, creatinine 1.62 and GFR 41  3: DM- - saw endocrinology Honor Junes) 05/02/2019

## 2019-06-21 ENCOUNTER — Ambulatory Visit: Payer: Medicare Other | Admitting: Family

## 2019-07-16 ENCOUNTER — Ambulatory Visit (INDEPENDENT_AMBULATORY_CARE_PROVIDER_SITE_OTHER): Payer: Medicare Other | Admitting: Podiatry

## 2019-07-16 ENCOUNTER — Other Ambulatory Visit: Payer: Self-pay

## 2019-07-16 ENCOUNTER — Encounter: Payer: Self-pay | Admitting: Podiatry

## 2019-07-16 ENCOUNTER — Ambulatory Visit: Payer: Medicare Other | Admitting: Podiatry

## 2019-07-16 VITALS — Temp 98.0°F

## 2019-07-16 DIAGNOSIS — B351 Tinea unguium: Secondary | ICD-10-CM | POA: Diagnosis not present

## 2019-07-16 DIAGNOSIS — D689 Coagulation defect, unspecified: Secondary | ICD-10-CM

## 2019-07-16 DIAGNOSIS — E1142 Type 2 diabetes mellitus with diabetic polyneuropathy: Secondary | ICD-10-CM

## 2019-07-16 DIAGNOSIS — M79676 Pain in unspecified toe(s): Secondary | ICD-10-CM

## 2019-07-16 NOTE — Progress Notes (Signed)
Complaint:  Visit Type: Patient returns to my office for continued preventative foot care services. Complaint: Patient states" my nails have grown long and thick and become painful to walk and wear shoes" Patient has been diagnosed with DM with no foot complications. The patient presents for preventative foot care services. No changes to ROS.  Patient on eliquiss..  Podiatric Exam: Vascular: dorsalis pedis and posterior tibial pulses are palpable bilateral. Capillary return is immediate. Temperature gradient is WNL. Skin turgor WNL  Sensorium: Diminished  Semmes Weinstein monofilament test. Normal tactile sensation bilaterally. Nail Exam: Pt has thick disfigured discolored nails with subungual debris noted bilateral entire nail hallux through fifth toenails Ulcer Exam: There is no evidence of ulcer or pre-ulcerative changes or infection. Orthopedic Exam: Muscle tone and strength are WNL. No limitations in general ROM. No crepitus or effusions noted. Foot type and digits show no abnormalities. HAV   B/L Skin: No Porokeratosis. No infection or ulcers.    Diagnosis:  Onychomycosis, , Pain in right toe, pain in left toes  Treatment & Plan Procedures and Treatment: Consent by patient was obtained for treatment procedures. The patient understood the discussion of treatment and procedures well. All questions were answered thoroughly reviewed. Debridement of mycotic and hypertrophic toenails, 1 through 5 bilateral and clearing of subungual debris. No ulceration, no infection noted.  Return Visit-Office Procedure: Patient instructed to return to the office for a follow up visit 3 months for continued evaluation and treatment.    Kataleah Bejar DPM 

## 2019-10-15 ENCOUNTER — Other Ambulatory Visit: Payer: Self-pay

## 2019-10-15 ENCOUNTER — Ambulatory Visit (INDEPENDENT_AMBULATORY_CARE_PROVIDER_SITE_OTHER): Payer: Medicare Other | Admitting: Podiatry

## 2019-10-15 ENCOUNTER — Encounter: Payer: Self-pay | Admitting: Podiatry

## 2019-10-15 DIAGNOSIS — E1142 Type 2 diabetes mellitus with diabetic polyneuropathy: Secondary | ICD-10-CM

## 2019-10-15 DIAGNOSIS — M2010 Hallux valgus (acquired), unspecified foot: Secondary | ICD-10-CM | POA: Diagnosis not present

## 2019-10-15 DIAGNOSIS — M79609 Pain in unspecified limb: Secondary | ICD-10-CM

## 2019-10-15 DIAGNOSIS — D689 Coagulation defect, unspecified: Secondary | ICD-10-CM

## 2019-10-15 DIAGNOSIS — B351 Tinea unguium: Secondary | ICD-10-CM | POA: Diagnosis not present

## 2019-10-15 NOTE — Progress Notes (Signed)
Complaint:  Visit Type: Patient returns to my office for continued preventative foot care services. Complaint: Patient states" my nails have grown long and thick and become painful to walk and wear shoes" Patient has been diagnosed with DM with no foot complications. The patient presents for preventative foot care services. No changes to ROS.  Patient on eliquiss..  Podiatric Exam: Vascular: dorsalis pedis and posterior tibial pulses are palpable bilateral. Capillary return is immediate. Temperature gradient is WNL. Skin turgor WNL  Sensorium: Diminished  Semmes Weinstein monofilament test. Normal tactile sensation bilaterally. Nail Exam: Pt has thick disfigured discolored nails with subungual debris noted bilateral entire nail hallux through fifth toenails Ulcer Exam: There is no evidence of ulcer or pre-ulcerative changes or infection. Orthopedic Exam: Muscle tone and strength are WNL. No limitations in general ROM. No crepitus or effusions noted. Foot type and digits show no abnormalities. HAV   B/L Skin: No Porokeratosis. No infection or ulcers.    Diagnosis:  Onychomycosis, , Pain in right toe, pain in left toes  Treatment & Plan Procedures and Treatment: Consent by patient was obtained for treatment procedures. The patient understood the discussion of treatment and procedures well. All questions were answered thoroughly reviewed. Debridement of mycotic and hypertrophic toenails, 1 through 5 bilateral and clearing of subungual debris. No ulceration, no infection noted.  Return Visit-Office Procedure: Patient instructed to return to the office for a follow up visit 3 months for continued evaluation and treatment.    Deke Tilghman DPM 

## 2019-10-29 ENCOUNTER — Inpatient Hospital Stay
Admission: EM | Admit: 2019-10-29 | Discharge: 2019-11-06 | DRG: 250 | Disposition: A | Discharge status: Skilled Nursing Facility | Payer: Medicare Other | Attending: Internal Medicine | Admitting: Internal Medicine

## 2019-10-29 ENCOUNTER — Emergency Department: Payer: Medicare Other

## 2019-10-29 ENCOUNTER — Encounter: Payer: Self-pay | Admitting: Emergency Medicine

## 2019-10-29 ENCOUNTER — Inpatient Hospital Stay: Payer: Medicare Other

## 2019-10-29 ENCOUNTER — Inpatient Hospital Stay: Admit: 2019-10-29 | Payer: Medicare Other

## 2019-10-29 ENCOUNTER — Other Ambulatory Visit: Payer: Self-pay

## 2019-10-29 DIAGNOSIS — J189 Pneumonia, unspecified organism: Secondary | ICD-10-CM

## 2019-10-29 DIAGNOSIS — G9341 Metabolic encephalopathy: Secondary | ICD-10-CM | POA: Diagnosis present

## 2019-10-29 DIAGNOSIS — E669 Obesity, unspecified: Secondary | ICD-10-CM | POA: Diagnosis present

## 2019-10-29 DIAGNOSIS — R680 Hypothermia, not associated with low environmental temperature: Secondary | ICD-10-CM | POA: Diagnosis present

## 2019-10-29 DIAGNOSIS — Z960 Presence of urogenital implants: Secondary | ICD-10-CM | POA: Diagnosis present

## 2019-10-29 DIAGNOSIS — Z6830 Body mass index (BMI) 30.0-30.9, adult: Secondary | ICD-10-CM | POA: Diagnosis not present

## 2019-10-29 DIAGNOSIS — I4821 Permanent atrial fibrillation: Secondary | ICD-10-CM | POA: Diagnosis present

## 2019-10-29 DIAGNOSIS — E785 Hyperlipidemia, unspecified: Secondary | ICD-10-CM | POA: Diagnosis present

## 2019-10-29 DIAGNOSIS — K5900 Constipation, unspecified: Secondary | ICD-10-CM

## 2019-10-29 DIAGNOSIS — I4510 Unspecified right bundle-branch block: Secondary | ICD-10-CM | POA: Diagnosis present

## 2019-10-29 DIAGNOSIS — E1169 Type 2 diabetes mellitus with other specified complication: Secondary | ICD-10-CM | POA: Diagnosis present

## 2019-10-29 DIAGNOSIS — E11649 Type 2 diabetes mellitus with hypoglycemia without coma: Secondary | ICD-10-CM | POA: Diagnosis present

## 2019-10-29 DIAGNOSIS — R7989 Other specified abnormal findings of blood chemistry: Secondary | ICD-10-CM

## 2019-10-29 DIAGNOSIS — E1159 Type 2 diabetes mellitus with other circulatory complications: Secondary | ICD-10-CM | POA: Diagnosis present

## 2019-10-29 DIAGNOSIS — I959 Hypotension, unspecified: Secondary | ICD-10-CM | POA: Diagnosis present

## 2019-10-29 DIAGNOSIS — A419 Sepsis, unspecified organism: Secondary | ICD-10-CM | POA: Diagnosis not present

## 2019-10-29 DIAGNOSIS — E1122 Type 2 diabetes mellitus with diabetic chronic kidney disease: Secondary | ICD-10-CM | POA: Diagnosis present

## 2019-10-29 DIAGNOSIS — Z7984 Long term (current) use of oral hypoglycemic drugs: Secondary | ICD-10-CM

## 2019-10-29 DIAGNOSIS — M25512 Pain in left shoulder: Secondary | ICD-10-CM | POA: Diagnosis present

## 2019-10-29 DIAGNOSIS — Z20828 Contact with and (suspected) exposure to other viral communicable diseases: Secondary | ICD-10-CM | POA: Diagnosis present

## 2019-10-29 DIAGNOSIS — I252 Old myocardial infarction: Secondary | ICD-10-CM

## 2019-10-29 DIAGNOSIS — M25562 Pain in left knee: Secondary | ICD-10-CM

## 2019-10-29 DIAGNOSIS — M25522 Pain in left elbow: Secondary | ICD-10-CM | POA: Diagnosis present

## 2019-10-29 DIAGNOSIS — I214 Non-ST elevation (NSTEMI) myocardial infarction: Secondary | ICD-10-CM | POA: Diagnosis present

## 2019-10-29 DIAGNOSIS — I21A1 Myocardial infarction type 2: Secondary | ICD-10-CM | POA: Diagnosis present

## 2019-10-29 DIAGNOSIS — I130 Hypertensive heart and chronic kidney disease with heart failure and stage 1 through stage 4 chronic kidney disease, or unspecified chronic kidney disease: Principal | ICD-10-CM | POA: Diagnosis present

## 2019-10-29 DIAGNOSIS — I2510 Atherosclerotic heart disease of native coronary artery without angina pectoris: Secondary | ICD-10-CM | POA: Diagnosis present

## 2019-10-29 DIAGNOSIS — Z888 Allergy status to other drugs, medicaments and biological substances status: Secondary | ICD-10-CM

## 2019-10-29 DIAGNOSIS — Z87891 Personal history of nicotine dependence: Secondary | ICD-10-CM

## 2019-10-29 DIAGNOSIS — Z7901 Long term (current) use of anticoagulants: Secondary | ICD-10-CM

## 2019-10-29 DIAGNOSIS — N1832 Chronic kidney disease, stage 3b: Secondary | ICD-10-CM | POA: Diagnosis present

## 2019-10-29 DIAGNOSIS — E162 Hypoglycemia, unspecified: Secondary | ICD-10-CM

## 2019-10-29 DIAGNOSIS — Z951 Presence of aortocoronary bypass graft: Secondary | ICD-10-CM

## 2019-10-29 DIAGNOSIS — Z955 Presence of coronary angioplasty implant and graft: Secondary | ICD-10-CM

## 2019-10-29 DIAGNOSIS — R0602 Shortness of breath: Secondary | ICD-10-CM

## 2019-10-29 DIAGNOSIS — I5023 Acute on chronic systolic (congestive) heart failure: Secondary | ICD-10-CM | POA: Diagnosis present

## 2019-10-29 DIAGNOSIS — R778 Other specified abnormalities of plasma proteins: Secondary | ICD-10-CM

## 2019-10-29 DIAGNOSIS — Z8249 Family history of ischemic heart disease and other diseases of the circulatory system: Secondary | ICD-10-CM

## 2019-10-29 DIAGNOSIS — Z79899 Other long term (current) drug therapy: Secondary | ICD-10-CM

## 2019-10-29 DIAGNOSIS — J90 Pleural effusion, not elsewhere classified: Secondary | ICD-10-CM

## 2019-10-29 DIAGNOSIS — Z9181 History of falling: Secondary | ICD-10-CM

## 2019-10-29 DIAGNOSIS — Z86010 Personal history of colonic polyps: Secondary | ICD-10-CM

## 2019-10-29 DIAGNOSIS — Z7982 Long term (current) use of aspirin: Secondary | ICD-10-CM

## 2019-10-29 LAB — CBC WITH DIFFERENTIAL/PLATELET
Abs Immature Granulocytes: 0.09 10*3/uL — ABNORMAL HIGH (ref 0.00–0.07)
Basophils Absolute: 0 10*3/uL (ref 0.0–0.1)
Basophils Relative: 0 %
Eosinophils Absolute: 0 10*3/uL (ref 0.0–0.5)
Eosinophils Relative: 0 %
HCT: 40.1 % (ref 39.0–52.0)
Hemoglobin: 13.4 g/dL (ref 13.0–17.0)
Immature Granulocytes: 1 %
Lymphocytes Relative: 7 %
Lymphs Abs: 1 10*3/uL (ref 0.7–4.0)
MCH: 30.8 pg (ref 26.0–34.0)
MCHC: 33.4 g/dL (ref 30.0–36.0)
MCV: 92.2 fL (ref 80.0–100.0)
Monocytes Absolute: 0.7 10*3/uL (ref 0.1–1.0)
Monocytes Relative: 5 %
Neutro Abs: 12.6 10*3/uL — ABNORMAL HIGH (ref 1.7–7.7)
Neutrophils Relative %: 87 %
Platelets: 226 10*3/uL (ref 150–400)
RBC: 4.35 MIL/uL (ref 4.22–5.81)
RDW: 13.8 % (ref 11.5–15.5)
WBC: 14.4 10*3/uL — ABNORMAL HIGH (ref 4.0–10.5)
nRBC: 0 % (ref 0.0–0.2)

## 2019-10-29 LAB — URINALYSIS, ROUTINE W REFLEX MICROSCOPIC
Bacteria, UA: NONE SEEN
Bilirubin Urine: NEGATIVE
Glucose, UA: >=500 mg/dL — AB
Ketones, ur: NEGATIVE mg/dL
Leukocytes,Ua: NEGATIVE
Nitrite: NEGATIVE
Protein, ur: >=300 mg/dL — AB
Specific Gravity, Urine: 1.017 (ref 1.005–1.030)
pH: 5 (ref 5.0–8.0)

## 2019-10-29 LAB — URINE DRUG SCREEN, QUALITATIVE (ARMC ONLY)
Amphetamines, Ur Screen: NOT DETECTED
Barbiturates, Ur Screen: NOT DETECTED
Benzodiazepine, Ur Scrn: NOT DETECTED
Cannabinoid 50 Ng, Ur ~~LOC~~: NOT DETECTED
Cocaine Metabolite,Ur ~~LOC~~: NOT DETECTED
MDMA (Ecstasy)Ur Screen: NOT DETECTED
Methadone Scn, Ur: NOT DETECTED
Opiate, Ur Screen: NOT DETECTED
Phencyclidine (PCP) Ur S: NOT DETECTED
Tricyclic, Ur Screen: NOT DETECTED

## 2019-10-29 LAB — GLUCOSE, CAPILLARY
Glucose-Capillary: 111 mg/dL — ABNORMAL HIGH (ref 70–99)
Glucose-Capillary: 13 mg/dL — CL (ref 70–99)
Glucose-Capillary: 147 mg/dL — ABNORMAL HIGH (ref 70–99)
Glucose-Capillary: 157 mg/dL — ABNORMAL HIGH (ref 70–99)
Glucose-Capillary: 185 mg/dL — ABNORMAL HIGH (ref 70–99)
Glucose-Capillary: 185 mg/dL — ABNORMAL HIGH (ref 70–99)
Glucose-Capillary: 226 mg/dL — ABNORMAL HIGH (ref 70–99)
Glucose-Capillary: 54 mg/dL — ABNORMAL LOW (ref 70–99)
Glucose-Capillary: 62 mg/dL — ABNORMAL LOW (ref 70–99)
Glucose-Capillary: 69 mg/dL — ABNORMAL LOW (ref 70–99)
Glucose-Capillary: 71 mg/dL (ref 70–99)
Glucose-Capillary: 85 mg/dL (ref 70–99)

## 2019-10-29 LAB — BRAIN NATRIURETIC PEPTIDE: B Natriuretic Peptide: 1511 pg/mL — ABNORMAL HIGH (ref 0.0–100.0)

## 2019-10-29 LAB — COMPREHENSIVE METABOLIC PANEL
ALT: 18 U/L (ref 0–44)
ALT: 19 U/L (ref 0–44)
AST: 37 U/L (ref 15–41)
AST: 38 U/L (ref 15–41)
Albumin: 3.5 g/dL (ref 3.5–5.0)
Albumin: 3.6 g/dL (ref 3.5–5.0)
Alkaline Phosphatase: 75 U/L (ref 38–126)
Alkaline Phosphatase: 80 U/L (ref 38–126)
Anion gap: 14 (ref 5–15)
Anion gap: 15 (ref 5–15)
BUN: 27 mg/dL — ABNORMAL HIGH (ref 8–23)
BUN: 30 mg/dL — ABNORMAL HIGH (ref 8–23)
CO2: 21 mmol/L — ABNORMAL LOW (ref 22–32)
CO2: 21 mmol/L — ABNORMAL LOW (ref 22–32)
Calcium: 8.7 mg/dL — ABNORMAL LOW (ref 8.9–10.3)
Calcium: 8.8 mg/dL — ABNORMAL LOW (ref 8.9–10.3)
Chloride: 102 mmol/L (ref 98–111)
Chloride: 102 mmol/L (ref 98–111)
Creatinine, Ser: 1.38 mg/dL — ABNORMAL HIGH (ref 0.61–1.24)
Creatinine, Ser: 1.5 mg/dL — ABNORMAL HIGH (ref 0.61–1.24)
GFR calc Af Amer: 52 mL/min — ABNORMAL LOW (ref >60)
GFR calc Af Amer: 58 mL/min — ABNORMAL LOW (ref >60)
GFR calc non Af Amer: 45 mL/min — ABNORMAL LOW (ref >60)
GFR calc non Af Amer: 50 mL/min — ABNORMAL LOW (ref >60)
Glucose, Bld: 147 mg/dL — ABNORMAL HIGH (ref 70–99)
Glucose, Bld: 215 mg/dL — ABNORMAL HIGH (ref 70–99)
Potassium: 3.6 mmol/L (ref 3.5–5.1)
Potassium: 4.7 mmol/L (ref 3.5–5.1)
Sodium: 137 mmol/L (ref 135–145)
Sodium: 138 mmol/L (ref 135–145)
Total Bilirubin: 1.5 mg/dL — ABNORMAL HIGH (ref 0.3–1.2)
Total Bilirubin: 1.8 mg/dL — ABNORMAL HIGH (ref 0.3–1.2)
Total Protein: 7.2 g/dL (ref 6.5–8.1)
Total Protein: 7.5 g/dL (ref 6.5–8.1)

## 2019-10-29 LAB — CK: Total CK: 465 U/L — ABNORMAL HIGH (ref 49–397)

## 2019-10-29 LAB — CBC
HCT: 43.3 % (ref 39.0–52.0)
Hemoglobin: 14.4 g/dL (ref 13.0–17.0)
MCH: 31 pg (ref 26.0–34.0)
MCHC: 33.3 g/dL (ref 30.0–36.0)
MCV: 93.1 fL (ref 80.0–100.0)
Platelets: 243 10*3/uL (ref 150–400)
RBC: 4.65 MIL/uL (ref 4.22–5.81)
RDW: 13.9 % (ref 11.5–15.5)
WBC: 17.6 10*3/uL — ABNORMAL HIGH (ref 4.0–10.5)
nRBC: 0 % (ref 0.0–0.2)

## 2019-10-29 LAB — LACTIC ACID, PLASMA
Lactic Acid, Venous: 6.6 mmol/L (ref 0.5–1.9)
Lactic Acid, Venous: 3.9 mmol/L (ref 0.5–1.9)

## 2019-10-29 LAB — PROTIME-INR
INR: 1.3 — ABNORMAL HIGH (ref 0.8–1.2)
Prothrombin Time: 16.4 seconds — ABNORMAL HIGH (ref 11.4–15.2)

## 2019-10-29 LAB — BLOOD GAS, ARTERIAL
Acid-base deficit: 0.3 mmol/L (ref 0.0–2.0)
Bicarbonate: 24.1 mmol/L (ref 20.0–28.0)
FIO2: 28
O2 Saturation: 97.9 %
Patient temperature: 37
pCO2 arterial: 38 mmHg (ref 32.0–48.0)
pH, Arterial: 7.41 (ref 7.350–7.450)
pO2, Arterial: 102 mmHg (ref 83.0–108.0)

## 2019-10-29 LAB — HEMOGLOBIN A1C
Hgb A1c MFr Bld: 6.5 % — ABNORMAL HIGH (ref 4.8–5.6)
Mean Plasma Glucose: 139.85 mg/dL

## 2019-10-29 LAB — TROPONIN I (HIGH SENSITIVITY)
Troponin I (High Sensitivity): 2140 ng/L (ref <18)
Troponin I (High Sensitivity): 262 ng/L (ref <18)
Troponin I (High Sensitivity): 555 ng/L (ref <18)

## 2019-10-29 LAB — TSH: TSH: 1.547 u[IU]/mL (ref 0.350–4.500)

## 2019-10-29 LAB — SARS CORONAVIRUS 2 (TAT 6-24 HRS): SARS Coronavirus 2: NEGATIVE

## 2019-10-29 LAB — APTT: aPTT: 35 seconds (ref 24–36)

## 2019-10-29 LAB — POC SARS CORONAVIRUS 2 AG: SARS Coronavirus 2 Ag: NEGATIVE

## 2019-10-29 MED ORDER — DEXTROSE 10 % IV SOLN
INTRAVENOUS | Status: DC
  Administered 2019-10-29: via INTRAVENOUS

## 2019-10-29 MED ORDER — DEXTROSE 50 % IV SOLN
INTRAVENOUS | Status: AC
  Administered 2019-10-29: 50 mL
  Filled 2019-10-29: qty 50

## 2019-10-29 MED ORDER — SODIUM CHLORIDE 0.9 % IV SOLN
INTRAVENOUS | Status: DC
  Administered 2019-10-29: 05:00:00 via INTRAVENOUS

## 2019-10-29 MED ORDER — FUROSEMIDE 10 MG/ML IJ SOLN: 40.0000 mg | Freq: Every day | INTRAMUSCULAR | Status: DC

## 2019-10-29 MED ORDER — SODIUM CHLORIDE 0.9 % IV SOLN: 2.0000 g | Freq: Three times a day (TID) | INTRAVENOUS | Status: DC

## 2019-10-29 MED ORDER — ASPIRIN EC 81 MG PO TBEC
81.0000 mg | DELAYED_RELEASE_TABLET | Freq: Every day | ORAL | Status: DC
  Administered 2019-10-29 – 2019-11-06 (×8): 81 mg via ORAL
  Filled 2019-10-29 (×8): qty 1

## 2019-10-29 MED ORDER — HEPARIN (PORCINE) 25000 UT/250ML-% IV SOLN
1100.0000 [IU]/h | INTRAVENOUS | Status: DC
  Administered 2019-10-29: 16:00:00 1300 [IU]/h via INTRAVENOUS
  Filled 2019-10-29: qty 250

## 2019-10-29 MED ORDER — HEPARIN SODIUM (PORCINE) 5000 UNIT/ML IJ SOLN: 5000.0000 [IU] | Freq: Three times a day (TID) | INTRAMUSCULAR | Status: DC

## 2019-10-29 MED ORDER — ATROPINE SULFATE 1 MG/10ML IJ SOSY
PREFILLED_SYRINGE | INTRAMUSCULAR | Status: AC
  Filled 2019-10-29: qty 10

## 2019-10-29 MED ORDER — METRONIDAZOLE IN NACL 5-0.79 MG/ML-% IV SOLN
500.0000 mg | Freq: Once | INTRAVENOUS | Status: DC
  Filled 2019-10-29: qty 100

## 2019-10-29 MED ORDER — SODIUM CHLORIDE 0.9 % IV SOLN: 2.0000 g | Freq: Once | INTRAVENOUS | Status: DC

## 2019-10-29 MED ORDER — VANCOMYCIN HCL IN DEXTROSE 1-5 GM/200ML-% IV SOLN
1000.0000 mg | INTRAVENOUS | Status: DC
  Administered 2019-10-30: 1000 mg via INTRAVENOUS
  Filled 2019-10-29: qty 200

## 2019-10-29 MED ORDER — HEPARIN BOLUS VIA INFUSION
4000.0000 [IU] | Freq: Once | INTRAVENOUS | Status: AC
  Administered 2019-10-29: 16:00:00 4000 [IU] via INTRAVENOUS
  Filled 2019-10-29: qty 4000

## 2019-10-29 MED ORDER — SODIUM CHLORIDE 0.9 % IV SOLN
2.0000 g | Freq: Once | INTRAVENOUS | Status: AC
  Administered 2019-10-29: 01:00:00 2 g via INTRAVENOUS
  Filled 2019-10-29: qty 2

## 2019-10-29 MED ORDER — FUROSEMIDE 10 MG/ML IJ SOLN
40.0000 mg | Freq: Once | INTRAMUSCULAR | Status: AC
  Administered 2019-10-29: 03:00:00 40 mg via INTRAVENOUS
  Filled 2019-10-29: qty 4

## 2019-10-29 MED ORDER — SODIUM CHLORIDE 0.9 % IV SOLN
2.0000 g | Freq: Two times a day (BID) | INTRAVENOUS | Status: DC
  Administered 2019-10-29 (×2): 2 g via INTRAVENOUS
  Filled 2019-10-29 (×4): qty 2

## 2019-10-29 MED ORDER — ACETAMINOPHEN 650 MG RE SUPP: 650.0000 mg | Freq: Four times a day (QID) | RECTAL | Status: DC | PRN

## 2019-10-29 MED ORDER — ONDANSETRON HCL 4 MG/2ML IJ SOLN
4.0000 mg | Freq: Four times a day (QID) | INTRAMUSCULAR | Status: DC | PRN
  Administered 2019-10-29: 06:00:00 4 mg via INTRAVENOUS
  Filled 2019-10-29: qty 2

## 2019-10-29 MED ORDER — VANCOMYCIN HCL IN DEXTROSE 1-5 GM/200ML-% IV SOLN
1000.0000 mg | Freq: Once | INTRAVENOUS | Status: AC
  Administered 2019-10-29: 02:00:00 1000 mg via INTRAVENOUS
  Filled 2019-10-29: qty 200

## 2019-10-29 MED ORDER — DIGOXIN 125 MCG PO TABS: 0.1250 mg | ORAL_TABLET | Freq: Every day | ORAL | Status: DC

## 2019-10-29 MED ORDER — ATORVASTATIN CALCIUM 80 MG PO TABS
80.0000 mg | ORAL_TABLET | Freq: Every day | ORAL | Status: DC
  Administered 2019-10-29 – 2019-11-06 (×8): 80 mg via ORAL
  Filled 2019-10-29: qty 1
  Filled 2019-10-29: qty 4
  Filled 2019-10-29 (×6): qty 1

## 2019-10-29 MED ORDER — ACETAMINOPHEN 325 MG PO TABS
650.0000 mg | ORAL_TABLET | Freq: Four times a day (QID) | ORAL | Status: DC | PRN
  Administered 2019-11-02 – 2019-11-06 (×9): 650 mg via ORAL
  Filled 2019-10-29 (×9): qty 2

## 2019-10-29 MED ORDER — GABAPENTIN 300 MG PO CAPS
300.0000 mg | ORAL_CAPSULE | Freq: Every day | ORAL | Status: DC
  Administered 2019-10-29 – 2019-11-05 (×8): 300 mg via ORAL
  Filled 2019-10-29 (×8): qty 1

## 2019-10-29 MED ORDER — METRONIDAZOLE IN NACL 5-0.79 MG/ML-% IV SOLN
500.0000 mg | Freq: Three times a day (TID) | INTRAVENOUS | Status: DC
  Administered 2019-10-29 – 2019-10-30 (×4): 500 mg via INTRAVENOUS
  Filled 2019-10-29 (×6): qty 100

## 2019-10-29 MED ORDER — VANCOMYCIN HCL IN DEXTROSE 1-5 GM/200ML-% IV SOLN
1000.0000 mg | Freq: Once | INTRAVENOUS | Status: AC
  Administered 2019-10-29: 10:00:00 1000 mg via INTRAVENOUS
  Filled 2019-10-29: qty 200

## 2019-10-29 MED ORDER — ONDANSETRON HCL 4 MG PO TABS: 4.0000 mg | ORAL_TABLET | Freq: Four times a day (QID) | ORAL | Status: DC | PRN

## 2019-10-29 MED ORDER — THIAMINE HCL 100 MG/ML IJ SOLN
Freq: Once | INTRAVENOUS | Status: DC
  Filled 2019-10-29: qty 1000

## 2019-10-29 MED ORDER — APIXABAN 5 MG PO TABS: 5.0000 mg | ORAL_TABLET | Freq: Two times a day (BID) | ORAL | Status: DC

## 2019-10-29 MED ORDER — SODIUM CHLORIDE 0.9% FLUSH
3.0000 mL | Freq: Two times a day (BID) | INTRAVENOUS | Status: DC
  Administered 2019-10-30 – 2019-11-06 (×14): 3 mL via INTRAVENOUS

## 2019-10-29 MED ORDER — ALBUTEROL SULFATE (2.5 MG/3ML) 0.083% IN NEBU: 2.5000 mg | INHALATION_SOLUTION | RESPIRATORY_TRACT | Status: DC | PRN

## 2019-10-29 MED ORDER — INSULIN ASPART 100 UNIT/ML ~~LOC~~ SOLN: 0.0000 [IU] | SUBCUTANEOUS | Status: DC

## 2019-10-29 NOTE — ED Notes (Signed)
Pt st " I feel like I need some oxygen, it feels harder to breath, Ive got some of that fluid on my lungs sometimes".   Pt has faint wheezes in the LT mid lobe. MD notified

## 2019-10-29 NOTE — ED Triage Notes (Signed)
Pt to ED via EMS for a fall. Upon arrival EMS noted pt on the floor sitting and disoriented. After checking, pt's blood glucose was 50. Pt lives alone at home and is a diabetic. Pt answering questions appropriately but is sleepy. MD at bedside

## 2019-10-29 NOTE — Progress Notes (Signed)
Pharmacy Antibiotic Note  Jeremiah Chapman is a 73 y.o. male admitted on 10/29/2019 with sepsis.  Pharmacy has been consulted for Cefepime and Vancomycin dosing.  Initial doses given in ED  Plan: Cefepime 2gm IV q8hrs (renal fxn borderline for adjustment)  Vancomycin 1000 mg IV Q 24 hrs. Goal AUC 400-550. Expected AUC: 508.7 SCr used: 1.5  Height: 5\' 10"  (177.8 cm) Weight: 212 lb (96.2 kg) IBW/kg (Calculated) : 73  Temp (24hrs), Avg:92.6 F (33.7 C), Min:92.4 F (33.6 C), Max:92.7 F (33.7 C)  Recent Labs  Lab 10/29/19 0016 10/29/19 0106  WBC 17.6*  --   CREATININE 1.50*  --   LATICACIDVEN  --  6.6*    Estimated Creatinine Clearance: 49.5 mL/min (A) (by C-G formula based on SCr of 1.5 mg/dL (H)).    Allergies  Allergen Reactions  . Sitagliptin Rash and Other (See Comments)    Januvia   Antimicrobials this admission: Cefepime 11/30 >>  Vancomycin 11/30 >>  Flagyl 11/30 >>  Dose adjustments this admission:   Microbiology results:  BCx:   UCx:    Sputum:    MRSA PCR:   Thank you for allowing pharmacy to be a part of this patient's care.  Hart Robinsons A 10/29/2019 3:03 AM

## 2019-10-29 NOTE — ED Notes (Signed)
Admitting MD at the bedside for pt evaluation.  

## 2019-10-29 NOTE — ED Notes (Addendum)
Pt appears has snoring respirations but is arousable and can answer questions correctly. EDP at bedside

## 2019-10-29 NOTE — ED Notes (Addendum)
Pt returned from CT and is resting on stretcher with lights off to enhance rest. IV fluids infusing without difficulty. Pt sates he was able to void in urinal. Urine collected. Pt reports he is feeling better. No increased work of breathing noted at this time. Bair hugger removed at this time. Provided for comfort and safety and will continue to assess.

## 2019-10-29 NOTE — ED Notes (Signed)
Patient is eating his dinner tray after initially refusing, stating, "I'm not hungry."

## 2019-10-29 NOTE — ED Notes (Addendum)
Pt c/o nausea, see MAR. Pt was given ice chips per request and repositioned onto his side for comfort

## 2019-10-29 NOTE — ED Notes (Signed)
Pt taken to CT.

## 2019-10-29 NOTE — Progress Notes (Signed)
OT Cancellation Note  Patient Details Name: ABRON NEDDO MRN: [id number] DOB: [birth year]   Cancelled Treatment:    Reason Eval/Treat Not Completed: Medical issues which prohibited therapy. Consult received, chart reviewed. Patient noted with uptrending troponin (2,140, pending cardiology consult).  Will hold at this time and re-attempt as medically appropriate and available.  Jeni Salles, MPH, MS, OTR/L ascom [phone] 10/29/19, 11:46 AM

## 2019-10-29 NOTE — ED Notes (Signed)
Patient could not use bed pan. Bedside commode brought into room and patient was assisted to commode with 2 person assist.

## 2019-10-29 NOTE — Consult Note (Addendum)
[facility]  CARDIOLOGY CONSULT NOTE  Patient ID: Jeremiah Chapman MRN: [id number] DOB/AGE: 1946-07-18 73 y.o.  Admit date: 10/29/2019 Referring Physician Dr. Mal Misty Primary Physician Dr. Lovie Macadamia Primary Cardiologist Dr. Saralyn Pilar Reason for Consultation Elevated troponin, SOB, CHF  HPI:  Jeremiah Chapman is a 73 y.o. with a past medical history of coronary artery disease s/p CABG x2 in 1998 at [facility] with a LIMA to LAD, and SVG to OM2, also with NSTEMI and stent to LCx in 02/2017, paroxysmal atrial fibrillation, anticoagulated on Eliquis, heart failure with intermediate ejection fraction (last ECHO from 04/2019 with EF of 40%), HTN, HLD, T2DM, who presented to the ED yesterday after possible syncopal event. Patient recalls sitting on his couch watching TV, and then next thing he remembers is waking up on the ground after an unknown amount of time. He called 911 himself, however, then realized that his front door would be locked, so called a neighbor to open the front door. He does not remember having any pain or other symptoms prior to this event. He states that aside from some gradual worsened of his shortness of breath during the last two to three weeks, he has been doing generally well aside from that. He had been taking furosemide 20 mg daily for the last two weeks for management of this shortness of breath. Patient does report having cest and epigastric discomfort which begun after his ER arrival. It was at his worse last night when he was feeling more short of breath, however, he has no pain currently.   Upon arrival to the ER, the patient was found to be hypoglycemic with a blood sugar of 16. He received 2 amps of 250 and then placed on D5 NS. He was also noted to be hypothermic to 33.7 C. Blood pressure 98/70, heart rate 61, sats 100%. Code sepsis was called and patient started on broad spectrum antibiotics with Cefepime and Vancomycin. WBC was 17. However, UA negative, and CXR without  evidence of pneumonia. He did have right sided pleural effusion - also seen on CT chest. Other notable labs included elevated troponin with upwards trend - 262 > 555 > 2140. Also with elevated BNP to 1,511.   Review of systems complete and found to be negative unless listed above   Past Medical History:  Diagnosis Date  . Atrial fibrillation ([place])   . CAD (coronary artery disease)   . CHF (congestive heart failure) ([place])   . Diabetes mellitus without complication ([place])   . Gout   . Hypercholesterolemia   . Hypertension     Past Surgical History:  Procedure Laterality Date  . CARDIAC CATHETERIZATION    . CARDIOVERSION N/A 01/20/2017   Procedure: CARDIOVERSION;  Surgeon: Isaias Cowman, MD;  Location: [facility] ORS;  Service: Cardiovascular;  Laterality: N/A;  . COLONOSCOPY    . CORONARY ARTERY BYPASS GRAFT    . CORONARY STENT INTERVENTION N/A 03/28/2017   Procedure: Coronary Stent Intervention;  Surgeon: Isaias Cowman, MD;  Location: [facility];  Service: Cardiovascular;  Laterality: N/A;  . LEFT HEART CATH AND CORONARY ANGIOGRAPHY N/A 03/28/2017   Procedure: Left Heart Cath and Coronary Angiography;  Surgeon: Isaias Cowman, MD;  Location: [facility];  Service: Cardiovascular;  Laterality: N/A;  . limbar back surgery    . PENILE PROSTHESIS IMPLANT      (Not in a hospital admission)  Social History   Socioeconomic History  . Marital status: Divorced    Spouse name: Not on  file  . Number of children: Not on file  . Years of education: Not on file  . Highest education level: Not on file  Occupational History  . Not on file  Social Needs  . Financial resource strain: Not on file  . Food insecurity    Worry: Not on file    Inability: Not on file  . Transportation needs    Medical: Not on file    Non-medical: Not on file  Tobacco Use  . Smoking status: Former Smoker    Packs/day: 0.50    Years: 50.00    Pack years: 25.00    Types: Cigarettes   . Smokeless tobacco: Current User  Substance and Sexual Activity  . Alcohol use: No    Alcohol/week: 0.0 standard drinks  . Drug use: No  . Sexual activity: Not on file  Lifestyle  . Physical activity    Days per week: Not on file    Minutes per session: Not on file  . Stress: Not on file  Relationships  . Social Musician on phone: Not on file    Gets together: Not on file    Attends religious service: Not on file    Active member of club or organization: Not on file    Attends meetings of clubs or organizations: Not on file    Relationship status: Not on file  . Intimate partner violence    Fear of current or ex partner: Not on file    Emotionally abused: Not on file    Physically abused: Not on file    Forced sexual activity: Not on file  Other Topics Concern  . Not on file  Social History Narrative  . Not on file    Family History  Problem Relation Age of Onset  . Hypertension Mother    Review of systems complete and found to be negative unless listed above    PHYSICAL EXAM  General: Well developed, well nourished, in no acute distress HEENT:  Normocephalic and atramatic Neck:  No JVD.  Lungs: Clear bilaterally to auscultation and percussion. Heart: Irregularly irregular. Bradycardic rate to 50s.. Normal S1 and S2 without gallops or murmurs.  Extremities: 2+ pitting edema to mid shins bilaterally, L > R.    Neuro: Alert and oriented X 3. Psych:  Good affect, responds appropriately  Labs:   Lab Results  Component Value Date   WBC 14.4 (H) 10/29/2019   HGB 13.4 10/29/2019   HCT 40.1 10/29/2019   MCV 92.2 10/29/2019   PLT 226 10/29/2019    Recent Labs  Lab 10/29/19 0301  NA 137  K 4.7  CL 102  CO2 21*  BUN 30*  CREATININE 1.38*  CALCIUM 8.7*  PROT 7.2  BILITOT 1.8*  ALKPHOS 75  ALT 19  AST 37  GLUCOSE 215*   Lab Results  Component Value Date   CKTOTAL 465 (H) 10/29/2019   TROPONINI 0.14 (HH) 04/21/2017   No results found for:  CHOL No results found for: HDL No results found for: LDLCALC No results found for: TRIG No results found for: CHOLHDL No results found for: LDLDIRECT    Radiology: Ct Head Wo Contrast  Result Date: 10/29/2019 CLINICAL DATA:  Fall EXAM: CT HEAD WITHOUT CONTRAST TECHNIQUE: Contiguous axial images were obtained from the base of the skull through the vertex without intravenous contrast. COMPARISON:  None. FINDINGS: Brain: No evidence of acute territorial infarction, hemorrhage, hydrocephalus,extra-axial collection or mass lesion/mass effect. There is  dilatation the ventricles and sulci consistent with age-related atrophy. Low-attenuation changes in the deep white matter consistent with small vessel ischemia. 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 Skull: The skull is intact. No fracture or focal lesion identified. Sinuses/Orbits: The visualized paranasal sinuses and mastoid air cells are clear. The orbits and globes intact. Other: None IMPRESSION: No acute intracranial abnormality. 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 Electronically Signed   By: Jonna ClarkBindu  Avutu M.D.   On: 10/29/2019 01:29   Ct Chest Wo Contrast  Result Date: 10/29/2019 CLINICAL DATA:  Shortness of breath EXAM: CT CHEST WITHOUT CONTRAST TECHNIQUE: Multidetector CT imaging of the chest was performed following the standard protocol without IV contrast. COMPARISON:  None. FINDINGS: Cardiovascular: The heart is enlarged. No pericardial effusion. Surgical changes from coronary artery bypass surgery are noted. Extensive three-vessel coronary artery calcifications. Moderate aortic calcifications but no aneurysm. Mediastinum/Nodes: Small scattered mediastinal and hilar lymph nodes but no mass or overt adenopathy. Lungs/Pleura: There is a moderate-size right pleural effusion with  overlying atelectasis. There is also loculated pleural fluid anteriorly with overlying right middle lobe atelectasis which has the appearance of rounded atelectasis. There is also a small left pleural effusion with minimal overlying atelectasis. I do not see any findings for pulmonary edema or worrisome pulmonary nodules. Emphysematous changes are noted. Upper Abdomen: No significant upper abdominal findings. Musculoskeletal: No chest wall mass, supraclavicular or axillary adenopathy. The thyroid gland is grossly normal. The bony structures are intact. IMPRESSION: 1. Moderate-sized right pleural effusion with overlying atelectasis. Anterior loculated component is noted with overlying probable rounded atelectasis. I would recommend a follow-up chest CT in 3 months to make sure this resolves or is at least stable. 2. No definite pulmonary mass or worrisome pulmonary nodules. 3. Small left effusion with minimal overlying atelectasis. 4. Cardiac enlargement. 5. 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 Aortic Atherosclerosis (ICD10-I70.0) and Emphysema (ICD10-J43.9). Electronically Signed   By: Rudie MeyerP.  Gallerani M.D.   On: 10/29/2019 07:23   Dg Chest Portable 1 View  Result Date: 10/29/2019 CLINICAL DATA:  Hypoglycemia EXAM: PORTABLE CHEST 1 VIEW COMPARISON:  May 26, 2019 FINDINGS: The cardiomediastinal silhouette is unchanged from prior exam. Overlying median sternotomy wires. There is a small right pleural effusion, as on the prior exam. There is mild prominence of the central pulmonary vasculature. No acute osseous abnormality. IMPRESSION: Small right pleural effusion as on prior exam. Findings suggestive of mild pulmonary vascular congestion. Electronically Signed   By: Jonna ClarkBindu  Avutu M.D.   On: 10/29/2019 00:37    EKG: Atrial fibrillation, rate of 75 BPM, RBBB, no significant ST-T wave changes suggestive of acute ischemia  ASSESSMENT AND PLAN:   Jeremiah Chapman is a 73 year old male with a history significant for CAD s/p CABG x 2 in 1998 with LIMA to LAD, and SVG to OM2, also with stent to LCx in 2018 following NSTEMI, permanent atrial fibrillation anticoagulated on Eliquis, heart failure with intermediate ejection fraction (EF 40% in 04/2019), HTN, HLD, T2DM, and COPD, who presented to the ER yesterday after a possible syncopal event. Patient amnestic to event. In the ER he was found to be signifiacntly hypoglycemic. Cardiology consulted for elevated troponins and CHF exacerbation.   Elevated troponin: History of CAD s/p CABG x2 in 1998 and stent to LCx in 2018: Patient brought in to ER yesterday by  EMS following a likely syncopal event. Troponin trend - 262 > 555 > 2140. Patient begun having epigastric and lower chest discomfort described as pressure following arrival to the ER. No chest pain currently and no changes on EKG to suggest acute ischemia. Consistent with NSTEMI based on troponin. Will plan for cardiac catheterization tomorrow, allowing at least 48 hours since last Eliquis dose.  - Plan for cardiac catheterization - continue heparin drip  - Continue to trend troponins - Continue close monitoring on telemetry   CHF exacerbation: Heart failure with intermediate ejection fraction: Last ECHO from 04/2019 with EF of 40%. Patient with elevated BNP to >1,700 and signs of right pleural effusion on CXR and CT chest consistent with acute CHF exacerbation. Has received one time dose of lasix 40 mg IV for diuresis with mild improvement of his shortness of breath. Patient continues to have signs of fluid overload on exam, so will continue with gentle diuresis given soft pressures.  - Continue diuresis with IV lasix 40 mg daily    Permanent Atrial fibrillation: Heart rate between 40 - 60 BPM during evaluation. Home meds include metoprolol succinate 25 mg daily. No rapid ventricular response of signs of sick sinus syndrome currently.  - Continue  to hold metoprolol due to borderline bradycardia currently - Hold Eliquis at this time   The patient's history and exam findings were discussed with Dr. Gwen Pounds. The plan was made in conjunction with Dr. Gwen Pounds.  Signed: Harrell Gave PA-C 10/29/2019, 4:18 PM  The patient has been interviewed and examined. I agree with assessment and plan above. Arnoldo Hooker MD [facility]

## 2019-10-29 NOTE — ED Notes (Signed)
Pt presenting with new skin tears to the left hand and right upper forearm. Bleeding controled with notable bruising around the site. Pt st he lost consciousness and woke up in the middle of his kitchen floor but denies pain

## 2019-10-29 NOTE — ED Notes (Signed)
SD will take patient after 11p

## 2019-10-29 NOTE — Progress Notes (Signed)
Pharmacy Antibiotic Note  Jeremiah Chapman is a 73 y.o. male admitted on 10/29/2019 with sepsis.  Pharmacy has been consulted for Cefepime and Vancomycin dosing.  Initial doses given in ED  Plan: Cefepime 2gm IV q12hrs  Vancomycin 1000 mg IV Q 24 hrs. Goal AUC 400-550. Expected AUC: 472 SCr used: 1.38  Height: 5\' 10"  (177.8 cm) Weight: 212 lb (96.2 kg) IBW/kg (Calculated) : 73  Temp (24hrs), Avg:95 F (35 C), Min:92.4 F (33.6 C), Max:97.8 F (36.6 C)  Recent Labs  Lab 10/29/19 0016 10/29/19 0106 10/29/19 0301  WBC 17.6*  --  14.4*  CREATININE 1.50*  --  1.38*  LATICACIDVEN  --  6.6* 3.9*    Estimated Creatinine Clearance: 53.8 mL/min (A) (by C-G formula based on SCr of 1.38 mg/dL (H)).    Allergies  Allergen Reactions  . Sitagliptin Rash and Other (See Comments)    Januvia   Antimicrobials this admission: Cefepime 11/30 >>  Vancomycin 11/30 >>  Flagyl 11/30 >>  Dose adjustments this admission: Adj Cefepime from 2g q8 to 2g q12 (CrCl btw 30-59 ml/min)  Microbiology results:  BCx: 11/30 pending  UCx:  11/30 pending   COVID19 PENDING  Thank you for allowing pharmacy to be a part of this patient's care.  Lu Duffel, PharmD, BCPS Clinical Pharmacist 10/29/2019 9:20 AM

## 2019-10-29 NOTE — ED Notes (Signed)
Patient given ED meal tray and apple juice.

## 2019-10-29 NOTE — ED Notes (Addendum)
Pt's Blood sugar was 13 @ 00:12 and then 16 @ 00:13. Pt was given an AMP of D50

## 2019-10-29 NOTE — ED Notes (Signed)
Pt transferred into hospital bed from ED stretcher. Completed CBG and got 69. Pt given [place] sandwich tray and apple juice. RN notified.

## 2019-10-29 NOTE — Progress Notes (Signed)
PHARMACY -  BRIEF ANTIBIOTIC NOTE   Pharmacy has received consult(s) for Vancomycin and Cefepime from an ED provider.  The patient's profile has been reviewed for ht/wt/allergies/indication/available labs.    One time order(s) placed for Cefepime 2gm and Vancomycin 1gm.  Further antibiotics/pharmacy consults should be ordered by admitting physician if indicated.                       Thank you, Jeremiah Chapman 10/29/2019  1:19 AM

## 2019-10-29 NOTE — ED Notes (Signed)
Pt returned from CT °

## 2019-10-29 NOTE — Progress Notes (Signed)
CODE SEPSIS - PHARMACY COMMUNICATION  **Broad Spectrum Antibiotics should be administered within 1 hour of Sepsis diagnosis**  Time Code Sepsis Called/Page Received: 0106  Antibiotics Ordered: Vancomycin, Cefepime, Flagyl  Time of 1st antibiotic administration: 0129, Cefepime  Additional action taken by pharmacy: n/a  If necessary, Name of Provider/Nurse Contacted: n/a    Ena Dawley ,PharmD Clinical Pharmacist  10/29/2019  1:18 AM

## 2019-10-29 NOTE — ED Notes (Addendum)
Pt's HR has consistently stayed between 32-47bmp. EDP at bedside, and made aware. No new medication orders at this time, pt was placed on the Zoll Pads

## 2019-10-29 NOTE — Progress Notes (Signed)
PT Cancellation Note  Patient Details Name: Jeremiah Chapman MRN: [id number] DOB: February 06, 1946   Cancelled Treatment:    Reason Eval/Treat Not Completed: Medical issues which prohibited therapy(Consult received and chart reviewed.  Patient noted with uptrending troponin (2,140, pending cardiology consult.  Will hold at this time and re-attempt as medically appropriate and available.)   Katalyna Socarras H. Owens Shark, PT, DPT, NCS 10/29/19, 11:27 AM [phone]

## 2019-10-29 NOTE — Consult Note (Signed)
ANTICOAGULATION CONSULT NOTE - Initial Consult  Pharmacy Consult for Heparin Drip Indication: chest pain/ACS/NSTEMI  Allergies  Allergen Reactions  . Sitagliptin Rash and Other (See Comments)    Januvia    Patient Measurements: Height: 5\' 10"  (177.8 cm) Weight: 212 lb (96.2 kg) IBW/kg (Calculated) : 73 Heparin Dosing Weight: 92.7kg  Vital Signs: Temp: 97.8 F (36.6 C) (11/30 0700) Temp Source: Oral (11/30 0700) BP: 121/81 (11/30 1345) Pulse Rate: 110 (11/30 1345)  Labs: Recent Labs    10/29/19 0016 10/29/19 0301 10/29/19 0908  HGB 14.4 13.4  --   HCT 43.3 40.1  --   PLT 243 226  --   APTT  --  35  --   LABPROT  --  16.4*  --   INR  --  1.3*  --   CREATININE 1.50* 1.38*  --   CKTOTAL 465*  --   --   TROPONINIHS 262* 555* 2,140*    Estimated Creatinine Clearance: 53.8 mL/min (A) (by C-G formula based on SCr of 1.38 mg/dL (H)).   Medical History: Past Medical History:  Diagnosis Date  . Atrial fibrillation ([place])   . CAD (coronary artery disease)   . CHF (congestive heart failure) ([place])   . Diabetes mellitus without complication ([place])   . Gout   . Hypercholesterolemia   . Hypertension     Medications:  Pt on Eliquis PTA, but last reported dose is 11/8 @ 10 am    Assessment: Pharmacy has been consulted to initiate/monitor heparin drip in 73 yo male with suspected NSTEMI.  Will obtain baseline heparin level in addition to APTT/INR/CBC to verify (history of Eliquis).    Troponins trending 555>2140 ng/L  Goal of Therapy:  Heparin level 0.3-0.7 units/ml Monitor platelets by anticoagulation protocol: Yes   Plan:  Will initiate 4000 unit heparin bolus, followed by 1300 units/hr.    If reported last dose of Eliquis is accurate, it should have no effect on labs, and therapeutic monitoring will be based on Heparin Level.     If baseline Heparin level is elevated, it should be considered that reported last admin of Eliquis might be innacurate and Heparin  may need to be adjusted based on APTT.    Will follow heparin level in 8 hours per protocol and CBC daily.  Lu Duffel, PharmD, BCPS Clinical Pharmacist 10/29/2019 2:50 PM

## 2019-10-29 NOTE — Progress Notes (Addendum)
Progress Note    Jeremiah Chapman  ZDG:[id number] DOB: [birth year]  DOA: 10/29/2019 PCP: Dorothey Baseman, MD      Brief Narrative:    Medical records reviewed and are as summarized below:  Jeremiah Chapman is an 73 y.o. male with medical history significant for COPD, atrial fibrillation, chronic systolic CHF with EF of 40%, type 2 diabetes mellitus, obesity (BMI 30.4), CKD stage III.  He was brought to the hospital because of altered mental status/lethargy.  Apparently, he was found on his kitchen floor by a neighbor.  Reportedly, his blood glucose level was 50 when EMS arrived at the scene.  In the emergency room, his blood glucose was 13 and he was hypothermic and bradycardic.  In the ED, he also had complaints of increasing shortness of breath.      Assessment/Plan:   Principal Problem:   Hypoglycemia Active Problems:   Sepsis (HCC)   Acute on chronic systolic CHF (congestive heart failure) (HCC)   NSTEMI (non-ST elevated myocardial infarction) (HCC)   Body mass index is 30.42 kg/m.    Acute exacerbation of chronic systolic CHF: BNP was 1,511.  Discontinue IV fluids.  Treat with IV Lasix.  Monitor BMP, urine output and daily weight.  Probable acute non-ST elevation MI: Troponin  has gone up to 2,140.  Treat with IV heparin infusion.  Risks and benefits were discussed.  Continue aspirin and Lipitor.  Hold metoprolol because of bradycardia and soft blood pressure ordered 2D echo for further evaluation.  Consulted cardiologist for further recommendations.  Probable sepsis with probable pneumonia other likely etiology: Lactic acid level is down from 6.6-3.9.  Continue empiric IV antibiotics for now.  Follow-up blood cultures.  Hypoglycemia in a patient with type 2 diabetes mellitus: Improved.  Hold glimepiride, Metformin and degludec.  Patient said he was taking 130 units of degludec at home.  Hemoglobin A1c 6.5  Metabolic encephalopathy: Improved  CKD stage III:  Creatinine is stable  Chronic atrial fibrillation: Eliquis on hold  COPD: Stable     Family Communication/Anticipated D/C date and plan/Code Status   DVT prophylaxis: Heparin drip Code Status: Full code Family Communication: Plan discussed with the patient Disposition Plan: Home in 2 to 3 days depending on clinical improvement.      Subjective:   He had trouble breathing but he thinks his breathing is better.  He said he usually has a cough which is nothing new.  No chest pain, vomiting, diarrhea, or wheezing.  He said he has had intermittent swelling of his left leg for about 6 months now.  No fever or chills.  He said he takes 130 units of Lantus every day  Objective:    Vitals:   10/29/19 1345 10/29/19 1430 10/29/19 1500 10/29/19 1556  BP: 121/81 115/64 (!) 108/55 126/65  Pulse: (!) 110 81 (!) 52 (!) 58  Resp: 17 (!) 28 (!) 30 20  Temp:      TempSrc:      SpO2: 98% 95% 92% 94%  Weight:      Height:        Intake/Output Summary (Last 24 hours) at 10/29/2019 1637 Last data filed at 10/29/2019 1156 Gross per 24 hour  Intake 572.76 ml  Output 150 ml  Net 422.76 ml   Filed Weights   10/29/19 0021  Weight: 96.2 kg    Exam:  GEN: NAD SKIN: No rash EYES: EOMI, PERRLA ENT: MMM CV: RRR PULM: CTA B ABD: soft, obese,  NT, +BS CNS: AAO x 3, non focal EXT: Left leg edema without tenderness or erythema.  Right leg looks normal.  He said he has had intermittent left leg swelling for about 6 months.   Data Reviewed:   I have personally reviewed following labs and imaging studies:  Labs: Labs show the following:   Basic Metabolic Panel: Recent Labs  Lab 10/29/19 0016 10/29/19 0301  NA 138 137  K 3.6 4.7  CL 102 102  CO2 21* 21*  GLUCOSE 147* 215*  BUN 27* 30*  CREATININE 1.50* 1.38*  CALCIUM 8.8* 8.7*   GFR Estimated Creatinine Clearance: 53.8 mL/min (A) (by C-G formula based on SCr of 1.38 mg/dL (H)). Liver Function Tests: Recent Labs  Lab  10/29/19 0016 10/29/19 0301  AST 38 37  ALT 18 19  ALKPHOS 80 75  BILITOT 1.5* 1.8*  PROT 7.5 7.2  ALBUMIN 3.6 3.5   No results for input(s): LIPASE, AMYLASE in the last 168 hours. No results for input(s): AMMONIA in the last 168 hours. Coagulation profile Recent Labs  Lab 10/29/19 0301  INR 1.3*    CBC: Recent Labs  Lab 10/29/19 0016 10/29/19 0301  WBC 17.6* 14.4*  NEUTROABS  --  12.6*  HGB 14.4 13.4  HCT 43.3 40.1  MCV 93.1 92.2  PLT 243 226   Cardiac Enzymes: Recent Labs  Lab 10/29/19 0016  CKTOTAL 465*   BNP (last 3 results) No results for input(s): PROBNP in the last 8760 hours. CBG: Recent Labs  Lab 10/29/19 0131 10/29/19 0315 10/29/19 0457 10/29/19 0858 10/29/19 1358  GLUCAP 185* 185* 147* 71 69*   D-Dimer: No results for input(s): DDIMER in the last 72 hours. Hgb A1c: Recent Labs    10/29/19 0301  HGBA1C 6.5*   Lipid Profile: No results for input(s): CHOL, HDL, LDLCALC, TRIG, CHOLHDL, LDLDIRECT in the last 72 hours. Thyroid function studies: Recent Labs    10/29/19 0301  TSH 1.547   Anemia work up: No results for input(s): VITAMINB12, FOLATE, FERRITIN, TIBC, IRON, RETICCTPCT in the last 72 hours. Sepsis Labs: Recent Labs  Lab 10/29/19 0016 10/29/19 0106 10/29/19 0301  WBC 17.6*  --  14.4*  LATICACIDVEN  --  6.6* 3.9*    Microbiology Recent Results (from the past 240 hour(s))  Blood Culture (routine x 2)     Status: None (Preliminary result)   Collection Time: 10/29/19  1:06 AM   Specimen: BLOOD  Result Value Ref Range Status   Specimen Description BLOOD RIGHT ARM  Final   Special Requests   Final    BOTTLES DRAWN AEROBIC AND ANAEROBIC Blood Culture adequate volume   Culture   Final    NO GROWTH < 12 HOURS Performed at [facility], [address] [place], [place] [address]    Report Status PENDING  Incomplete  Blood Culture (routine x 2)     Status: None (Preliminary result)   Collection Time: 10/29/19   1:06 AM   Specimen: BLOOD  Result Value Ref Range Status   Specimen Description BLOOD LEFT ARM  Final   Special Requests   Final    BOTTLES DRAWN AEROBIC AND ANAEROBIC Blood Culture adequate volume   Culture   Final    NO GROWTH < 12 HOURS Performed at [facility], [place], [place] [address]    Report Status PENDING  Incomplete  SARS CORONAVIRUS 2 (TAT 6-24 HRS) Nasopharyngeal Nasopharyngeal Swab     Status: None  Collection Time: 10/29/19  1:36 AM   Specimen: Nasopharyngeal Swab  Result Value Ref Range Status   SARS Coronavirus 2 NEGATIVE NEGATIVE Final    Comment: (NOTE) SARS-CoV-2 target nucleic acids are NOT DETECTED. The SARS-CoV-2 RNA is generally detectable in upper and lower respiratory specimens during the acute phase of infection. Negative results do not preclude SARS-CoV-2 infection, do not rule out co-infections with other pathogens, and should not be used as the sole basis for treatment or other patient management decisions. Negative results must be combined with clinical observations, patient history, and epidemiological information. The expected result is Negative. Fact Sheet for Patients: HairSlick.nohttps://www.fda.gov/media/138098/download Fact Sheet for Healthcare Providers: quierodirigir.comhttps://www.fda.gov/media/138095/download This test is not yet approved or cleared by the [place] FDA and  has been authorized for detection and/or diagnosis of SARS-CoV-2 by FDA under an Emergency Use Authorization (EUA). This EUA will remain  in effect (meaning this test can be used) for the duration of the COVID-19 declaration under Section 56 4(b)(1) of the Act, 21 U.S.C. section 360bbb-3(b)(1), unless the authorization is terminated or revoked sooner. Performed at [facility], [address] [place], [place] [address]     Procedures and diagnostic studies:  Ct Head Wo Contrast  Result Date: 10/29/2019 CLINICAL DATA:  Fall EXAM: CT HEAD WITHOUT  CONTRAST TECHNIQUE: Contiguous axial images were obtained from the base of the skull through the vertex without intravenous contrast. COMPARISON:  None. FINDINGS: Brain: No evidence of acute territorial infarction, hemorrhage, hydrocephalus,extra-axial collection or mass lesion/mass effect. There is dilatation the ventricles and sulci consistent with age-related atrophy. Low-attenuation changes in the deep white matter consistent with small vessel ischemia. 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 Skull: The skull is intact. No fracture or focal lesion identified. Sinuses/Orbits: The visualized paranasal sinuses and mastoid air cells are clear. The orbits and globes intact. Other: None IMPRESSION: No acute intracranial abnormality. 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 Electronically Signed   By: Jonna ClarkBindu  Avutu M.D.   On: 10/29/2019 01:29   Ct Chest Wo Contrast  Result Date: 10/29/2019 CLINICAL DATA:  Shortness of breath EXAM: CT CHEST WITHOUT CONTRAST TECHNIQUE: Multidetector CT imaging of the chest was performed following the standard protocol without IV contrast. COMPARISON:  None. FINDINGS: Cardiovascular: The heart is enlarged. No pericardial effusion. Surgical changes from coronary artery bypass surgery are noted. Extensive three-vessel coronary artery calcifications. Moderate aortic calcifications but no aneurysm. Mediastinum/Nodes: Small scattered mediastinal and hilar lymph nodes but no mass or overt adenopathy. Lungs/Pleura: There is a moderate-size right pleural effusion with overlying atelectasis. There is also loculated pleural fluid anteriorly with overlying right middle lobe atelectasis which has the appearance of rounded atelectasis. There is also a small left pleural effusion with minimal overlying atelectasis. I do not see any  findings for pulmonary edema or worrisome pulmonary nodules. Emphysematous changes are noted. Upper Abdomen: No significant upper abdominal findings. Musculoskeletal: No chest wall mass, supraclavicular or axillary adenopathy. The thyroid gland is grossly normal. The bony structures are intact. IMPRESSION: 1. Moderate-sized right pleural effusion with overlying atelectasis. Anterior loculated component is noted with overlying probable rounded atelectasis. I would recommend a follow-up chest CT in 3 months to make sure this resolves or is at least stable. 2. No definite pulmonary mass or worrisome pulmonary nodules. 3. Small left effusion with minimal overlying atelectasis. 4. Cardiac enlargement. 5. Advanced three-vessel coronary artery  calcifications with evidence of prior coronary artery bypass surgery. Aortic Atherosclerosis (ICD10-I70.0) and Emphysema (ICD10-J43.9). Electronically Signed   By: Rudie Meyer M.D.   On: 10/29/2019 07:23   Dg Chest Portable 1 View  Result Date: 10/29/2019 CLINICAL DATA:  Hypoglycemia EXAM: PORTABLE CHEST 1 VIEW COMPARISON:  May 26, 2019 FINDINGS: The cardiomediastinal silhouette is unchanged from prior exam. Overlying median sternotomy wires. There is a small right pleural effusion, as on the prior exam. There is mild prominence of the central pulmonary vasculature. No acute osseous abnormality. IMPRESSION: Small right pleural effusion as on prior exam. Findings suggestive of mild pulmonary vascular congestion. Electronically Signed   By: Jonna Clark M.D.   On: 10/29/2019 00:37    Medications:   . aspirin EC  81 mg Oral Daily  . atorvastatin  80 mg Oral Daily  . [START ON 10/30/2019] furosemide  40 mg Intravenous Daily  . insulin aspart  0-6 Units Subcutaneous Q4H   Continuous Infusions: . ceFEPime (MAXIPIME) IV Stopped (10/29/19 1049)  . dextrose Stopped (10/29/19 0110)  . heparin 1,300 Units/hr (10/29/19 1608)  . metronidazole Stopped (10/29/19 1304)  .  [START ON 10/30/2019] vancomycin       LOS: 0 days   Shaunae Sieloff  [facility]   *Please refer to [email or web address], password [email or web address] to get updated schedule on who will round on this patient, as hospitalists switch teams weekly. If 7PM-7AM, please contact night-coverage at [email or web address], password [email or web address] for any overnight needs.  10/29/2019, 4:37 PM

## 2019-10-29 NOTE — ED Notes (Signed)
Report given to Kate B RN 

## 2019-10-29 NOTE — ED Notes (Signed)
Date and time results received: 10/29/19 12:56 AM   Test: Troponin  Critical Value: 262  Name of Provider Notified: Dr. Owens Shark

## 2019-10-29 NOTE — ED Notes (Addendum)
Pt calling out and asking for assistance with use of urinal. Pt has been unable to void since arrival to ED. Pt remains unable to void. Pt alert and calm. No increased work of breathing or acute distress noted at this time.

## 2019-10-29 NOTE — ED Notes (Signed)
Text message sent to hospitalist concerning troponin and blood glucose of 69.

## 2019-10-29 NOTE — H&P (Addendum)
History and Physical    Jeremiah Chapman ZOX:[id number] DOB: 16-May-1946 DOA: 10/29/2019  PCP: Dorothey Baseman, MD  Patient coming from: Home  I have personally briefly reviewed patient's old medical records in [facility]  Chief Complaint: change in mental status/hypoglycemia  HPI: Jeremiah Chapman is a 73 y.o. male with medical history significant of  COPD obesity and questionable history of sleep apnea atrial fibrillation CHF with EF of 40% who presents to the ED status post being found down.  Per EMS patient was found by his neighbor on his kitchen floor at the arrival of EMS patient was alert and sitting up however was noted to be confused and somewhat lethargic.  At that time patient's was evaluated his glucose was 50 there was no access at that time and patient was brought into the ED Without further treatment of his hyperglycemia.  Of note per EMS his vitals otherwise were noted to be stable.  Per patient stated that on awaking this morning he was in his  normal  state of health.  He states prior to his fall he had no prodrome no chest pain no palpitations no warning of presyncope prior to him falling.  Patient also denied fever,  Chills, nausea vomiting abdominal pain or recent URI symptoms. However he notes that over the last 2-3 weeks he has had  difficulty with increased shortness of breath.  States he has been followed by his pulmonary doctor in reference to this and was recently restarted on his Lasix for which he has been on for around a week and a half. he states however his breathing is at baseline from around 3 weeks ago.He also endorses chronic cough which has not changed in character and is not productive.   ED Course: Initial vitals patient was noted to be hypothermic with temperature 33.7, Blood pressure 98/70, heart rate 61 sats 100% Repeat glucose was 13. Patient was treated with 2 A of D50 and started on D5 NS Repeat fingersticks improved to the mid 100s No due to  patient presentation code sepsis was called patient was started on broad-spectrum antibiotics in the ED. Course further complicated by patient with complaints of increasing shortness of breath patient was then given Lasix x1 Per RN noted patient had improvements in symptoms at that time.  Please note that during the period of shortness of breath there was no documented desaturation. 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   Past Medical History:  Diagnosis Date  . Atrial fibrillation (HCC)   . CAD (coronary artery disease)   . CHF (congestive heart failure) (HCC)   . Diabetes mellitus without complication (HCC)   . Gout   . Hypercholesterolemia   . Hypertension     Past Surgical History:  Procedure Laterality Date  . CARDIAC CATHETERIZATION    . CARDIOVERSION N/A 01/20/2017   Procedure: CARDIOVERSION;  Surgeon: Marcina Millard, MD;  Location: [facility] ORS;  Service: Cardiovascular;  Laterality: N/A;  . COLONOSCOPY    . CORONARY ARTERY BYPASS GRAFT    . CORONARY STENT INTERVENTION N/A 03/28/2017   Procedure: Coronary Stent Intervention;  Surgeon: Marcina Millard, MD;  Location: [facility] INVASIVE CV LAB;  Service: Cardiovascular;  Laterality:  N/A;  . LEFT HEART CATH AND CORONARY ANGIOGRAPHY N/A 03/28/2017   Procedure: Left Heart Cath and Coronary Angiography;  Surgeon: Marcina MillardAlexander Paraschos, MD;  Location: [facility] INVASIVE CV LAB;  Service: Cardiovascular;  Laterality: N/A;  . limbar back surgery    . PENILE PROSTHESIS IMPLANT       reports that he has quit smoking. His smoking use included cigarettes. He has a 25.00 pack-year smoking history. He uses smokeless tobacco. He reports that  he does not drink alcohol or use drugs.  Allergies  Allergen Reactions  . Sitagliptin Rash and Other (See Comments)    Januvia    Family History  Problem Relation Age of Onset  . Hypertension Mother     Prior to Admission medications   Medication Sig Start Date End Date Taking? Authorizing Provider  apixaban (ELIQUIS) 5 MG TABS tablet Take 5 mg by mouth every 12 (twelve) hours.    Yes [provider]  aspirin 81 MG EC tablet Take 81 mg by mouth daily.    Yes [provider]  atorvastatin (LIPITOR) 80 MG tablet Take 80 mg by mouth daily.    Yes [provider]  digoxin (LANOXIN) 0.125 MG tablet Take 1 tablet (0.125 mg total) by mouth daily. 04/23/17  Yes Auburn BilberryPatel, Shreyang, MD  furosemide (LASIX) 20 MG tablet Take 1 tablet (20 mg total) by mouth 2 (two) times daily. 05/28/19  Yes Auburn BilberryPatel, Shreyang, MD  gabapentin (NEURONTIN) 100 MG capsule Take 100-300 mg by mouth See admin instructions. Take 1 capsule (100mg ) by mouth at dinnertime and take 3 capsules (300mg ) by mouth at bedtime   Yes [provider]  glimepiride (AMARYL) 4 MG tablet Take 4 mg by mouth daily with breakfast.    Yes [provider]  Insulin Degludec 200 UNIT/ML SOPN Inject 130 Units into the skin daily.    Yes [provider]  irbesartan (AVAPRO) 300 MG tablet Take 300 mg by mouth daily.    Yes [provider]  metFORMIN (GLUCOPHAGE-XR) 500 MG 24 hr tablet Take 1,000 mg by mouth 2 (two) times daily.    Yes [provider]  metoprolol succinate (TOPROL-XL) 25 MG 24 hr tablet Take 12.5 mg by mouth daily.    Yes [provider]  triamcinolone cream (KENALOG) 0.1 % Apply 1 application topically 2 (two) times daily as needed (skin irritations).    Yes [provider]  B-D ULTRAFINE III SHORT PEN 31G X 8 MM MISC 2 (two) times daily. 06/15/19   [provider]  glucose blood (FREESTYLE LITE) test strip USE TWICE DAILY AS DIRECTED 06/15/19    [provider]  tamsulosin (FLOMAX) 0.4 MG CAPS capsule Take 0.4 mg by mouth daily. Take after the same meal each day. 07/20/19 07/19/20  [provider]    Physical Exam: Vitals:   10/29/19 0130 10/29/19 0200 10/29/19 0304 10/29/19 0458  BP: 127/68 128/76 128/69 105/61  Pulse: 90 (!) 116 (!) 47 (!) 52  Resp: 20 (!) 22 (!) 24 (!) 25  Temp: (!) 92.4 F (33.6 C)  (!) 94.5 F (34.7 C) 97.6 F (36.4 C)  TempSrc: Rectal  Rectal Oral  SpO2: 99% 90% 100% 98%  Weight:      Height:        Constitutional: NAD, calm, comfortable Vitals:   10/29/19 0130 10/29/19 0200 10/29/19 0304 10/29/19 0458  BP: 127/68 128/76 128/69 105/61  Pulse: 90 (!) 116 (!) 47 (!) 52  Resp: 20 (!) 22 (!)  24 (!) 25  Temp: (!) 92.4 F (33.6 C)  (!) 94.5 F (34.7 C) 97.6 F (36.4 C)  TempSrc: Rectal  Rectal Oral  SpO2: 99% 90% 100% 98%  Weight:      Height:       Eyes: PERRL, lids and conjunctivae normal ENMT: Mucous membranes are moist. Posterior pharynx clear of any exudate or lesions.Normal dentition.  Neck: normal, supple, no masses, no thyromegaly Respiratory: clear to auscultation bilaterally, no wheezing, no crackles. Normal respiratory effort. No accessory muscle use.  Cardiovascular: Regular rate and rhythm, no murmurs / rubs / gallops. + 2 extremity edema. 2+ pedal pulses. Abdomen: no tenderness, no masses palpated. No hepatosplenomegaly. Bowel sounds positive.  Musculoskeletal: no clubbing / cyanosis. No joint deformity upper and lower extremities. Good ROM, no contractures. Normal muscle tone.  Skin: multiple area of eechymosis Neurologic: CN 2-12 grossly intact. Sensation intact, DTR normal. Strength 5/5 in all 4.  Psychiatric: Normal judgment and insight. Alert and oriented x 3. Normal mood.    Labs on Admission: I have personally reviewed following labs and imaging studies  CBC: Recent Labs  Lab 10/29/19 0016 10/29/19 0301  WBC 17.6* 14.4*  NEUTROABS  --  12.6*  HGB  14.4 13.4  HCT 43.3 40.1  MCV 93.1 92.2  PLT 243 161   Basic Metabolic Panel: Recent Labs  Lab 10/29/19 0016 10/29/19 0301  NA 138 137  K 3.6 4.7  CL 102 102  CO2 21* 21*  GLUCOSE 147* 215*  BUN 27* 30*  CREATININE 1.50* 1.38*  CALCIUM 8.8* 8.7*   GFR: Estimated Creatinine Clearance: 53.8 mL/min (A) (by C-G formula based on SCr of 1.38 mg/dL (H)). Liver Function Tests: Recent Labs  Lab 10/29/19 0016 10/29/19 0301  AST 38 37  ALT 18 19  ALKPHOS 80 75  BILITOT 1.5* 1.8*  PROT 7.5 7.2  ALBUMIN 3.6 3.5   No results for input(s): LIPASE, AMYLASE in the last 168 hours. No results for input(s): AMMONIA in the last 168 hours. Coagulation Profile: Recent Labs  Lab 10/29/19 0301  INR 1.3*   Cardiac Enzymes: Recent Labs  Lab 10/29/19 0016  CKTOTAL 465*   BNP (last 3 results) No results for input(s): PROBNP in the last 8760 hours. HbA1C: No results for input(s): HGBA1C in the last 72 hours. CBG: Recent Labs  Lab 10/29/19 0037 10/29/19 0102 10/29/19 0131 10/29/19 0315 10/29/19 0457  GLUCAP 157* 226* 185* 185* 147*   Lipid Profile: No results for input(s): CHOL, HDL, LDLCALC, TRIG, CHOLHDL, LDLDIRECT in the last 72 hours. Thyroid Function Tests: Recent Labs    10/29/19 0301  TSH 1.547   Anemia Panel: No results for input(s): VITAMINB12, FOLATE, FERRITIN, TIBC, IRON, RETICCTPCT in the last 72 hours. Urine analysis: No results found for: COLORURINE, APPEARANCEUR, LABSPEC, [place], GLUCOSEU, HGBUR, BILIRUBINUR, KETONESUR, PROTEINUR, UROBILINOGEN, NITRITE, LEUKOCYTESUR  Radiological Exams on Admission: Ct Head Wo Contrast  Result Date: 10/29/2019 CLINICAL DATA:  Fall EXAM: CT HEAD WITHOUT CONTRAST TECHNIQUE: Contiguous axial images were obtained from the base of the skull through the vertex without intravenous contrast. COMPARISON:  None. FINDINGS: Brain: No evidence of acute territorial infarction, hemorrhage, hydrocephalus,extra-axial collection or mass  lesion/mass effect. There is dilatation the ventricles and sulci consistent with age-related atrophy. Low-attenuation changes in the deep white matter consistent with small vessel ischemia. 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 Skull: The skull is intact. No fracture or focal lesion identified. Sinuses/Orbits: The visualized paranasal  sinuses and mastoid air cells are clear. The orbits and globes intact. Other: None IMPRESSION: No acute intracranial abnormality. 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 Electronically Signed   By: Jonna Clark M.D.   On: 10/29/2019 01:29   Dg Chest Portable 1 View  Result Date: 10/29/2019 CLINICAL DATA:  Hypoglycemia EXAM: PORTABLE CHEST 1 VIEW COMPARISON:  May 26, 2019 FINDINGS: The cardiomediastinal silhouette is unchanged from prior exam. Overlying median sternotomy wires. There is a small right pleural effusion, as on the prior exam. There is mild prominence of the central pulmonary vasculature. No acute osseous abnormality. IMPRESSION: Small right pleural effusion as on prior exam. Findings suggestive of mild pulmonary vascular congestion. Electronically Signed   By: Jonna Clark M.D.   On: 10/29/2019 00:37    EKG: Independently reviewed.earilier ekg not available for my review /per ED MD no acute changes.  Pending repeat. Assessment/Plan Active Problems:   Hypoglycemia   Sepsis [facility])   Patient is a 73 year old male past medical history significant for COPD obesity and questionable history of sleep apnea as well as atrial fibrillation CHF with EF of 40% who presents to the ED after being found down in his home.  At that time he was evaluated and found to have hypoglycemia he was then brought into the ED on reevaluation he was noted to be hypothermic and also hypoglycemic with glucose glucose of 13.   Patient was treated for his hypothermia and hypoglycemia.  Of note he also had the markers for code sepsis and this was instituted.  Further evaluation he was noted to have elevated lactic acid 6.6 in addition to elevated high-sensitivity troponin to 500s as well as elevated BNP to 1200 from his baseline.  His initial chest x-ray and noted questionable congestion.  In the ED he was treated with Lasix due to an episode of shortness of breath without acute desaturation.  Patient now is admitted with concerns for sepsis due to unknown source possibly pulmonary as well as concern for  Type 2 NSTEMI     Sepsis sepsis unknown source possible pulmonary -Continue on sepsis protocol and broad-spectrum antibiotics, descalate as able -CT thorax of the chest for further evaluation -Critical care consult requested  Hypoglycemia/DM type II -Status post treatment which improved -Sticks every 4 -Check hemoglobin A1c -Hold hypoglycemic agents  Hypothermia -Improved, likely related to sepsis  Atrial fibrillation -Continue on apixaban -Hold metoprolol currently due to relative hypotension resume as blood pressure tolerates  Type II NSTEMI -Elevated troponin most likely related to demand ischemia from related to sepsis possible. -Check lipid panel, ASA, and statin -Continue to cycle troponins -Cardiology consult  -repeat EKG pending  CHF with EF of 40% -Question mild exacerbation - post Lasix in the ED -We will hold off on further treatment with Lasix at this time - will use prn lasix bolus due to relative lower blood pressure  -cardiology to assist with further diueresis -Echo in the morning -Hold digoxin due to lower heart rate will check dig level  History of COPD -No current exacerbation at this time -As needed nebs  F EN Gentle IVFs , bolus as needed hypotension Electrolytes stable replete as needed   DVT prophylaxis: Apixaban Code Status: Full Family Communication: NA  Disposition  Plan:  Step down  Consults called:  Cardiology/crtical care  Admission status: Inpatient Lurline Del MD [facility] Pager [phone] If 7PM-7AM, please contact night-coverage [email or web address] Password  [email or web address]  10/29/2019, 5:39 AM

## 2019-10-29 NOTE — ED Notes (Signed)
Pt resting on stretcher with lights off to enhance rest. Repositioned pt for comfort and provided for safety. No increased work of breathing or acute distress noted at time. Will continue to assess.

## 2019-10-29 NOTE — ED Notes (Addendum)
Date and time results received: 10/29/19 1:51 AM  Test: Lactic  Critical Value: 6.6  Name of Provider Notified: Dr. Owens Shark

## 2019-10-29 NOTE — ED Notes (Signed)
Patient given dinner tray. Patient requests his Gabapentin for "burning in my feet."

## 2019-10-29 NOTE — ED Notes (Signed)
Heparin infusing through Left AC IV site.

## 2019-10-29 NOTE — ED Notes (Signed)
ED TO INPATIENT HANDOFF REPORT  ED Nurse Name and Phone #: Betti Cruz Name/Age/Gender Jeremiah Chapman 73 y.o. male Room/Bed: ED01A/ED01A  Code Status   Code Status: Full Code  Home/SNF/Other Home Patient oriented to: self, place, time and situation Is this baseline? Yes   Triage Complete: Triage complete  Chief Complaint [facility] - Fall  Triage Note Pt to ED via EMS for a fall. Upon arrival EMS noted pt on the floor sitting and disoriented. After checking, pt's blood glucose was 50. Pt lives alone at home and is a diabetic. Pt answering questions appropriately but is sleepy. MD at bedside    Allergies Allergies  Allergen Reactions  . Sitagliptin Rash and Other (See Comments)    Januvia    Level of Care/Admitting Diagnosis ED Disposition    ED Disposition Condition Comment   Admit  Hospital Area: [facility] [[id number]]  Level of Care: Stepdown [14]  Covid Evaluation: Asymptomatic Screening Protocol (No Symptoms)  Diagnosis: Sepsis [facility]) [[id number]]  Admitting Physician: Lurline Del [[id number]]  Attending Physician: Lurline Del [[id number]]  Estimated length of stay: past midnight tomorrow  Certification:: I certify this patient will need inpatient services for at least 2 midnights  PT Class (Do Not Modify): Inpatient [101]  PT Acc Code (Do Not Modify): Private [1]       B Medical/Surgery History Past Medical History:  Diagnosis Date  . Atrial fibrillation (HCC)   . CAD (coronary artery disease)   . CHF (congestive heart failure) (HCC)   . Diabetes mellitus without complication (HCC)   . Gout   . Hypercholesterolemia   . Hypertension    Past Surgical History:  Procedure Laterality Date  . CARDIAC CATHETERIZATION    . CARDIOVERSION N/A 01/20/2017   Procedure: CARDIOVERSION;  Surgeon: Marcina Millard, MD;  Location: [facility] ORS;  Service: Cardiovascular;  Laterality: N/A;  . COLONOSCOPY    . CORONARY ARTERY BYPASS GRAFT    .  CORONARY STENT INTERVENTION N/A 03/28/2017   Procedure: Coronary Stent Intervention;  Surgeon: Marcina Millard, MD;  Location: [facility] INVASIVE CV LAB;  Service: Cardiovascular;  Laterality: N/A;  . LEFT HEART CATH AND CORONARY ANGIOGRAPHY N/A 03/28/2017   Procedure: Left Heart Cath and Coronary Angiography;  Surgeon: Marcina Millard, MD;  Location: [facility] INVASIVE CV LAB;  Service: Cardiovascular;  Laterality: N/A;  . limbar back surgery    . PENILE PROSTHESIS IMPLANT       A IV Location/Drains/Wounds Patient Lines/Drains/Airways Status   Active Line/Drains/Airways    Name:   Placement date:   Placement time:   Site:   Days:   Peripheral IV 10/29/19 Left Arm   10/29/19    0033    Arm   less than 1   Peripheral IV 10/29/19 Left;Anterior Forearm   10/29/19    1713    Forearm   less than 1          Intake/Output Last 24 hours  Intake/Output Summary (Last 24 hours) at 10/29/2019 2100 Last data filed at 10/29/2019 1946 Gross per 24 hour  Intake 572.76 ml  Output 250 ml  Net 322.76 ml    Labs/Imaging Results for orders placed or performed during the hospital encounter of 10/29/19 (from the past 48 hour(s))  Glucose, capillary     Status: Abnormal   Collection Time: 10/29/19 12:10 AM  Result Value Ref Range   Glucose-Capillary 16 (LL) 70 - 99 mg/dL   Comment 1 Notify RN   Glucose,  capillary     Status: Abnormal   Collection Time: 10/29/19 12:11 AM  Result Value Ref Range   Glucose-Capillary 13 (LL) 70 - 99 mg/dL   Comment 1 Notify RN   CBC     Status: Abnormal   Collection Time: 10/29/19 12:16 AM  Result Value Ref Range   WBC 17.6 (H) 4.0 - 10.5 K/uL   RBC 4.65 4.22 - 5.81 MIL/uL   Hemoglobin 14.4 13.0 - 17.0 g/dL   HCT 16.1 09.6 - 04.5 %   MCV 93.1 80.0 - 100.0 fL   MCH 31.0 26.0 - 34.0 pg   MCHC 33.3 30.0 - 36.0 g/dL   RDW 40.9 81.1 - 91.4 %   Platelets 243 150 - 400 K/uL   nRBC 0.0 0.0 - 0.2 %    Comment: Performed at [facility], [address]  [place], [place] [address]  Comprehensive metabolic panel     Status: Abnormal   Collection Time: 10/29/19 12:16 AM  Result Value Ref Range   Sodium 138 135 - 145 mmol/L   Potassium 3.6 3.5 - 5.1 mmol/L   Chloride 102 98 - 111 mmol/L   CO2 21 (L) 22 - 32 mmol/L   Glucose, Bld 147 (H) 70 - 99 mg/dL   BUN 27 (H) 8 - 23 mg/dL   Creatinine, Ser 6.21 (H) 0.61 - 1.24 mg/dL   Calcium 8.8 (L) 8.9 - 10.3 mg/dL   Total Protein 7.5 6.5 - 8.1 g/dL   Albumin 3.6 3.5 - 5.0 g/dL   AST 38 15 - 41 U/L   ALT 18 0 - 44 U/L   Alkaline Phosphatase 80 38 - 126 U/L   Total Bilirubin 1.5 (H) 0.3 - 1.2 mg/dL   GFR calc non Af Amer 45 (L) >60 mL/min   GFR calc Af Amer 52 (L) >60 mL/min   Anion gap 15 5 - 15    Comment: Performed at [facility], [address] [place], [place] [address]  CK     Status: Abnormal   Collection Time: 10/29/19 12:16 AM  Result Value Ref Range   Total CK 465 (H) 49 - 397 U/L    Comment: Performed at [facility], [address] [place], [place] [address]  Troponin I (High Sensitivity)     Status: Abnormal   Collection Time: 10/29/19 12:16 AM  Result Value Ref Range   Troponin I (High Sensitivity) 262 (HH) <18 ng/L    Comment: CRITICAL RESULT CALLED TO, READ BACK BY AND VERIFIED WITH SARAH MCCLENDON ON 10/29/19 AT 0053 [facility] (NOTE) Elevated high sensitivity troponin I (hsTnI) values and significant  changes across serial measurements may suggest ACS but many other  chronic and acute conditions are known to elevate hsTnI results.  Refer to the "Links" section for chest pain algorithms and additional  guidance. Performed at [facility], [address] [place], [place] [address]   Glucose, capillary     Status: Abnormal   Collection Time: 10/29/19 12:19 AM  Result Value Ref Range   Glucose-Capillary 54 (L) 70 - 99 mg/dL  Glucose, capillary     Status: Abnormal   Collection Time: 10/29/19 12:37 AM  Result Value Ref Range   Glucose-Capillary 157  (H) 70 - 99 mg/dL   Comment 1 Document in Chart   POC SARS Coronavirus 2 Ag     Status: None   Collection Time: 10/29/19 12:41 AM  Result Value Ref Range   SARS Coronavirus 2  Ag NEGATIVE NEGATIVE    Comment: (NOTE) SARS-CoV-2 antigen NOT DETECTED.  Negative results are presumptive.  Negative results do not preclude SARS-CoV-2 infection and should not be used as the sole basis for treatment or other patient management decisions, including infection  control decisions, particularly in the presence of clinical signs and  symptoms consistent with COVID-19, or in those who have been in contact with the virus.  Negative results must be combined with clinical observations, patient history, and epidemiological information. The expected result is Negative. Fact Sheet for Patients: https://sanders-williams.net/ Fact Sheet for Healthcare Providers: https://martinez.com/ This test is not yet approved or cleared by the [place] FDA and  has been authorized for detection and/or diagnosis of SARS-CoV-2 by FDA under an Emergency Use Authorization (EUA).  This EUA will remain in effect (meaning this test can be used) for the duration of  the COVID-19 de claration under Section 564(b)(1) of the Act, 21 U.S.C. section 360bbb-3(b)(1), unless the authorization is terminated or revoked sooner.   Glucose, capillary     Status: Abnormal   Collection Time: 10/29/19  1:02 AM  Result Value Ref Range   Glucose-Capillary 226 (H) 70 - 99 mg/dL   Comment 1 Document in Chart   Lactic acid, plasma     Status: Abnormal   Collection Time: 10/29/19  1:06 AM  Result Value Ref Range   Lactic Acid, Venous 6.6 (HH) 0.5 - 1.9 mmol/L    Comment: CRITICAL RESULT CALLED TO, READ BACK BY AND VERIFIED WITH SARAH [facility] ON 10/29/19 AT 0150 [facility] Performed at [facility], [address] [place], [place] [address]   Blood Culture (routine x 2)     Status: None (Preliminary  result)   Collection Time: 10/29/19  1:06 AM   Specimen: BLOOD  Result Value Ref Range   Specimen Description BLOOD RIGHT ARM    Special Requests      BOTTLES DRAWN AEROBIC AND ANAEROBIC Blood Culture adequate volume   Culture      NO GROWTH < 12 HOURS Performed at [facility], [address] [place], [place] [address]    Report Status PENDING   Blood Culture (routine x 2)     Status: None (Preliminary result)   Collection Time: 10/29/19  1:06 AM   Specimen: BLOOD  Result Value Ref Range   Specimen Description BLOOD LEFT ARM    Special Requests      BOTTLES DRAWN AEROBIC AND ANAEROBIC Blood Culture adequate volume   Culture      NO GROWTH < 12 HOURS Performed at [facility], [address], [place], [place] [address]    Report Status PENDING   Glucose, capillary     Status: Abnormal   Collection Time: 10/29/19  1:31 AM  Result Value Ref Range   Glucose-Capillary 185 (H) 70 - 99 mg/dL   Comment 1 Document in Chart   SARS CORONAVIRUS 2 (TAT 6-24 HRS) Nasopharyngeal Nasopharyngeal Swab     Status: None   Collection Time: 10/29/19  1:36 AM   Specimen: Nasopharyngeal Swab  Result Value Ref Range   SARS Coronavirus 2 NEGATIVE NEGATIVE    Comment: (NOTE) SARS-CoV-2 target nucleic acids are NOT DETECTED. The SARS-CoV-2 RNA is generally detectable in upper and lower respiratory specimens during the acute phase of infection. Negative results do not preclude SARS-CoV-2 infection, do not rule out co-infections with other pathogens, and should not be used as the sole basis for treatment or other patient management decisions.  Negative results must be combined with clinical observations, patient history, and epidemiological information. The expected result is Negative. Fact Sheet for Patients: HairSlick.nohttps://www.fda.gov/media/138098/download Fact Sheet for Healthcare Providers: quierodirigir.comhttps://www.fda.gov/media/138095/download This test is not yet approved or cleared by the  [place] FDA and  has been authorized for detection and/or diagnosis of SARS-CoV-2 by FDA under an Emergency Use Authorization (EUA). This EUA will remain  in effect (meaning this test can be used) for the duration of the COVID-19 declaration under Section 56 4(b)(1) of the Act, 21 U.S.C. section 360bbb-3(b)(1), unless the authorization is terminated or revoked sooner. Performed at [facility], [address] [place], [place] [address]   Lactic acid, plasma     Status: Abnormal   Collection Time: 10/29/19  3:01 AM  Result Value Ref Range   Lactic Acid, Venous 3.9 (HH) 0.5 - 1.9 mmol/L    Comment: CRITICAL VALUE NOTED. VALUE IS CONSISTENT WITH PREVIOUSLY REPORTED/CALLED VALUE [facility] Performed at [facility], [address] [place], [place] [address]   Troponin I (High Sensitivity)     Status: Abnormal   Collection Time: 10/29/19  3:01 AM  Result Value Ref Range   Troponin I (High Sensitivity) 555 (HH) <18 ng/L    Comment: CRITICAL VALUE NOTED. VALUE IS CONSISTENT WITH PREVIOUSLY REPORTED/CALLED VALUE [facility] (NOTE) Elevated high sensitivity troponin I (hsTnI) values and significant  changes across serial measurements may suggest ACS but many other  chronic and acute conditions are known to elevate hsTnI results.  Refer to the "Links" section for chest pain algorithms and additional  guidance. Performed at [facility], [address] [place], [place] [address]   Hemoglobin A1c     Status: Abnormal   Collection Time: 10/29/19  3:01 AM  Result Value Ref Range   Hgb A1c MFr Bld 6.5 (H) 4.8 - 5.6 %    Comment: (NOTE) Pre diabetes:          5.7%-6.4% Diabetes:              >6.4% Glycemic control for   <7.0% adults with diabetes    Mean Plasma Glucose 139.85 mg/dL    Comment: Performed at [facility], [address] [place], [place] [address]  CBC with Differential     Status: Abnormal   Collection Time: 10/29/19  3:01 AM  Result Value Ref  Range   WBC 14.4 (H) 4.0 - 10.5 K/uL   RBC 4.35 4.22 - 5.81 MIL/uL   Hemoglobin 13.4 13.0 - 17.0 g/dL   HCT 29.540.1 62.139.0 - 30.852.0 %   MCV 92.2 80.0 - 100.0 fL   MCH 30.8 26.0 - 34.0 pg   MCHC 33.4 30.0 - 36.0 g/dL   RDW 65.713.8 84.611.5 - 96.215.5 %   Platelets 226 150 - 400 K/uL   nRBC 0.0 0.0 - 0.2 %   Neutrophils Relative % 87 %   Neutro Abs 12.6 (H) 1.7 - 7.7 K/uL   Lymphocytes Relative 7 %   Lymphs Abs 1.0 0.7 - 4.0 K/uL   Monocytes Relative 5 %   Monocytes Absolute 0.7 0.1 - 1.0 K/uL   Eosinophils Relative 0 %   Eosinophils Absolute 0.0 0.0 - 0.5 K/uL   Basophils Relative 0 %   Basophils Absolute 0.0 0.0 - 0.1 K/uL   Immature Granulocytes 1 %   Abs Immature Granulocytes 0.09 (H) 0.00 - 0.07 K/uL    Comment: Performed at [facility], [address] [place], [place] [address]  Comprehensive metabolic panel     Status: Abnormal   Collection Time: 10/29/19  3:01 AM  Result Value Ref Range   Sodium 137 135 - 145 mmol/L   Potassium 4.7 3.5 - 5.1 mmol/L   Chloride 102 98 - 111 mmol/L   CO2 21 (L) 22 - 32 mmol/L   Glucose, Bld 215 (H) 70 - 99 mg/dL   BUN 30 (H) 8 - 23 mg/dL   Creatinine, Ser 0.98 (H) 0.61 - 1.24 mg/dL   Calcium 8.7 (L) 8.9 - 10.3 mg/dL   Total Protein 7.2 6.5 - 8.1 g/dL   Albumin 3.5 3.5 - 5.0 g/dL   AST 37 15 - 41 U/L   ALT 19 0 - 44 U/L   Alkaline Phosphatase 75 38 - 126 U/L   Total Bilirubin 1.8 (H) 0.3 - 1.2 mg/dL   GFR calc non Af Amer 50 (L) >60 mL/min   GFR calc Af Amer 58 (L) >60 mL/min   Anion gap 14 5 - 15    Comment: Performed at [facility], [address] [place], [place] [address]  Protime-INR     Status: Abnormal   Collection Time: 10/29/19  3:01 AM  Result Value Ref Range   Prothrombin Time 16.4 (H) 11.4 - 15.2 seconds   INR 1.3 (H) 0.8 - 1.2    Comment: (NOTE) INR goal varies based on device and disease states. Performed at [facility], [address] [place], [place] [address]   APTT     Status: None    Collection Time: 10/29/19  3:01 AM  Result Value Ref Range   aPTT 35 24 - 36 seconds    Comment: Performed at [facility], [address] [place], [place] [address]  TSH     Status: None   Collection Time: 10/29/19  3:01 AM  Result Value Ref Range   TSH 1.547 0.350 - 4.500 uIU/mL    Comment: Performed by a 3rd Generation assay with a functional sensitivity of <=0.01 uIU/mL. Performed at [facility], [address] [place], [place] [address]   Brain natriuretic peptide     Status: Abnormal   Collection Time: 10/29/19  3:01 AM  Result Value Ref Range   B Natriuretic Peptide 1,511.0 (H) 0.0 - 100.0 pg/mL    Comment: Performed at [facility], [address] [place], [place] [address]  Glucose, capillary     Status: Abnormal   Collection Time: 10/29/19  3:15 AM  Result Value Ref Range   Glucose-Capillary 185 (H) 70 - 99 mg/dL  Glucose, capillary     Status: Abnormal   Collection Time: 10/29/19  4:57 AM  Result Value Ref Range   Glucose-Capillary 147 (H) 70 - 99 mg/dL  Blood gas, arterial     Status: None   Collection Time: 10/29/19  5:36 AM  Result Value Ref Range   FIO2 28.00    pH, Arterial 7.41 7.350 - 7.450   pCO2 arterial 38 32.0 - 48.0 mmHg   pO2, Arterial 102 83.0 - 108.0 mmHg   Bicarbonate 24.1 20.0 - 28.0 mmol/L   Acid-base deficit 0.3 0.0 - 2.0 mmol/L   O2 Saturation 97.9 %   Patient temperature 37.0    Collection site RIGHT RADIAL    Sample type ARTERIAL DRAW    Allens test (pass/fail) PASS PASS    Comment: Performed at [facility], [address] [place], [place] [address]  Urinalysis, Routine w reflex microscopic  Status: Abnormal   Collection Time: 10/29/19  7:04 AM  Result Value Ref Range   Color, Urine YELLOW (A) YELLOW   APPearance HAZY (A) CLEAR   Specific Gravity, Urine 1.017 1.005 - 1.030   pH 5.0 5.0 - 8.0   Glucose, UA >=500 (A) NEGATIVE mg/dL   Hgb urine dipstick MODERATE (A) NEGATIVE   Bilirubin  Urine NEGATIVE NEGATIVE   Ketones, ur NEGATIVE NEGATIVE mg/dL   Protein, ur >=409 (A) NEGATIVE mg/dL   Nitrite NEGATIVE NEGATIVE   Leukocytes,Ua NEGATIVE NEGATIVE   RBC / HPF 0-5 0 - 5 RBC/hpf   WBC, UA 0-5 0 - 5 WBC/hpf   Bacteria, UA NONE SEEN NONE SEEN   Squamous Epithelial / LPF 0-5 0 - 5    Comment: Performed at [facility], [address] [place], [place] [address]  Urine Drug Screen, Qualitative ([facility] only)     Status: None   Collection Time: 10/29/19  7:04 AM  Result Value Ref Range   Tricyclic, Ur Screen NONE DETECTED NONE DETECTED   Amphetamines, Ur Screen NONE DETECTED NONE DETECTED   MDMA (Ecstasy)Ur Screen NONE DETECTED NONE DETECTED   Cocaine Metabolite,Ur [place] NONE DETECTED NONE DETECTED   Opiate, Ur Screen NONE DETECTED NONE DETECTED   Phencyclidine (PCP) Ur S NONE DETECTED NONE DETECTED   Cannabinoid 50 Ng, Ur [place] NONE DETECTED NONE DETECTED   Barbiturates, Ur Screen NONE DETECTED NONE DETECTED   Benzodiazepine, Ur Scrn NONE DETECTED NONE DETECTED   Methadone Scn, Ur NONE DETECTED NONE DETECTED    Comment: (NOTE) Tricyclics + metabolites, urine    Cutoff 1000 ng/mL Amphetamines + metabolites, urine  Cutoff 1000 ng/mL MDMA (Ecstasy), urine              Cutoff 500 ng/mL Cocaine Metabolite, urine          Cutoff 300 ng/mL Opiate + metabolites, urine        Cutoff 300 ng/mL Phencyclidine (PCP), urine         Cutoff 25 ng/mL Cannabinoid, urine                 Cutoff 50 ng/mL Barbiturates + metabolites, urine  Cutoff 200 ng/mL Benzodiazepine, urine              Cutoff 200 ng/mL Methadone, urine                   Cutoff 300 ng/mL The urine drug screen provides only a preliminary, unconfirmed analytical test result and should not be used for non-medical purposes. Clinical consideration and professional judgment should be applied to any positive drug screen result due to possible interfering substances. A more specific alternate chemical method must be used in  order to obtain a confirmed analytical result. Gas chromatography / mass spectrometry (GC/MS) is the preferred confirmat ory method. Performed at [facility], [address] [place], [place] [address]   Glucose, capillary     Status: None   Collection Time: 10/29/19  8:58 AM  Result Value Ref Range   Glucose-Capillary 71 70 - 99 mg/dL  Troponin I (High Sensitivity)     Status: Abnormal   Collection Time: 10/29/19  9:08 AM  Result Value Ref Range   Troponin I (High Sensitivity) 2,140 (HH) <18 ng/L    Comment: CRITICAL VALUE NOTED. VALUE IS CONSISTENT WITH PREVIOUSLY REPORTED/CALLED VALUE DAS (NOTE) Elevated high sensitivity troponin I (hsTnI) values and significant  changes across serial measurements may suggest ACS but many  other  chronic and acute conditions are known to elevate hsTnI results.  Refer to the "Links" section for chest pain algorithms and additional  guidance. Performed at [facility], [address] [place], [place] [address]   Glucose, capillary     Status: Abnormal   Collection Time: 10/29/19  1:58 PM  Result Value Ref Range   Glucose-Capillary 69 (L) 70 - 99 mg/dL  Glucose, capillary     Status: None   Collection Time: 10/29/19  5:01 PM  Result Value Ref Range   Glucose-Capillary 85 70 - 99 mg/dL   Comment 1 Notify RN    Comment 2 Document in Chart   Glucose, capillary     Status: Abnormal   Collection Time: 10/29/19  8:17 PM  Result Value Ref Range   Glucose-Capillary 111 (H) 70 - 99 mg/dL   Ct Head Wo Contrast  Result Date: 10/29/2019 CLINICAL DATA:  Fall EXAM: CT HEAD WITHOUT CONTRAST TECHNIQUE: Contiguous axial images were obtained from the base of the skull through the vertex without intravenous contrast. COMPARISON:  None. FINDINGS: Brain: No evidence of acute territorial infarction, hemorrhage, hydrocephalus,extra-axial collection or mass lesion/mass effect. There is dilatation the ventricles and sulci consistent with  age-related atrophy. Low-attenuation changes in the deep white matter consistent with small vessel ischemia. 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 Skull: The skull is intact. No fracture or focal lesion identified. Sinuses/Orbits: The visualized paranasal sinuses and mastoid air cells are clear. The orbits and globes intact. Other: None IMPRESSION: No acute intracranial abnormality. 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 Electronically Signed   By: Jonna Clark M.D.   On: 10/29/2019 01:29   Ct Chest Wo Contrast  Result Date: 10/29/2019 CLINICAL DATA:  Shortness of breath EXAM: CT CHEST WITHOUT CONTRAST TECHNIQUE: Multidetector CT imaging of the chest was performed following the standard protocol without IV contrast. COMPARISON:  None. FINDINGS: Cardiovascular: The heart is enlarged. No pericardial effusion. Surgical changes from coronary artery bypass surgery are noted. Extensive three-vessel coronary artery calcifications. Moderate aortic calcifications but no aneurysm. Mediastinum/Nodes: Small scattered mediastinal and hilar lymph nodes but no mass or overt adenopathy. Lungs/Pleura: There is a moderate-size right pleural effusion with overlying atelectasis. There is also loculated pleural fluid anteriorly with overlying right middle lobe atelectasis which has the appearance of rounded atelectasis. There is also a small left pleural effusion with minimal overlying atelectasis. I do not see any findings for pulmonary edema or worrisome pulmonary nodules. Emphysematous changes are noted. Upper Abdomen: No significant upper abdominal findings. Musculoskeletal: No chest wall mass, supraclavicular or axillary adenopathy. The thyroid gland is grossly normal. The bony structures are intact. IMPRESSION: 1. Moderate-sized right pleural effusion with  overlying atelectasis. Anterior loculated component is noted with overlying probable rounded atelectasis. I would recommend a follow-up chest CT in 3 months to make sure this resolves or is at least stable. 2. No definite pulmonary mass or worrisome pulmonary nodules. 3. Small left effusion with minimal overlying atelectasis. 4. Cardiac enlargement. 5. 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 Aortic Atherosclerosis (ICD10-I70.0) and Emphysema (ICD10-J43.9). Electronically Signed   By: Rudie Meyer M.D.   On: 10/29/2019 07:23   Dg Chest Portable 1 View  Result Date: 10/29/2019 CLINICAL DATA:  Hypoglycemia EXAM: PORTABLE CHEST 1 VIEW COMPARISON:  May 26, 2019 FINDINGS: The cardiomediastinal silhouette is unchanged  from prior exam. Overlying median sternotomy wires. There is a small right pleural effusion, as on the prior exam. There is mild prominence of the central pulmonary vasculature. No acute osseous abnormality. IMPRESSION: Small right pleural effusion as on prior exam. Findings suggestive of mild pulmonary vascular congestion. Electronically Signed   By: Jonna Clark M.D.   On: 10/29/2019 00:37    Pending Labs Unresulted Labs (From admission, onward)    Start     Ordered   10/30/19 0500  Basic metabolic panel  Tomorrow morning,   STAT     10/29/19 0758   10/30/19 0500  CBC  Tomorrow morning,   STAT     10/29/19 1452   10/29/19 2300  Heparin level (unfractionated)  Once-Timed,   STAT     10/29/19 1452   10/29/19 1445  Heparin level (unfractionated)  ONCE - STAT,   STAT     10/29/19 1444   10/29/19 0321  Brain natriuretic peptide  Once,   STAT     10/29/19 0321   10/29/19 0318  Digoxin level  Once,   STAT     10/29/19 0321   10/29/19 0235  CBC  (heparin)  Once,   STAT    Comments: Baseline for heparin therapy IF NOT ALREADY DRAWN.  Notify MD if PLT < 100 K.    10/29/19 0246   10/29/19 0234  Culture, blood (x 2)  BLOOD  CULTURE X 2,   STAT    Comments: INITIATE ANTIBIOTICS WITHIN 1 HOUR AFTER BLOOD CULTURES DRAWN.  If unable to obtain blood cultures, call MD immediately regarding antibiotic instructions.    10/29/19 0246   10/29/19 0234  Lactic acid, plasma  STAT Now then every 2 hours,   STAT     10/29/19 0246   10/29/19 0105  Urine culture  ONCE - STAT,   STAT     10/29/19 0104   10/29/19 0016  Urinalysis, Complete w Microscopic  Once,   STAT     10/29/19 0016          Vitals/Pain Today's Vitals   10/29/19 1845 10/29/19 1915 10/29/19 1945 10/29/19 2030  BP: (!) 130/98 128/78 111/85 123/67  Pulse: 67 70 66 60  Resp: (!) 23 (!) 36  (!) 28  Temp:      TempSrc:      SpO2: 95% 97% 98% 94%  Weight:      Height:      PainSc:        Isolation Precautions No active isolations  Medications Medications  dextrose 10 % infusion ( Intravenous Not Given 10/29/19 0520)  insulin aspart (novoLOG) injection 0-6 Units (0 Units Subcutaneous Not Given 10/29/19 2024)  acetaminophen (TYLENOL) tablet 650 mg (has no administration in time range)    Or  acetaminophen (TYLENOL) suppository 650 mg (has no administration in time range)  ondansetron (ZOFRAN) tablet 4 mg ( Oral See Alternative 10/29/19 0544)    Or  ondansetron (ZOFRAN) injection 4 mg (4 mg Intravenous Given 10/29/19 0544)  albuterol (PROVENTIL) (2.5 MG/3ML) 0.083% nebulizer solution 2.5 mg (has no administration in time range)  metroNIDAZOLE (FLAGYL) IVPB 500 mg (0 mg Intravenous Stopped 10/29/19 2059)  vancomycin (VANCOCIN) IVPB 1000 mg/200 mL premix (has no administration in time range)  aspirin EC tablet 81 mg (81 mg Oral Given 10/29/19 1010)  atorvastatin (LIPITOR) tablet 80 mg (80 mg Oral Given 10/29/19 1010)  ceFEPIme (MAXIPIME) 2 g in sodium chloride 0.9 % 100 mL IVPB ( Intravenous  Stopped 10/29/19 1049)  heparin bolus via infusion 4,000 Units (4,000 Units Intravenous Bolus from Bag 10/29/19 1605)    Followed by  heparin ADULT infusion  100 units/mL (25000 units/280mL sodium chloride 0.45%) (1,300 Units/hr Intravenous New Bag/Given 10/29/19 1608)  furosemide (LASIX) injection 40 mg (has no administration in time range)  sodium chloride flush (NS) 0.9 % injection 3 mL (has no administration in time range)  gabapentin (NEURONTIN) capsule 300 mg (300 mg Oral Given 10/29/19 1803)  dextrose 50 % solution (50 mLs  Given 10/29/19 0028)  dextrose 50 % solution (50 mLs  Given 10/29/19 0010)  ceFEPIme (MAXIPIME) 2 g in sodium chloride 0.9 % 100 mL IVPB (0 g Intravenous Stopped 10/29/19 0204)  vancomycin (VANCOCIN) IVPB 1000 mg/200 mL premix (0 mg Intravenous Stopped 10/29/19 0307)  vancomycin (VANCOCIN) IVPB 1000 mg/200 mL premix (0 mg Intravenous Stopped 10/29/19 1156)  furosemide (LASIX) injection 40 mg (40 mg Intravenous Given 10/29/19 0258)    Mobility walks with person assist Moderate fall risk   Focused Assessments    R Recommendations: See Admitting Provider Note  Report given to:   Additional Notes:

## 2019-10-29 NOTE — ED Provider Notes (Signed)
[facility] Emergency Department Provider Note    First MD Initiated Contact with Patient 10/29/19 0018     (approximate)  I have reviewed the triage vital signs and the nursing notes.   HISTORY  Chief Complaint Fall and Hypoglycemia    HPI Jeremiah Chapman is a 73 y.o. male with below list of previous medical conditions presents to the emergency department via EMS secondary to call for a fall per EMS.  Upon EMS arrival patient was on the floor with noted confusion.  EMS stated that the patient's glucose was 52.  EMS staff was unable to obtain IV access no glucose was given in route to the emergency department.  EMS states that the patient's neighbor informed them that the patient has had progressive weakness over the past few days.  Patient alert and oriented on arrival to the emergency department however somnolent.  Glucose obtained was 13       Past Medical History:  Diagnosis Date  . Atrial fibrillation ([place])   . CAD (coronary artery disease)   . CHF (congestive heart failure) ([place])   . Diabetes mellitus without complication ([place])   . Gout   . Hypercholesterolemia   . Hypertension     Patient Active Problem List   Diagnosis Date Noted  . Acute on chronic diastolic CHF (congestive heart failure) ([place]) 05/26/2019  . PNA (pneumonia) 04/21/2017  . S/P cardiac catheterization 04/05/2017  . Bradycardia 03/26/2017  . Elevated troponin 03/26/2017  . AKI (acute kidney injury) ([place]) 03/26/2017  . SOB (shortness of breath) 04/15/2016  . CAD (coronary artery disease) 10/21/2015  . Hyperlipidemia 02/12/2015  . Paresthesia of foot, bilateral 02/12/2015  . Hyperlipidemia due to type 2 diabetes mellitus ([place]) 02/12/2015  . H/O adenomatous polyp of colon 01/08/2015  . Essential hypertension 10/14/2014  . Type 2 diabetes mellitus without complication ([place]) 74/25/9563  . 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 Atrial fibrillation ([place]) 06/07/2014  . Gout 06/07/2014    Past Surgical History:  Procedure Laterality Date  . CARDIAC CATHETERIZATION    . CARDIOVERSION N/A 01/20/2017   Procedure: CARDIOVERSION;  Surgeon: Isaias Cowman, MD;  Location: [facility] ORS;  Service: Cardiovascular;  Laterality: N/A;  . COLONOSCOPY    . CORONARY ARTERY BYPASS GRAFT    . CORONARY STENT INTERVENTION N/A 03/28/2017   Procedure: Coronary Stent Intervention;  Surgeon: Isaias Cowman, MD;  Location: [place] CV LAB;  Service: Cardiovascular;  Laterality: N/A;  . LEFT HEART CATH AND CORONARY ANGIOGRAPHY N/A 03/28/2017   Procedure: Left Heart Cath and Coronary Angiography;  Surgeon: Isaias Cowman, MD;  Location: [place] CV LAB;  Service: Cardiovascular;  Laterality: N/A;  . limbar back surgery    . PENILE PROSTHESIS IMPLANT      Prior to Admission medications   Medication Sig Start Date End Date Taking? Authorizing Provider  apixaban (ELIQUIS) 5 MG TABS tablet Take 5 mg by mouth every 12 (twelve) hours.     [provider]  aspirin 81 MG EC tablet Take 81 mg by mouth daily.     [provider]  atorvastatin (LIPITOR) 80 MG tablet Take 80 mg by mouth daily.     [provider]  B-D ULTRAFINE III SHORT PEN 31G X 8 MM MISC 2 (two) times daily. 06/15/19   [provider]  digoxin (LANOXIN) 0.125 MG tablet Take 1 tablet (0.125 mg total) by mouth daily. 04/23/17   Dustin Flock, MD  furosemide (LASIX) 20 MG tablet  Take 1 tablet (20 mg total) by mouth 2 (two) times daily. 05/28/19   A[place]urn Bilberry, MD  gabapentin (NEURONTIN) 100 MG capsule Take 100-300 mg by mouth See admin instructions. Take 1 capsule ( ) by mouth at dinnertime and take 3 capsules ( ) by mouth at bedtime    [provider]  glimepiride (AMARYL) 4 MG tablet Take 4 mg by mouth daily with breakfast.     [provider]  glucose blood (FREESTYLE LITE) test strip USE TWICE DAILY AS DIRECTED 06/15/19   [provider]  Insulin  Degludec 200 UNIT/ML SOPN Inject 130 Units into the skin daily.     [provider]  irbesartan (AVAPRO) 300 MG tablet Take 300 mg by mouth daily.     [provider]  metFORMIN (GLUCOPHAGE-XR) 500 MG 24 hr tablet Take 1,000 mg by mouth 2 (two) times daily.     [provider]  metoprolol succinate (TOPROL-XL) 25 MG 24 hr tablet Take 12.5 mg by mouth daily.     [provider]  triamcinolone cream (KENALOG) 0.1 % Apply 1 application topically 2 (two) times daily as needed (skin irritations).     [provider]    Allergies Sitagliptin  Family History  Problem Relation Age of Onset  . Hypertension Mother     Social History Social History   Tobacco Use  . Smoking status: Former Smoker    Packs/day: 0.50    Years: 50.00    Pack years: 25.00    Types: Cigarettes  . Smokeless tobacco: Current User  Substance Use Topics  . Alcohol use: No    Alcohol/week: 0.0 standard drinks  . Drug use: No    Review of Systems Constitutional: No fever/chills Eyes: No visual changes. ENT: No sore throat. Cardiovascular: Denies chest pain. Respiratory: Denies shortness of breath. Gastrointestinal: No abdominal pain.  No nausea, no vomiting.  No diarrhea.  No constipation. Genitourinary: Negative for dysuria. Musculoskeletal: Negative for neck pain.  Negative for back pain. Integumentary: Negative for rash. Neurological: Negative for headaches, focal weakness or numbness. Endocrine: Positive for hypoglycemia  ____________________________________________   PHYSICAL EXAM:  VITAL SIGNS: ED Triage Vitals  Enc Vitals Group     BP 10/29/19 0031 98/70     Pulse Rate 10/29/19 0031 61     Resp 10/29/19 0031 16     Temp 10/29/19 0052 (!) 92.7 F (33.7 C)     Temp Source 10/29/19 0052 Rectal     SpO2 10/29/19 0031 100 %     Weight 10/29/19 0021 96.2 kg (212 lb)     Height 10/29/19 0021 1.778 m ( )     Head Circumference --      Peak Flow --       Pain Score 10/29/19 0020 0     Pain Loc --      Pain Edu? --      Constitutional: Alert and oriented.  Somnolent Eyes: Conjunctivae are normal.  Head: Atraumatic. Mouth/Throat: Patient is wearing a mask. Neck: No stridor.  No meningeal signs.   Cardiovascular: Normal rate, regular rhythm. Good peripheral circulation. Grossly normal heart sounds. Respiratory: Normal respiratory effort.  No retractions. Gastrointestinal: Soft and nontender. No distention.  Musculoskeletal: No lower extremity tenderness nor edema. No gross deformities of extremities. Neurologic:  Normal speech and language. No gross focal neurologic deficits are appreciated.  Skin: Multiple areas of ecchymosis bilateral upper extremity with skin tear to the right elbow and left dorsal hand Psychiatric: Mood  and affect are normal. Speech and behavior are normal.  ____________________________________________   LABS (all labs ordered are listed, but only abnormal results are displayed)  Labs Reviewed  GLUCOSE, CAPILLARY - Abnormal; Notable for the following components:      Result Value   Glucose-Capillary 16 (*)    All other components within normal limits  GLUCOSE, CAPILLARY - Abnormal; Notable for the following components:   Glucose-Capillary 13 (*)    All other components within normal limits  CBC - Abnormal; Notable for the following components:   WBC 17.6 (*)    All other components within normal limits  COMPREHENSIVE METABOLIC PANEL - Abnormal; Notable for the following components:   CO2 21 (*)    Glucose, Bld 147 (*)    BUN 27 (*)    Creatinine, Ser 1.50 (*)    Calcium 8.8 (*)    Total Bilirubin 1.5 (*)    GFR calc non Af Amer 45 (*)    GFR calc Af Amer 52 (*)    All other components within normal limits  CK - Abnormal; Notable for the following components:   Total CK 465 (*)    All other components within normal limits  GLUCOSE, CAPILLARY - Abnormal; Notable for the following components:    Glucose-Capillary 54 (*)    All other components within normal limits  GLUCOSE, CAPILLARY - Abnormal; Notable for the following components:   Glucose-Capillary 157 (*)    All other components within normal limits  GLUCOSE, CAPILLARY - Abnormal; Notable for the following components:   Glucose-Capillary 226 (*)    All other components within normal limits  LACTIC ACID, PLASMA - Abnormal; Notable for the following components:   Lactic Acid, Venous 6.6 (*)    All other components within normal limits  GLUCOSE, CAPILLARY - Abnormal; Notable for the following components:   Glucose-Capillary 185 (*)    All other components within normal limits  TROPONIN I (HIGH SENSITIVITY) - Abnormal; Notable for the following components:   Troponin I (High Sensitivity) 262 (*)    All other components within normal limits  CULTURE, BLOOD (ROUTINE X 2)  CULTURE, BLOOD (ROUTINE X 2)  URINE CULTURE  URINALYSIS, COMPLETE (UACMP) WITH MICROSCOPIC  LACTIC ACID, PLASMA  APTT  PROTIME-INR  URINALYSIS, ROUTINE W REFLEX MICROSCOPIC  POC SARS CORONAVIRUS 2 AG -  ED  POC SARS CORONAVIRUS 2 AG  TROPONIN I (HIGH SENSITIVITY)   ____________________________________________  EKG  ED ECG REPORT I, [place] N BROWN, the attending physician, personally viewed and interpreted this ECG.   Date: 10/29/2019  EKG Time: 12:29 AM  Rate: 74  Rhythm: Atrial fibrillation  Axis: Normal  Intervals: Normal  ST&T Change: None  ____________________________________________  RADIOLOGY I, [place] N BROWN, personally viewed and evaluated these images (plain radiographs) as part of my medical decision making, as well as reviewing the written report by the radiologist.  ED MD interpretation: No acute intracranial abnormality  Official radiology report(s): Ct Head Wo Contrast  Result Date: 10/29/2019 CLINICAL DATA:  Fall EXAM: CT HEAD WITHOUT CONTRAST TECHNIQUE: Contiguous axial images were obtained from the base of the  skull through the vertex without intravenous contrast. COMPARISON:  None. FINDINGS: Brain: No evidence of acute territorial infarction, hemorrhage, hydrocephalus,extra-axial collection or mass lesion/mass effect. There is dilatation the ventricles and sulci consistent with age-related atrophy. Low-attenuation changes in the deep white matter consistent with small vessel ischemia. 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 Skull:  The skull is intact. No fracture or focal lesion identified. Sinuses/Orbits: The visualized paranasal sinuses and mastoid air cells are clear. The orbits and globes intact. Other: None IMPRESSION: No acute intracranial abnormality. 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 Electronically Signed   By: Jonna Clark M.D.   On: 10/29/2019 01:29   Dg Chest Portable 1 View  Result Date: 10/29/2019 CLINICAL DATA:  Hypoglycemia EXAM: PORTABLE CHEST 1 VIEW COMPARISON:  May 26, 2019 FINDINGS: The cardiomediastinal silhouette is unchanged from prior exam. Overlying median sternotomy wires. There is a small right pleural effusion, as on the prior exam. There is mild prominence of the central pulmonary vasculature. No acute osseous abnormality. IMPRESSION: Small right pleural effusion as on prior exam. Findings suggestive of mild pulmonary vascular congestion. Electronically Signed   By: Jonna Clark M.D.   On: 10/29/2019 00:37    ____________________________________________   PROCEDURES     .Critical Care Performed by: Darci Current, MD Authorized by: Darci Current, MD   Critical care provider statement:    Critical care time (minutes):  60   Critical care time was exclusive of:  Separately billable procedures and treating other patients   Critical care was necessary to treat or prevent imminent or  life-threatening deterioration of the following conditions:  Metabolic crisis, sepsis and cardiac failure   Critical care was time spent personally by me on the following activities:  Development of treatment plan with patient or surrogate, discussions with consultants, evaluation of patient's response to treatment, examination of patient, obtaining history from patient or surrogate, ordering and performing treatments and interventions, ordering and review of laboratory studies, ordering and review of radiographic studies, pulse oximetry, re-evaluation of patient's condition and review of old charts     ____________________________________________   INITIAL IMPRESSION / MDM / ASSESSMENT AND PLAN / ED COURSE  As part of my medical decision making, I reviewed the following data within the electronic MEDICAL RECORD NUMBER   73 year old male presented with above-stated history and physical exam with noted hypoglycemia on arrival.  Patient immediately received 1 amp of D50 IV D10 normal saline initiated at 100 mL/h.  Repeat glucose 54 and a such patient received an additional amp of D50 IV.  Patient also noted to be hypothermic and as such bear hugger was applied.  Concern for possible sepsis given hypoglycemia hypothermia and leukocytosis of 17.6 noted on laboratory data with a lactic acid of 6.6.  In addition patient noted to have a troponin of 262.  EKG revealed no evidence of ischemia or infarction.  Sepsis protocol was initiated patient received appropriate IV antibiotic therapy.     ____________________________________________  FINAL CLINICAL IMPRESSION(S) / ED DIAGNOSES  Final diagnoses:  Hypoglycemia  Sepsis, due to unspecified organism, unspecified whether acute organ dysfunction present (HCC)  Elevated troponin     MEDICATIONS GIVEN DURING THIS VISIT:  Medications  dextrose 10 % infusion ( Intravenous Stopped 10/29/19 0110)  metroNIDAZOLE (FLAGYL) IVPB 500 mg (has no administration in  time range)  vancomycin (VANCOCIN) IVPB 1000 mg/200 mL premix (1,000 mg Intravenous New Bag/Given 10/29/19 0205)  dextrose 50 % solution (50 mLs  Given 10/29/19 0028)  dextrose 50 % solution (50 mLs  Given 10/29/19 0010)  ceFEPIme (MAXIPIME) 2 g in sodium chloride 0.9 % 100 mL IVPB (0 g Intravenous Stopped 10/29/19 0204)     ED Discharge Orders    None      *  Please note:  Jeremiah Chapman was evaluated in Emergency Department on 10/29/2019 for the symptoms described in the history of present illness. He was evaluated in the context of the global COVID-19 pandemic, which necessitated consideration that the patient might be at risk for infection with the SARS-CoV-2 virus that causes COVID-19. Institutional protocols and algorithms that pertain to the evaluation of patients at risk for COVID-19 are in a state of rapid change based on information released by regulatory bodies including the CDC and federal and state organizations. These policies and algorithms were followed during the patient's care in the ED.  Some ED evaluations and interventions may be delayed as a result of limited staffing during the pandemic.*  Note:  This document was prepared using Dragon voice recognition software and may include unintentional dictation errors.   Darci Current, MD 10/29/19 [phone]

## 2019-10-30 ENCOUNTER — Inpatient Hospital Stay
Admit: 2019-10-30 | Discharge: 2019-10-30 | Disposition: A | Discharge status: Home / Self Care | Payer: Medicare Other | Attending: Internal Medicine | Admitting: Internal Medicine

## 2019-10-30 ENCOUNTER — Encounter
Admission: EM | Disposition: A | Discharge status: Skilled Nursing Facility | Payer: Self-pay | Source: Home / Self Care | Attending: Internal Medicine

## 2019-10-30 ENCOUNTER — Inpatient Hospital Stay: Payer: Medicare Other

## 2019-10-30 ENCOUNTER — Encounter: Payer: Self-pay | Admitting: Emergency Medicine

## 2019-10-30 DIAGNOSIS — I5023 Acute on chronic systolic (congestive) heart failure: Secondary | ICD-10-CM

## 2019-10-30 DIAGNOSIS — I214 Non-ST elevation (NSTEMI) myocardial infarction: Secondary | ICD-10-CM

## 2019-10-30 HISTORY — PX: LEFT HEART CATH AND CORS/GRAFTS ANGIOGRAPHY: CATH118250

## 2019-10-30 LAB — CBC
HCT: 39.7 % (ref 39.0–52.0)
Hemoglobin: 12.8 g/dL — ABNORMAL LOW (ref 13.0–17.0)
MCH: 30.9 pg (ref 26.0–34.0)
MCHC: 32.2 g/dL (ref 30.0–36.0)
MCV: 95.9 fL (ref 80.0–100.0)
Platelets: 195 10*3/uL (ref 150–400)
RBC: 4.14 MIL/uL — ABNORMAL LOW (ref 4.22–5.81)
RDW: 14 % (ref 11.5–15.5)
WBC: 10.5 10*3/uL (ref 4.0–10.5)
nRBC: 0 % (ref 0.0–0.2)

## 2019-10-30 LAB — GLUCOSE, CAPILLARY
Glucose-Capillary: 83 mg/dL (ref 70–99)
Glucose-Capillary: 40 mg/dL — CL (ref 70–99)
Glucose-Capillary: 138 mg/dL — ABNORMAL HIGH (ref 70–99)
Glucose-Capillary: 133 mg/dL — ABNORMAL HIGH (ref 70–99)
Glucose-Capillary: 110 mg/dL — ABNORMAL HIGH (ref 70–99)
Glucose-Capillary: 178 mg/dL — ABNORMAL HIGH (ref 70–99)
Glucose-Capillary: 155 mg/dL — ABNORMAL HIGH (ref 70–99)
Glucose-Capillary: 116 mg/dL — ABNORMAL HIGH (ref 70–99)

## 2019-10-30 LAB — BASIC METABOLIC PANEL
Anion gap: 11 (ref 5–15)
BUN: 38 mg/dL — ABNORMAL HIGH (ref 8–23)
CO2: 22 mmol/L (ref 22–32)
Calcium: 8.4 mg/dL — ABNORMAL LOW (ref 8.9–10.3)
Chloride: 105 mmol/L (ref 98–111)
Creatinine, Ser: 1.81 mg/dL — ABNORMAL HIGH (ref 0.61–1.24)
GFR calc Af Amer: 41 mL/min — ABNORMAL LOW (ref >60)
GFR calc non Af Amer: 36 mL/min — ABNORMAL LOW (ref >60)
Glucose, Bld: 188 mg/dL — ABNORMAL HIGH (ref 70–99)
Potassium: 4.1 mmol/L (ref 3.5–5.1)
Sodium: 138 mmol/L (ref 135–145)

## 2019-10-30 LAB — APTT: aPTT: 93 seconds — ABNORMAL HIGH (ref 24–36)

## 2019-10-30 LAB — CORTISOL: Cortisol, Plasma: 15 ug/dL

## 2019-10-30 LAB — URINE CULTURE: Culture: NO GROWTH

## 2019-10-30 LAB — HEPARIN LEVEL (UNFRACTIONATED)
Heparin Unfractionated: 1.36 IU/mL — ABNORMAL HIGH (ref 0.30–0.70)
Heparin Unfractionated: 1.63 IU/mL — ABNORMAL HIGH (ref 0.30–0.70)

## 2019-10-30 LAB — ECHOCARDIOGRAM COMPLETE
Height: 69 in
Weight: 3200 oz

## 2019-10-30 LAB — DIGOXIN LEVEL: Digoxin Level: 1 ng/mL (ref 0.8–2.0)

## 2019-10-30 LAB — TROPONIN I (HIGH SENSITIVITY): Troponin I (High Sensitivity): 2571 ng/L (ref <18)

## 2019-10-30 SURGERY — LEFT HEART CATH AND CORS/GRAFTS ANGIOGRAPHY
Anesthesia: Moderate Sedation

## 2019-10-30 MED ORDER — HEPARIN (PORCINE) IN NACL 1000-0.9 UT/500ML-% IV SOLN
INTRAVENOUS | Status: AC
  Filled 2019-10-30: qty 1000

## 2019-10-30 MED ORDER — FUROSEMIDE 10 MG/ML IJ SOLN
INTRAMUSCULAR | Status: DC | PRN
  Administered 2019-10-30: 40 mg via INTRAVENOUS

## 2019-10-30 MED ORDER — FENTANYL CITRATE (PF) 100 MCG/2ML IJ SOLN
INTRAMUSCULAR | Status: AC
  Filled 2019-10-30: qty 2

## 2019-10-30 MED ORDER — APIXABAN 5 MG PO TABS
5.0000 mg | ORAL_TABLET | Freq: Two times a day (BID) | ORAL | Status: DC
  Administered 2019-10-30 – 2019-11-06 (×14): 5 mg via ORAL
  Filled 2019-10-30 (×14): qty 1

## 2019-10-30 MED ORDER — SODIUM CHLORIDE 0.9 % IV SOLN
2.0000 g | Freq: Two times a day (BID) | INTRAVENOUS | Status: DC
  Administered 2019-10-30: 2 g via INTRAVENOUS
  Filled 2019-10-30: qty 2

## 2019-10-30 MED ORDER — FUROSEMIDE 10 MG/ML IJ SOLN
INTRAMUSCULAR | Status: AC
  Filled 2019-10-30: qty 4

## 2019-10-30 MED ORDER — SODIUM CHLORIDE 0.9 % IV SOLN: 250.0000 mL | INTRAVENOUS | Status: DC | PRN

## 2019-10-30 MED ORDER — SODIUM CHLORIDE 0.9% FLUSH: 3.0000 mL | INTRAVENOUS | Status: DC | PRN

## 2019-10-30 MED ORDER — DEXTROSE 50 % IV SOLN
INTRAVENOUS | Status: AC
  Administered 2019-10-30: 50 mL
  Filled 2019-10-30: qty 50

## 2019-10-30 MED ORDER — HEPARIN (PORCINE) IN NACL 1000-0.9 UT/500ML-% IV SOLN
INTRAVENOUS | Status: DC | PRN
  Administered 2019-10-30: 500 mL

## 2019-10-30 MED ORDER — SODIUM CHLORIDE 0.9 % WEIGHT BASED INFUSION
1.0000 mL/kg/h | INTRAVENOUS | Status: AC
  Administered 2019-10-30: 1 mL/kg/h via INTRAVENOUS

## 2019-10-30 MED ORDER — HYDRALAZINE HCL 20 MG/ML IJ SOLN: 10.0000 mg | INTRAMUSCULAR | Status: AC | PRN

## 2019-10-30 MED ORDER — ONDANSETRON HCL 4 MG/2ML IJ SOLN: 4.0000 mg | Freq: Four times a day (QID) | INTRAMUSCULAR | Status: DC | PRN

## 2019-10-30 MED ORDER — ASPIRIN 81 MG PO CHEW
CHEWABLE_TABLET | ORAL | Status: AC
  Filled 2019-10-30: qty 1

## 2019-10-30 MED ORDER — LABETALOL HCL 5 MG/ML IV SOLN: 10.0000 mg | INTRAVENOUS | Status: AC | PRN

## 2019-10-30 MED ORDER — IOHEXOL 300 MG/ML  SOLN
INTRAMUSCULAR | Status: DC | PRN
  Administered 2019-10-30: 125 mL

## 2019-10-30 MED ORDER — SODIUM CHLORIDE 0.9 % WEIGHT BASED INFUSION: 1.0000 mL/kg/h | INTRAVENOUS | Status: DC

## 2019-10-30 MED ORDER — MIDAZOLAM HCL 2 MG/2ML IJ SOLN
INTRAMUSCULAR | Status: AC
  Filled 2019-10-30: qty 2

## 2019-10-30 MED ORDER — ACETAMINOPHEN 325 MG PO TABS: 650.0000 mg | ORAL_TABLET | ORAL | Status: DC | PRN

## 2019-10-30 MED ORDER — METRONIDAZOLE IN NACL 5-0.79 MG/ML-% IV SOLN
500.0000 mg | Freq: Three times a day (TID) | INTRAVENOUS | Status: DC
  Administered 2019-10-30 – 2019-10-31 (×2): 500 mg via INTRAVENOUS
  Filled 2019-10-30: qty 100

## 2019-10-30 MED ORDER — VANCOMYCIN VARIABLE DOSE PER UNSTABLE RENAL FUNCTION (PHARMACIST DOSING): Status: DC

## 2019-10-30 MED ORDER — SODIUM CHLORIDE 0.9 % WEIGHT BASED INFUSION: 3.0000 mL/kg/h | INTRAVENOUS | Status: DC

## 2019-10-30 MED ORDER — ASPIRIN 81 MG PO CHEW
81.0000 mg | CHEWABLE_TABLET | ORAL | Status: AC
  Administered 2019-10-30: 81 mg via ORAL

## 2019-10-30 SURGICAL SUPPLY — 9 items
CATH INFINITI 5FR ANG PIGTAIL (CATHETERS) ×3 IMPLANT
CATH INFINITI 5FR JL4 (CATHETERS) ×3 IMPLANT
CATH INFINITI JR4 5F (CATHETERS) ×3 IMPLANT
DEVICE CLOSURE MYNXGRIP 5F (Vascular Products) ×3 IMPLANT
KIT MANI 3VAL PERCEP (MISCELLANEOUS) ×3 IMPLANT
NEEDLE PERC 18GX7CM (NEEDLE) ×3 IMPLANT
PACK CARDIAC CATH (CUSTOM PROCEDURE TRAY) ×3 IMPLANT
SHEATH AVANTI 5FR X 11CM (SHEATH) ×3 IMPLANT
WIRE GUIDERIGHT .035X150 (WIRE) ×3 IMPLANT

## 2019-10-30 NOTE — Progress Notes (Signed)
PT Cancellation Note  Patient Details Name: Jeremiah Chapman MRN: [id number] DOB: [birth year]   Cancelled Treatment:    Reason Eval/Treat Not Completed: Medical issues which prohibited therapy(Currently off unit for cardiac cath. Will re-attempt evaluation once returned to floor and bed-rest time completed.)   Arianie Couse H. Owens Shark, PT, DPT, NCS 10/30/19, 9:51 AM [phone]

## 2019-10-30 NOTE — ED Notes (Signed)
Called pharmacy regarding elevated heparin levels, pharmacy will reach out to lab to discuss. No new orders at present

## 2019-10-30 NOTE — Progress Notes (Signed)
Pharmacy Antibiotic Note  Jeremiah Chapman is a 73 y.o. male admitted on 10/29/2019 with sepsis.  Pharmacy has been consulted for Cefepime and Vancomycin dosing.  Initial doses given in ED  Plan: Continue Cefepime 2gm IV q12hrs for now and monitor Scr daily  For Vancomycin - pt appears to be in aki now Scr 1.38>1.81  Will check random level with am labs and dose per level  Height: 5\' 9"  (175.3 cm) Weight: 200 lb (90.7 kg) IBW/kg (Calculated) : 70.7  Temp (24hrs), Avg:98.2 F (36.8 C), Min:97.7 F (36.5 C), Max:98.7 F (37.1 C)  Recent Labs  Lab 10/29/19 0016 10/29/19 0106 10/29/19 0301 10/30/19 0319  WBC 17.6*  --  14.4* 10.5  CREATININE 1.50*  --  1.38* 1.81*  LATICACIDVEN  --  6.6* 3.9*  --     Estimated Creatinine Clearance: 39.3 mL/min (A) (by C-G formula based on SCr of 1.81 mg/dL (H)).    Allergies  Allergen Reactions  . Sitagliptin Rash and Other (See Comments)    Januvia   Antimicrobials this admission: Cefepime 11/30 >>  Vancomycin 11/30 >>  Flagyl 11/30 >>  Dose adjustments this admission: Adj Cefepime from 2g q8 to 2g q12 (CrCl btw 30-59 ml/min)  Microbiology results:  BCx: 11/30 pending  UCx:  11/30 pending   COVID19 PENDING  Thank you for allowing pharmacy to be a part of this patient's care.  Lu Duffel, PharmD, BCPS Clinical Pharmacist 10/30/2019 7:45 AM

## 2019-10-30 NOTE — ED Notes (Addendum)
Patient repositioned in bed with assistance of tech. Emptied urinal, patient voices no complaints other than soreness behind L ear where O2 tubing is placed. Wrapped gauze around tubing at the site for comfort. Patient's CBG checked, 83

## 2019-10-30 NOTE — Progress Notes (Signed)
OT Cancellation Note  Patient Details Name: Jeremiah Chapman MRN: [id number] DOB: September 21, 1946   Cancelled Treatment:    Reason Eval/Treat Not Completed: Other (comment). Pt noted to be off the floor. Per chart, plan for cardiac catheterization. Will re-attempt OT evaluation at later date/time as pt is available and medically appropriate.   Jeni Salles, MPH, MS, OTR/L ascom [phone] 10/30/19, 7:56 AM

## 2019-10-30 NOTE — ED Notes (Signed)
Patient assisted to bedside commode to have BM. Able to transfer patient with 1 assist. Patient wishes to sit on BSC temporarily, VSS  Mepilex cut and applied to O2 tubing to protect ears.

## 2019-10-30 NOTE — ED Notes (Signed)
Lab is sending a phlebotomist to draw heparin and PTT levels

## 2019-10-30 NOTE — Progress Notes (Signed)
Bedside ECHO completed. BG: 178. Page to MD re: IVF's D5W. Condom cath placed on pt. Per pt. Request. Easily dyspneic with min. Exertion.

## 2019-10-30 NOTE — Consult Note (Signed)
ANTICOAGULATION CONSULT NOTE - Initial Consult  Pharmacy Consult for Heparin Drip Indication: chest pain/ACS/NSTEMI  Allergies  Allergen Reactions  . Sitagliptin Rash and Other (See Comments)    Januvia    Patient Measurements: Height: 5\' 10"  (177.8 cm) Weight: 212 lb (96.2 kg) IBW/kg (Calculated) : 73 Heparin Dosing Weight: 92.7kg  Vital Signs: Temp: 98.7 F (37.1 C) (12/01 0153) Temp Source: Oral (12/01 0153) BP: 130/68 (12/01 0330) Pulse Rate: 76 (12/01 0353)  Labs: Recent Labs    10/29/19 0016 10/29/19 0301 10/29/19 0908 10/30/19 0012  HGB 14.4 13.4  --   --   HCT 43.3 40.1  --   --   PLT 243 226  --   --   APTT  --  35  --   --   LABPROT  --  16.4*  --   --   INR  --  1.3*  --   --   HEPARINUNFRC  --   --   --  1.63*  CREATININE 1.50* 1.38*  --   --   CKTOTAL 465*  --   --   --   TROPONINIHS 262* 555* 2,140*  --     Estimated Creatinine Clearance: 53.8 mL/min (A) (by C-G formula based on SCr of 1.38 mg/dL (H)).   Medical History: Past Medical History:  Diagnosis Date  . Atrial fibrillation ([place])   . CAD (coronary artery disease)   . CHF (congestive heart failure) ([place])   . Diabetes mellitus without complication ([place])   . Gout   . Hypercholesterolemia   . Hypertension     Medications:  Pt on Eliquis PTA, but last reported dose is 11/8 @ 10 am    Assessment: Pharmacy has been consulted to initiate/monitor heparin drip in 73 yo male with suspected NSTEMI.  Will obtain baseline heparin level in addition to APTT/INR/CBC to verify (history of Eliquis).    Troponins trending 555>2140 ng/L  Patient is confused about when last Apixaban dose was taken. Last dose most likely 11/29.  Goal of Therapy:  Heparin level 0.3-0.7 units/ml Monitor platelets by anticoagulation protocol: Yes   Plan:  12/1 @ 0012 HL: 1.63 aPTT reported to be >160 by lab. Labs were drawn by RN. Request was put in for lab to redraw levels. Heparin infusion held during that time.   Levels drawn ~30 minutes after heparin infusion stopped. Scr increase 1.38>> 1.81.  Plan to restart heparin infusion @ reduced rate of 1100 units/hr. Will recheck aPTT in 6 hours.   Continue to monitor CBC daily per protocol.  Pernell Dupre, PharmD, BCPS Clinical Pharmacist 10/30/2019 4:12 AM

## 2019-10-30 NOTE — ED Notes (Signed)
Patient requested to sit on side of bed, patient stable, has call bell within reach. Told patient to call RN when ready to lay back in bed. Patient feels more comfortable sitting up for a bit

## 2019-10-30 NOTE — Progress Notes (Signed)
*  PRELIMINARY RESULTS* Echocardiogram 2D Echocardiogram has been performed.  Jeremiah Chapman 10/30/2019, 11:45 AM

## 2019-10-30 NOTE — Progress Notes (Signed)
[facility] Encounter Note  Patient: Jeremiah Chapman / Admit Date: 10/29/2019 / Date of Encounter: 10/30/2019, 8:39 AM   Subjective: Patient still mildly short of breath with and without physical activity but O2 sat is normal with no evidence of rails or pulmonary edema.  Elevated troponin consistent with non-ST elevation myocardial infarction Telemetry showing atrial fibrillation with bundle branch block with variable heart rate but no evidence of significant long RR intervals or heart block Cardiac catheterization showing moderate segmental LV systolic dysfunction with ejection fraction of 35% with 6 akinesis of the posterior lateral wall Native LAD occluded Native circumflex with stent patent to PDA and obtuse marginal 1 with severe complicated stenosis of 90% Nondominant right with minimal atherosclerosis Graft to ramus patent Graft to LAD patent  Review of Systems: Positive for: Shortness of breath Negative for: Vision change, hearing change, syncope, dizziness, nausea, vomiting,diarrhea, bloody stool, stomach pain, cough, congestion, diaphoresis, urinary frequency, urinary pain,skin lesions, skin rashes Others previously listed  Objective: Telemetry: Atrial fibrillation with controlled ventricular rate and bundle branch block Physical Exam: Blood pressure (!) 123/58, pulse 67, temperature 97.7 F (36.5 C), temperature source Oral, resp. rate (!) 27, height 5\' 9"  (1.753 m), weight 90.7 kg, SpO2 97 %. Body mass index is 29.53 kg/m. General: Well developed, well nourished, in no acute distress. Head: Normocephalic, atraumatic, sclera non-icteric, no xanthomas, nares are without discharge. Neck: No apparent masses Lungs: Normal respirations with no wheezes, no rhonchi, no rales , no crackles   Heart: irregular rate and rhythm, normal S1 S2, no murmur, no rub, no gallop, PMI is normal size and placement, carotid upstroke normal without bruit, jugular venous  pressure normal Abdomen: Soft, non-tender, non-distended with normoactive bowel sounds. No hepatosplenomegaly. Abdominal aorta is normal size without bruit Extremities: Trace edema, no clubbing, no cyanosis, no ulcers,  Peripheral: 2+ radial, 2+ femoral, 2+ dorsal pedal pulses Neuro: Alert and oriented. Moves all extremities spontaneously. Psych:  Responds to questions appropriately with a normal affect.   Intake/Output Summary (Last 24 hours) at 10/30/2019 0839 Last data filed at 10/30/2019 0700 Gross per 24 hour  Intake 300 ml  Output 600 ml  Net -300 ml    Inpatient Medications:  . aspirin      . [MAR Hold] aspirin EC  81 mg Oral Daily  . [MAR Hold] atorvastatin  80 mg Oral Daily  . [MAR Hold] furosemide  40 mg Intravenous Daily  . [MAR Hold] gabapentin  300 mg Oral QHS  . [MAR Hold] insulin aspart  0-6 Units Subcutaneous Q4H  . [MAR Hold] sodium chloride flush  3 mL Intravenous Q12H  . [MAR Hold] vancomycin variable dose per unstable renal function (pharmacist dosing)   Does not apply See admin instructions   Infusions:  . sodium chloride    . [START ON 10/31/2019] sodium chloride     Followed by  . [START ON 10/31/2019] sodium chloride    . [MAR Hold] ceFEPime (MAXIPIME) IV Stopped (10/30/19 0007)  . dextrose 100 mL/hr at 10/30/19 0333  . heparin Stopped (10/30/19 0700)  . [MAR Hold] metronidazole Stopped (10/30/19 0422)    Labs: Recent Labs    10/29/19 0301 10/30/19 0319  NA 137 138  K 4.7 4.1  CL 102 105  CO2 21* 22  GLUCOSE 215* 188*  BUN 30* 38*  CREATININE 1.38* 1.81*  CALCIUM 8.7* 8.4*   Recent Labs    10/29/19 0016 10/29/19 0301  AST 38 37  ALT 18 19  ALKPHOS 80 75  BILITOT 1.5* 1.8*  PROT 7.5 7.2  ALBUMIN 3.6 3.5   Recent Labs    10/29/19 0301 10/30/19 0319  WBC 14.4* 10.5  NEUTROABS 12.6*  --   HGB 13.4 12.8*  HCT 40.1 39.7  MCV 92.2 95.9  PLT 226 195   Recent Labs    10/29/19 0016  CKTOTAL 465*   Invalid input(s): POCBNP Recent  Labs    10/29/19 0301  HGBA1C 6.5*     Weights: Filed Weights   10/29/19 0021 10/30/19 0714  Weight: 96.2 kg 90.7 kg     Radiology/Studies:  Ct Head Wo Contrast  Result Date: 10/29/2019 CLINICAL DATA:  Fall EXAM: CT HEAD WITHOUT CONTRAST TECHNIQUE: Contiguous axial images were obtained from the base of the skull through the vertex without intravenous contrast. COMPARISON:  None. FINDINGS: Brain: No evidence of acute territorial infarction, hemorrhage, hydrocephalus,extra-axial collection or mass lesion/mass effect. There is dilatation the ventricles and sulci consistent with age-related atrophy. Low-attenuation changes in the deep white matter consistent with small vessel ischemia. 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 Skull: The skull is intact. No fracture or focal lesion identified. Sinuses/Orbits: The visualized paranasal sinuses and mastoid air cells are clear. The orbits and globes intact. Other: None IMPRESSION: No acute intracranial abnormality. 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 Electronically Signed   By: Prudencio Pair M.D.   On: 10/29/2019 01:29   Ct Chest Wo Contrast  Result Date: 10/29/2019 CLINICAL DATA:  Shortness of breath EXAM: CT CHEST WITHOUT CONTRAST TECHNIQUE: Multidetector CT imaging of the chest was performed following the standard protocol without IV contrast. COMPARISON:  None. FINDINGS: Cardiovascular: The heart is enlarged. No pericardial effusion. Surgical changes from coronary artery bypass surgery are noted. Extensive three-vessel coronary artery calcifications. Moderate aortic calcifications but no aneurysm. Mediastinum/Nodes: Small scattered mediastinal and hilar lymph nodes but no mass or overt adenopathy. Lungs/Pleura: There is a moderate-size right pleural effusion with overlying  atelectasis. There is also loculated pleural fluid anteriorly with overlying right middle lobe atelectasis which has the appearance of rounded atelectasis. There is also a small left pleural effusion with minimal overlying atelectasis. I do not see any findings for pulmonary edema or worrisome pulmonary nodules. Emphysematous changes are noted. Upper Abdomen: No significant upper abdominal findings. Musculoskeletal: No chest wall mass, supraclavicular or axillary adenopathy. The thyroid gland is grossly normal. The bony structures are intact. IMPRESSION: 1. Moderate-sized right pleural effusion with overlying atelectasis. Anterior loculated component is noted with overlying probable rounded atelectasis. I would recommend a follow-up chest CT in 3 months to make sure this resolves or is at least stable. 2. No definite pulmonary mass or worrisome pulmonary nodules. 3. Small left effusion with minimal overlying atelectasis. 4. Cardiac enlargement. 5. 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 Aortic Atherosclerosis (ICD10-I70.0) and Emphysema (ICD10-J43.9). Electronically Signed   By: Marijo Sanes M.D.   On: 10/29/2019 07:23   Dg Chest Portable 1 View  Result Date: 10/29/2019 CLINICAL DATA:  Hypoglycemia EXAM: PORTABLE CHEST 1 VIEW COMPARISON:  May 26, 2019 FINDINGS: The cardiomediastinal silhouette is unchanged from prior exam. Overlying median sternotomy wires. There is a small right pleural effusion, as on the prior exam. There is mild prominence of the central pulmonary vasculature. No acute osseous abnormality. IMPRESSION: Small right pleural effusion as on prior exam. Findings suggestive of  mild pulmonary vascular congestion. Electronically Signed   By: Jonna ClarkBindu  Avutu M.D.   On: 10/29/2019 00:37     Assessment and Recommendation  73 y.o. male with acute syncopal episode of unknown etiology with acute non-ST elevation myocardial infarction  with elevated troponin and chronically abnormal EKG with worsening LV systolic dysfunction from 48% down to 30 to 35% and akinesis of the posterior lateral wall with cardiac catheterization suggesting obtuse marginal artery stenosis as culprit not amenable to PCI and stent placement with patent graft to ramus LAD and patent native PDA and right coronary artery 1.  No further cardiac intervention at this time due to acute non-ST elevation myocardial infarction with no artery amenable to PCI and stent placement 2.  Continue medication management for further risk reduction cardiovascular event including high intensity cholesterol therapy aspirin and antihypertensives including a possible ACE inhibitor although currently with worsening GFR would abstain from ACE inhibitor 3.  No use of beta-blocker at this time due to significant bradycardia and concerns for sick sinus syndrome which may have been involved with episode of syncope although currently no evidence of telemetry changes 4.  Continue Lasix for extremity edema pulmonary edema congestive heart failure watching for worsening chronic kidney disease 5.  Begin cardiac rehab 6.  Further evaluation of other syncopal causes including carotid atherosclerosis  Signed, Arnoldo HookerBruce Tajai Ihde M.D. FACC

## 2019-10-30 NOTE — ED Notes (Signed)
Spoke with pharmacy, will hold off on changing heparin rate until level comes back. Pharmacist following

## 2019-10-30 NOTE — Plan of Care (Signed)

## 2019-10-30 NOTE — Discharge Instructions (Signed)
Hypoglycemia °Hypoglycemia is when the sugar (glucose) level in your blood is too low. Signs of low blood sugar may include: °· Feeling: °? Hungry. °? Worried or nervous (anxious). °? Sweaty and clammy. °? Confused. °? Dizzy. °? Sleepy. °? Sick to your stomach (nauseous). °· Having: °? A fast heartbeat. °? A headache. °? A change in your vision. °? Tingling or no feeling (numbness) around your mouth, lips, or tongue. °? Jerky movements that you cannot control (seizure). °· Having trouble with: °? Moving (coordination). °? Sleeping. °? Passing out (fainting). °? Getting upset easily (irritability). °Low blood sugar can happen to people who have diabetes and people who do not have diabetes. Low blood sugar can happen quickly, and it can be an emergency. °Treating low blood sugar °Low blood sugar is often treated by eating or drinking something sugary right away, such as: °· Fruit juice, 4-6 oz (120-150 mL). °· Regular soda (not diet soda), 4-6 oz (120-150 mL). °· Low-fat milk, 4 oz (120 mL). °· Several pieces of hard candy. °· Sugar or honey, 1 Tbsp (15 mL). °Treating low blood sugar if you have diabetes °If you can think clearly and swallow safely, follow the 15:15 rule: °· Take 15 grams of a fast-acting carb (carbohydrate). Talk with your doctor about how much you should take. °· Always keep a source of fast-acting carb with you, such as: °? Sugar tablets (glucose pills). Take 3-4 pills. °? 6-8 pieces of hard candy. °? 4-6 oz (120-150 mL) of fruit juice. °? 4-6 oz (120-150 mL) of regular (not diet) soda. °? 1 Tbsp (15 mL) honey or sugar. °· Check your blood sugar 15 minutes after you take the carb. °· If your blood sugar is still at or below 70 mg/dL (3.9 mmol/L), take 15 grams of a carb again. °· If your blood sugar does not go above 70 mg/dL (3.9 mmol/L) after 3 tries, get help right away. °· After your blood sugar goes back to normal, eat a meal or a snack within 1 hour. ° °Treating very low blood sugar °If your  blood sugar is at or below 54 mg/dL (3 mmol/L), you have very low blood sugar (severe hypoglycemia). This may also cause: °· Passing out. °· Jerky movements you cannot control (seizure). °· Losing consciousness (coma). °This is an emergency. Do not wait to see if the symptoms will go away. Get medical help right away. Call your local emergency services (911 in the [place] Do not drive yourself to the hospital. °If you have very low blood sugar and you cannot eat or drink, you may need a glucagon shot (injection). A family member or friend should learn how to check your blood sugar and how to give you a glucagon shot. Ask your doctor if you need to have a glucagon shot kit at home. °Follow these instructions at home: °General instructions °· Take over-the-counter and prescription medicines only as told by your doctor. °· Stay aware of your blood sugar as told by your doctor. °· Limit alcohol intake to no more than 1 drink a day for nonpregnant women and 2 drinks a day for men. One drink equals 12 oz of beer (355 mL), 5 oz of wine (148 mL), or 1½ oz of hard liquor (44 mL). °· Keep all follow-up visits as told by your doctor. This is important. °If you have diabetes: ° °· Follow your diabetes care plan as told by your doctor. Make sure you: °? Know the signs of low blood sugar. °?   Take your medicines as told. ? Follow your exercise and meal plan. ? Eat on time. Do not skip meals. ? Check your blood sugar as often as told by your doctor. Always check it before and after exercise. ? Follow your sick day plan when you cannot eat or drink normally. Make this plan ahead of time with your doctor.  Share your diabetes care plan with: ? Your work or school. ? People you live with.  Check your pee (urine) for ketones: ? When you are sick. ? As told by your doctor.  Carry a card or wear jewelry that says you have diabetes. Contact a doctor if:  You have trouble keeping your blood sugar in your target range.   You have low blood sugar often. Get help right away if:  You still have symptoms after you eat or drink something sugary.  Your blood sugar is at or below 54 mg/dL (3 mmol/L).  You have jerky movements that you cannot control.  You pass out. These symptoms may be an emergency. Do not wait to see if the symptoms will go away. Get medical help right away. Call your local emergency services (911 in the [place] Do not drive yourself to the hospital. Summary  Hypoglycemia happens when the level of sugar (glucose) in your blood is too low.  Low blood sugar can happen to people who have diabetes and people who do not have diabetes. Low blood sugar can happen quickly, and it can be an emergency.  Make sure you know the signs of low blood sugar and know how to treat it.  Always keep a source of sugar (fast-acting carb) with you to treat low blood sugar. This information is not intended to replace advice given to you by your health care provider. Make sure you discuss any questions you have with your health care provider. Document Released: 02/09/2010 Document Revised: 03/08/2019 Document Reviewed: 12/19/2015 [employer] Patient Education  2020 [employer]  

## 2019-10-30 NOTE — ED Notes (Signed)
Report given to cath lab, last PO intake at midnight

## 2019-10-30 NOTE — Progress Notes (Signed)
Progress Note    Jeremiah Chapman  Chapman DOB: 09-04-1946  DOA: 10/29/2019 PCP: Juluis Pitch, MD      Brief Narrative:    Medical records reviewed and are as summarized below:  Jeremiah Chapman is an 73 y.o. 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  He was brought to the hospital because of altered mental status/lethargy.  Apparently, he was found on his kitchen floor by a neighbor.  Reportedly, his blood glucose level was 50 when EMS arrived at the scene.  In the emergency room, his blood glucose was 13 and he was hypothermic and bradycardic.  In the ED, he also had complaints of increasing shortness of breath.      Assessment/Plan:   Principal Problem:   Hypoglycemia Active Problems:   Sepsis (HCC)   Acute on chronic systolic CHF (congestive heart failure) (HCC)   NSTEMI (non-ST elevated myocardial infarction) (HCC)   Body mass index is 30.52 kg/m.    Acute exacerbation of chronic systolic CHF: BNP was 7,209.  Continue IV Lasix.  Monitor BMP, urine output and daily weight.  Acute non-ST elevation MI: Continue aspirin and Lipitor.  Metoprolol on hold because of bradycardia.  Resume home dose of Eliquis.  S/p left heart cath today with report as listed below  "Patent graft to ramus (obtuse marginal) Patent LIMA graft to LAD Moderate atherosclerosis of right coronary artery Patent stent of proximal left circumflex artery Patent native left sided PDA Severe stenosis of obtuse marginal 2 with complicated takeoff and stenoses with collaterals from grafted obtuse marginal and/or ramus which appears to be the culprit of non-ST elevation myocardial infarction  Plan No further cardiac intervention at this time due to collateral flow to obtuse marginal with infarct High intensity cholesterol therapy Avoid beta-blocker due to  bradycardia and possible syncope Avoid ACE inhibitor due to severe chronic kidney disease stage iii Begin ambulation and cardiac rehabilitation and further evaluation of other possible syncopal episode issues including carotid atherosclerosis"    Probable sepsis with probable pneumonia as the likely etiology: Suspected his presenting clinical features were most likely from acute MI complicated by acute heart failure exacerbation.  Repeat chest x-ray today and consider de-escalating IV antibiotics.  Discontinue IV vancomycin but continue IV cefepime and Flagyl for now.  Follow-up blood cultures.  Type 2 diabetes mellitus: Hypoglycemia has resolved.  Glimepiride, Metformin and degludec have been held.  NovoLog as needed for hyperglycemia.  Hemoglobin O7S 6.5  Metabolic encephalopathy: Resolved  CKD stage III b: Creatinine is worse today but it's still around his baseline.  Patient received IV fluids because he underwent left heart catheterization today.  Monitor BMP.  Chronic atrial fibrillation: Resume Eliquis.  Of note, patient said that he was taking Eliquis at home up until this admission.  COPD: Stable     Family Communication/Anticipated D/C date and plan/Code Status   DVT prophylaxis: Resume Eliquis Code Status: Full code Family Communication: Plan discussed with the patient Disposition Plan: Home in 2 to 3 days depending on clinical improvement.      Subjective:   He complains of a cough.  Breathing is a little labored with exertion but overall it is better.  No chest pain.   Objective:    Vitals:   10/30/19 1130 10/30/19 1300 10/30/19 1550 10/30/19 1556  BP: 127/67 (!) 152/74 (!) 146/72   Pulse:  63 62 (!) 59   Resp: (!) 22 (!) 26 15   Temp:   98 F (36.7 C)   TempSrc:   Oral   SpO2: 97% 97% 100%   Weight:    96.5 kg  Height:    5\' 10"  (1.778 m)    Intake/Output Summary (Last 24 hours) at 10/30/2019 1639 Last data filed at 10/30/2019 1500 Gross per 24 hour    Intake 150 ml  Output 2400 ml  Net -2250 ml   Filed Weights   10/29/19 0021 10/30/19 0714 10/30/19 1556  Weight: 96.2 kg 90.7 kg 96.5 kg    Exam:  GEN: NAD SKIN: Multiple bruises and ecchymoses on bilateral upper extremities which were present on admission. EYES: no pallor or icterus ENT: MMM CV: RRR PULM: CTA B ABD: soft, obese, NT, +BS CNS: AAO x 3, non focal EXT: Left leg edema improved.  No tenderness.    Data Reviewed:   I have personally reviewed following labs and imaging studies:  Labs: Labs show the following:   Basic Metabolic Panel: Recent Labs  Lab 10/29/19 0016 10/29/19 0301 10/30/19 0319  NA 138 137 138  K 3.6 4.7 4.1  CL 102 102 105  CO2 21* 21* 22  GLUCOSE 147* 215* 188*  BUN 27* 30* 38*  CREATININE 1.50* 1.38* 1.81*  CALCIUM 8.8* 8.7* 8.4*   GFR Estimated Creatinine Clearance: 41.1 mL/min (A) (by C-G formula based on SCr of 1.81 mg/dL (H)). Liver Function Tests: Recent Labs  Lab 10/29/19 0016 10/29/19 0301  AST 38 37  ALT 18 19  ALKPHOS 80 75  BILITOT 1.5* 1.8*  PROT 7.5 7.2  ALBUMIN 3.6 3.5   No results for input(s): LIPASE, AMYLASE in the last 168 hours. No results for input(s): AMMONIA in the last 168 hours. Coagulation profile Recent Labs  Lab 10/29/19 0301  INR 1.3*    CBC: Recent Labs  Lab 10/29/19 0016 10/29/19 0301 10/30/19 0319  WBC 17.6* 14.4* 10.5  NEUTROABS  --  12.6*  --   HGB 14.4 13.4 12.8*  HCT 43.3 40.1 39.7  MCV 93.1 92.2 95.9  PLT 243 226 195   Cardiac Enzymes: Recent Labs  Lab 10/29/19 0016  CKTOTAL 465*   BNP (last 3 results) No results for input(s): PROBNP in the last 8760 hours. CBG: Recent Labs  Lab 10/30/19 0403 10/30/19 0705 10/30/19 0846 10/30/19 1148 10/30/19 1522  GLUCAP 138* 133* 110* 178* 155*   D-Dimer: No results for input(s): DDIMER in the last 72 hours. Hgb A1c: Recent Labs    10/29/19 0301  HGBA1C 6.5*   Lipid Profile: No results for input(s): CHOL, HDL,  LDLCALC, TRIG, CHOLHDL, LDLDIRECT in the last 72 hours. Thyroid function studies: Recent Labs    10/29/19 0301  TSH 1.547   Anemia work up: No results for input(s): VITAMINB12, FOLATE, FERRITIN, TIBC, IRON, RETICCTPCT in the last 72 hours. Sepsis Labs: Recent Labs  Lab 10/29/19 0016 10/29/19 0106 10/29/19 0301 10/30/19 0319  WBC 17.6*  --  14.4* 10.5  LATICACIDVEN  --  6.6* 3.9*  --     Microbiology Recent Results (from the past 240 hour(s))  Blood Culture (routine x 2)     Status: None (Preliminary result)   Collection Time: 10/29/19  1:06 AM   Specimen: BLOOD  Result Value Ref Range Status   Specimen Description BLOOD RIGHT ARM  Final   Special Requests   Final    BOTTLES DRAWN AEROBIC AND ANAEROBIC Blood Culture  adequate volume   Culture   Final    NO GROWTH 1 DAY Performed at [facility], [address] [place], [place] [address]    Report Status PENDING  Incomplete  Blood Culture (routine x 2)     Status: None (Preliminary result)   Collection Time: 10/29/19  1:06 AM   Specimen: BLOOD  Result Value Ref Range Status   Specimen Description BLOOD LEFT ARM  Final   Special Requests   Final    BOTTLES DRAWN AEROBIC AND ANAEROBIC Blood Culture adequate volume   Culture   Final    NO GROWTH 1 DAY Performed at [facility], [address] [place], [place] [address]    Report Status PENDING  Incomplete  SARS CORONAVIRUS 2 (TAT 6-24 HRS) Nasopharyngeal Nasopharyngeal Swab     Status: None   Collection Time: 10/29/19  1:36 AM   Specimen: Nasopharyngeal Swab  Result Value Ref Range Status   SARS Coronavirus 2 NEGATIVE NEGATIVE Final    Comment: (NOTE) SARS-CoV-2 target nucleic acids are NOT DETECTED. The SARS-CoV-2 RNA is generally detectable in upper and lower respiratory specimens during the acute phase of infection. Negative results do not preclude SARS-CoV-2 infection, do not rule out co-infections with other pathogens, and should not be  used as the sole basis for treatment or other patient management decisions. Negative results must be combined with clinical observations, patient history, and epidemiological information. The expected result is Negative. Fact Sheet for Patients: HairSlick.no Fact Sheet for Healthcare Providers: quierodirigir.com This test is not yet approved or cleared by the [place] FDA and  has been authorized for detection and/or diagnosis of SARS-CoV-2 by FDA under an Emergency Use Authorization (EUA). This EUA will remain  in effect (meaning this test can be used) for the duration of the COVID-19 declaration under Section 56 4(b)(1) of the Act, 21 U.S.C. section 360bbb-3(b)(1), unless the authorization is terminated or revoked sooner. Performed at [facility], [address], [place], [place] [address]   Urine culture     Status: None   Collection Time: 10/29/19  7:04 AM   Specimen: In/Out Cath Urine  Result Value Ref Range Status   Specimen Description   Final    IN/OUT CATH URINE Performed at [facility], [address] [place], [place] [address]    Special Requests   Final    NONE Performed at [facility], [address] [place], [place] [address]    Culture   Final    NO GROWTH Performed at [facility], [address], [place], [place] [address]    Report Status 10/30/2019 FINAL  Final    Procedures and diagnostic studies:  Ct Head Wo Contrast  Result Date: 10/29/2019 CLINICAL DATA:  Fall EXAM: CT HEAD WITHOUT CONTRAST TECHNIQUE: Contiguous axial images were obtained from the base of the skull through the vertex without intravenous contrast. COMPARISON:  None. FINDINGS: Brain: No evidence of acute territorial infarction, hemorrhage, hydrocephalus,extra-axial collection or mass lesion/mass effect. There is dilatation the ventricles and sulci consistent with age-related atrophy.  Low-attenuation changes in the deep white matter consistent with small vessel ischemia. 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 Skull: The skull is intact. No fracture or focal lesion identified. Sinuses/Orbits: The visualized paranasal sinuses and mastoid air cells are clear. The orbits and globes intact. Other: None IMPRESSION: No acute intracranial abnormality. 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 Electronically Signed   By: Jonna Clark M.D.   On: 10/29/2019 01:29   Ct Chest Wo Contrast  Result Date: 10/29/2019 CLINICAL DATA:  Shortness of breath EXAM: CT CHEST WITHOUT CONTRAST TECHNIQUE: Multidetector CT imaging of the chest was performed following the standard protocol without IV contrast. COMPARISON:  None. FINDINGS: Cardiovascular: The heart is enlarged. No pericardial effusion. Surgical changes from coronary artery bypass surgery are noted. Extensive three-vessel coronary artery calcifications. Moderate aortic calcifications but no aneurysm. Mediastinum/Nodes: Small scattered mediastinal and hilar lymph nodes but no mass or overt adenopathy. Lungs/Pleura: There is a moderate-size right pleural effusion with overlying atelectasis. There is also loculated pleural fluid anteriorly with overlying right middle lobe atelectasis which has the appearance of rounded atelectasis. There is also a small left pleural effusion with minimal overlying atelectasis. I do not see any findings for pulmonary edema or worrisome pulmonary nodules. Emphysematous changes are noted. Upper Abdomen: No significant upper abdominal findings. Musculoskeletal: No chest wall mass, supraclavicular or axillary adenopathy. The thyroid gland is grossly normal. The bony structures are intact. IMPRESSION: 1. Moderate-sized right pleural effusion with overlying atelectasis.  Anterior loculated component is noted with overlying probable rounded atelectasis. I would recommend a follow-up chest CT in 3 months to make sure this resolves or is at least stable. 2. No definite pulmonary mass or worrisome pulmonary nodules. 3. Small left effusion with minimal overlying atelectasis. 4. Cardiac enlargement. 5. 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 Aortic Atherosclerosis (ICD10-I70.0) and Emphysema (ICD10-J43.9). Electronically Signed   By: Rudie Meyer M.D.   On: 10/29/2019 07:23   Dg Chest Portable 1 View  Result Date: 10/29/2019 CLINICAL DATA:  Hypoglycemia EXAM: PORTABLE CHEST 1 VIEW COMPARISON:  May 26, 2019 FINDINGS: The cardiomediastinal silhouette is unchanged from prior exam. Overlying median sternotomy wires. There is a small right pleural effusion, as on the prior exam. There is mild prominence of the central pulmonary vasculature. No acute osseous abnormality. IMPRESSION: Small right pleural effusion as on prior exam. Findings suggestive of mild pulmonary vascular congestion. Electronically Signed   By: Jonna Clark M.D.   On: 10/29/2019 00:37    Medications:    aspirin       aspirin EC  81 mg Oral Daily   atorvastatin  80 mg Oral Daily   furosemide  40 mg Intravenous Daily   gabapentin  300 mg Oral QHS   insulin aspart  0-6 Units Subcutaneous Q4H   sodium chloride flush  3 mL Intravenous Q12H   Continuous Infusions:  sodium chloride 1 mL/kg/hr (10/30/19 1633)   ceFEPime (MAXIPIME) IV     metronidazole 500 mg (10/30/19 1630)     LOS: 1 day   Jelisa Show Low  [facility]   *Please refer to [email or web address], password [email or web address] to get updated schedule on who will round on this patient, as hospitalists switch teams weekly. If 7PM-7AM, please contact night-coverage at [email or web address], password [email or web address] for any overnight needs.  10/30/2019, 4:39 PM

## 2019-10-30 NOTE — ED Notes (Signed)
Patient provided with apple juice for CBG 62

## 2019-10-30 NOTE — ED Notes (Signed)
Paused heparin infusion per pharmacy

## 2019-10-30 NOTE — ED Notes (Signed)
On call MD paged regarding sugar, requested continuous fluids with dextrose, awaiting reply.  Lab at bedside

## 2019-10-30 NOTE — ED Notes (Signed)
Checked patient's sugar and provided apple juice for CBG 62. Repositioned in bed, denies pain. Urinal and call light at bedside, patient instructed to call for any assistance needs. Patient agrees, is in hospital bed currently. Lights turned off for sleep. Will routinely check CBG to monitor for hypoglycemia

## 2019-10-31 ENCOUNTER — Inpatient Hospital Stay: Payer: Medicare Other

## 2019-10-31 LAB — GLUCOSE, CAPILLARY
Glucose-Capillary: 123 mg/dL — ABNORMAL HIGH (ref 70–99)
Glucose-Capillary: 130 mg/dL — ABNORMAL HIGH (ref 70–99)
Glucose-Capillary: 62 mg/dL — ABNORMAL LOW (ref 70–99)
Glucose-Capillary: 72 mg/dL (ref 70–99)
Glucose-Capillary: 75 mg/dL (ref 70–99)
Glucose-Capillary: 90 mg/dL (ref 70–99)
Glucose-Capillary: 95 mg/dL (ref 70–99)

## 2019-10-31 LAB — CBC WITH DIFFERENTIAL/PLATELET
Abs Immature Granulocytes: 0.07 10*3/uL (ref 0.00–0.07)
Basophils Absolute: 0.1 10*3/uL (ref 0.0–0.1)
Basophils Relative: 1 %
Eosinophils Absolute: 0.3 10*3/uL (ref 0.0–0.5)
Eosinophils Relative: 3 %
HCT: 41 % (ref 39.0–52.0)
Hemoglobin: 13.2 g/dL (ref 13.0–17.0)
Immature Granulocytes: 1 %
Lymphocytes Relative: 10 %
Lymphs Abs: 1.1 10*3/uL (ref 0.7–4.0)
MCH: 31.4 pg (ref 26.0–34.0)
MCHC: 32.2 g/dL (ref 30.0–36.0)
MCV: 97.6 fL (ref 80.0–100.0)
Monocytes Absolute: 1 10*3/uL (ref 0.1–1.0)
Monocytes Relative: 9 %
Neutro Abs: 8.4 10*3/uL — ABNORMAL HIGH (ref 1.7–7.7)
Neutrophils Relative %: 76 %
Platelets: 210 10*3/uL (ref 150–400)
RBC: 4.2 MIL/uL — ABNORMAL LOW (ref 4.22–5.81)
RDW: 13.8 % (ref 11.5–15.5)
WBC: 10.9 10*3/uL — ABNORMAL HIGH (ref 4.0–10.5)
nRBC: 0 % (ref 0.0–0.2)

## 2019-10-31 LAB — BLOOD GAS, ARTERIAL
Acid-Base Excess: 1.7 mmol/L (ref 0.0–2.0)
Bicarbonate: 27.7 mmol/L (ref 20.0–28.0)
FIO2: 0.32
O2 Saturation: 98.7 %
Patient temperature: 37
pCO2 arterial: 48 mmHg (ref 32.0–48.0)
pH, Arterial: 7.37 (ref 7.350–7.450)
pO2, Arterial: 124 mmHg — ABNORMAL HIGH (ref 83.0–108.0)

## 2019-10-31 LAB — BASIC METABOLIC PANEL
Anion gap: 7 (ref 5–15)
Anion gap: 9 (ref 5–15)
BUN: 35 mg/dL — ABNORMAL HIGH (ref 8–23)
BUN: 36 mg/dL — ABNORMAL HIGH (ref 8–23)
CO2: 25 mmol/L (ref 22–32)
CO2: 26 mmol/L (ref 22–32)
Calcium: 8.4 mg/dL — ABNORMAL LOW (ref 8.9–10.3)
Calcium: 8.4 mg/dL — ABNORMAL LOW (ref 8.9–10.3)
Chloride: 105 mmol/L (ref 98–111)
Chloride: 105 mmol/L (ref 98–111)
Creatinine, Ser: 1.68 mg/dL — ABNORMAL HIGH (ref 0.61–1.24)
Creatinine, Ser: 1.69 mg/dL — ABNORMAL HIGH (ref 0.61–1.24)
GFR calc Af Amer: 45 mL/min — ABNORMAL LOW (ref >60)
GFR calc Af Amer: 45 mL/min — ABNORMAL LOW (ref >60)
GFR calc non Af Amer: 39 mL/min — ABNORMAL LOW (ref >60)
GFR calc non Af Amer: 39 mL/min — ABNORMAL LOW (ref >60)
Glucose, Bld: 111 mg/dL — ABNORMAL HIGH (ref 70–99)
Glucose, Bld: 112 mg/dL — ABNORMAL HIGH (ref 70–99)
Potassium: 4.1 mmol/L (ref 3.5–5.1)
Potassium: 4.3 mmol/L (ref 3.5–5.1)
Sodium: 138 mmol/L (ref 135–145)
Sodium: 139 mmol/L (ref 135–145)

## 2019-10-31 LAB — CREATININE, SERUM
Creatinine, Ser: 1.75 mg/dL — ABNORMAL HIGH (ref 0.61–1.24)
GFR calc Af Amer: 43 mL/min — ABNORMAL LOW (ref >60)
GFR calc non Af Amer: 37 mL/min — ABNORMAL LOW (ref >60)

## 2019-10-31 LAB — TROPONIN I (HIGH SENSITIVITY): Troponin I (High Sensitivity): 1280 ng/L (ref <18)

## 2019-10-31 LAB — BRAIN NATRIURETIC PEPTIDE: B Natriuretic Peptide: 733 pg/mL — ABNORMAL HIGH (ref 0.0–100.0)

## 2019-10-31 LAB — VANCOMYCIN, RANDOM: Vancomycin Rm: 14

## 2019-10-31 MED ORDER — SODIUM CHLORIDE 0.9 % IV SOLN
INTRAVENOUS | Status: DC | PRN
  Administered 2019-10-31: 250 mL via INTRAVENOUS

## 2019-10-31 MED ORDER — AZITHROMYCIN 250 MG PO TABS
500.0000 mg | ORAL_TABLET | Freq: Every day | ORAL | Status: AC
  Administered 2019-10-31 – 2019-11-02 (×3): 500 mg via ORAL
  Filled 2019-10-31 (×3): qty 2

## 2019-10-31 MED ORDER — CEFDINIR 300 MG PO CAPS
300.0000 mg | ORAL_CAPSULE | Freq: Two times a day (BID) | ORAL | Status: DC
  Administered 2019-10-31 – 2019-11-01 (×3): 300 mg via ORAL
  Filled 2019-10-31 (×4): qty 1

## 2019-10-31 MED ORDER — INSULIN ASPART 100 UNIT/ML ~~LOC~~ SOLN: 0.0000 [IU] | Freq: Three times a day (TID) | SUBCUTANEOUS | Status: DC

## 2019-10-31 MED ORDER — INSULIN ASPART 100 UNIT/ML ~~LOC~~ SOLN: 0.0000 [IU] | Freq: Every day | SUBCUTANEOUS | Status: DC

## 2019-10-31 MED ORDER — FUROSEMIDE 20 MG PO TABS
20.0000 mg | ORAL_TABLET | Freq: Two times a day (BID) | ORAL | Status: DC
  Administered 2019-10-31 – 2019-11-06 (×13): 20 mg via ORAL
  Filled 2019-10-31 (×13): qty 1

## 2019-10-31 NOTE — Progress Notes (Signed)
[facility] Encounter Note  Patient: Jeremiah Chapman / Admit Date: 10/29/2019 / Date of Encounter: 10/31/2019, 9:02 AM   Subjective: Patient still mildly short of breath with and without physical activity but O2 sat is normal with no evidence of rails or pulmonary edema.  Elevated troponin consistent with non-ST elevation myocardial infarction Telemetry showing atrial fibrillation with bundle branch block with variable heart rate but no evidence of significant long RR intervals or heart block Cardiac catheterization showing moderate segmental LV systolic dysfunction with ejection fraction of 35% with 6 akinesis of the posterior lateral wall Native LAD occluded Native circumflex with stent patent to PDA and obtuse marginal 1 with severe complicated stenosis of 16% Nondominant right with minimal atherosclerosis Graft to ramus patent Graft to LAD patent Echocardiogram showing moderate LV systolic dysfunction with ejection fraction of 30% with inferior posterior lateral myocardial infarction Review of Systems: Positive for: Shortness of breath Negative for: Vision change, hearing change, syncope, dizziness, nausea, vomiting,diarrhea, bloody stool, stomach pain, cough, congestion, diaphoresis, urinary frequency, urinary pain,skin lesions, skin rashes Others previously listed  Objective: Telemetry: Atrial fibrillation with controlled ventricular rate and bundle branch block Physical Exam: Blood pressure 129/74, pulse 66, temperature 97.8 F (36.6 C), temperature source Oral, resp. rate 20, height 5\' 10"  (1.778 m), weight 96.2 kg, SpO2 100 %. Body mass index is 30.42 kg/m. General: Well developed, well nourished, in no acute distress. Head: Normocephalic, atraumatic, sclera non-icteric, no xanthomas, nares are without discharge. Neck: No apparent masses Lungs: Normal respirations with no wheezes, no rhonchi, no rales , no crackles   Heart: irregular rate and rhythm,  normal S1 S2, no murmur, no rub, no gallop, PMI is normal size and placement, carotid upstroke normal without bruit, jugular venous pressure normal Abdomen: Soft, non-tender, non-distended with normoactive bowel sounds. No hepatosplenomegaly. Abdominal aorta is normal size without bruit Extremities: Trace edema, no clubbing, no cyanosis, no ulcers,  Peripheral: 2+ radial, 2+ femoral, 2+ dorsal pedal pulses Neuro: Alert and oriented. Moves all extremities spontaneously. Psych:  Responds to questions appropriately with a normal affect.   Intake/Output Summary (Last 24 hours) at 10/31/2019 0902 Last data filed at 10/31/2019 0409 Gross per 24 hour  Intake 352.11 ml  Output 4150 ml  Net -3797.89 ml    Inpatient Medications:  . apixaban  5 mg Oral BID  . aspirin EC  81 mg Oral Daily  . atorvastatin  80 mg Oral Daily  . azithromycin  500 mg Oral Daily  . cefdinir  300 mg Oral Q12H  . gabapentin  300 mg Oral QHS  . insulin aspart  0-5 Units Subcutaneous QHS  . sodium chloride flush  3 mL Intravenous Q12H   Infusions:  . sodium chloride 250 mL (10/31/19 0132)    Labs: Recent Labs    10/29/19 0301 10/30/19 0319 10/31/19 0500  NA 137 138  --   K 4.7 4.1  --   CL 102 105  --   CO2 21* 22  --   GLUCOSE 215* 188*  --   BUN 30* 38*  --   CREATININE 1.38* 1.81* 1.75*  CALCIUM 8.7* 8.4*  --    Recent Labs    10/29/19 0016 10/29/19 0301  AST 38 37  ALT 18 19  ALKPHOS 80 75  BILITOT 1.5* 1.8*  PROT 7.5 7.2  ALBUMIN 3.6 3.5   Recent Labs    10/29/19 0301 10/30/19 0319 10/31/19 0500  WBC 14.4* 10.5 10.9*  NEUTROABS 12.6*  --  8.4*  HGB 13.4 12.8* 13.2  HCT 40.1 39.7 41.0  MCV 92.2 95.9 97.6  PLT 226 195 210   Recent Labs    10/29/19 0016  CKTOTAL 465*   Invalid input(s): POCBNP Recent Labs    10/29/19 0301  HGBA1C 6.5*     Weights: Filed Weights   10/30/19 0714 10/30/19 1556 10/31/19 0409  Weight: 90.7 kg 96.5 kg 96.2 kg     Radiology/Studies:  Dg  Chest 1 View  Result Date: 10/31/2019 CLINICAL DATA:  Shortness of breath EXAM: CHEST  1 VIEW COMPARISON:  October 30, 2019 FINDINGS: There is persistent consolidation in the lateral right base with small right pleural effusion. The lungs elsewhere are clear. There is cardiomegaly with pulmonary vascularity normal. No adenopathy. Patient is status post median sternotomy. The two superior most sternal wires are fractured, unchanged. No adenopathy. No bone lesions. IMPRESSION: Lateral right base airspace consolidation with small right pleural effusion. Lungs elsewhere clear. Stable cardiomegaly. Stable postoperative changes. Electronically Signed   By: Bretta BangWilliam  Woodruff III M.D.   On: 10/31/2019 07:26   Ct Head Wo Contrast  Result Date: 10/29/2019 CLINICAL DATA:  Fall EXAM: CT HEAD WITHOUT CONTRAST TECHNIQUE: Contiguous axial images were obtained from the base of the skull through the vertex without intravenous contrast. COMPARISON:  None. FINDINGS: Brain: No evidence of acute territorial infarction, hemorrhage, hydrocephalus,extra-axial collection or mass lesion/mass effect. There is dilatation the ventricles and sulci consistent with age-related atrophy. Low-attenuation changes in the deep white matter consistent with small vessel ischemia. 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 Skull: The skull is intact. No fracture or focal lesion identified. Sinuses/Orbits: The visualized paranasal sinuses and mastoid air cells are clear. The orbits and globes intact. Other: None IMPRESSION: No acute intracranial abnormality. 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 Electronically Signed   By: Jonna ClarkBindu  Avutu M.D.   On: 10/29/2019 01:29   Ct Chest Wo Contrast  Result Date: 10/29/2019 CLINICAL DATA:  Shortness of breath EXAM: CT CHEST WITHOUT CONTRAST  TECHNIQUE: Multidetector CT imaging of the chest was performed following the standard protocol without IV contrast. COMPARISON:  None. FINDINGS: Cardiovascular: The heart is enlarged. No pericardial effusion. Surgical changes from coronary artery bypass surgery are noted. Extensive three-vessel coronary artery calcifications. Moderate aortic calcifications but no aneurysm. Mediastinum/Nodes: Small scattered mediastinal and hilar lymph nodes but no mass or overt adenopathy. Lungs/Pleura: There is a moderate-size right pleural effusion with overlying atelectasis. There is also loculated pleural fluid anteriorly with overlying right middle lobe atelectasis which has the appearance of rounded atelectasis. There is also a small left pleural effusion with minimal overlying atelectasis. I do not see any findings for pulmonary edema or worrisome pulmonary nodules. Emphysematous changes are noted. Upper Abdomen: No significant upper abdominal findings. Musculoskeletal: No chest wall mass, supraclavicular or axillary adenopathy. The thyroid gland is grossly normal. The bony structures are intact. IMPRESSION: 1. Moderate-sized right pleural effusion with overlying atelectasis. Anterior loculated component is noted with overlying probable rounded atelectasis. I would recommend a follow-up chest CT in 3 months to make sure this resolves or is at least stable. 2. No definite pulmonary mass or worrisome pulmonary nodules. 3. Small left effusion with minimal overlying atelectasis. 4. Cardiac enlargement. 5. 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 Aortic Atherosclerosis (ICD10-I70.0) and Emphysema (ICD10-J43.9). Electronically Signed   By: Demetrius CharityP.  Gallerani M.D.   On: 10/29/2019 07:23   Dg Chest Port 1 View  Result Date: 10/30/2019 CLINICAL DATA:  Sepsis, former smoker, CHF, coronary artery disease, atrial fibrillation EXAM: PORTABLE CHEST 1 VIEW COMPARISON:   Portable exam 1617 hours compared to 10/29/2019 FINDINGS: Enlargement of cardiac silhouette post median sternotomy. Cranial 2 sternotomy wires are fractured. Mediastinal contours and pulmonary vascularity normal. Bibasilar atelectasis versus infiltrate and small RIGHT pleural effusion. Upper lungs clear. No pneumothorax or acute osseous findings. IMPRESSION: Enlargement of cardiac silhouette. Bibasilar atelectasis versus infiltrate and small RIGHT pleural effusion. Little interval change. Electronically Signed   By: Ulyses Southward M.D.   On: 10/30/2019 17:39   Dg Chest Portable 1 View  Result Date: 10/29/2019 CLINICAL DATA:  Hypoglycemia EXAM: PORTABLE CHEST 1 VIEW COMPARISON:  May 26, 2019 FINDINGS: The cardiomediastinal silhouette is unchanged from prior exam. Overlying median sternotomy wires. There is a small right pleural effusion, as on the prior exam. There is mild prominence of the central pulmonary vasculature. No acute osseous abnormality. IMPRESSION: Small right pleural effusion as on prior exam. Findings suggestive of mild pulmonary vascular congestion. Electronically Signed   By: Jonna Clark M.D.   On: 10/29/2019 00:37     Assessment and Recommendation  73 y.o. male with acute syncopal episode of unknown etiology with acute non-ST elevation myocardial infarction with elevated troponin and chronically abnormal EKG with worsening LV systolic dysfunction from 48% down to 30 to 35% and akinesis of the posterior lateral wall with cardiac catheterization suggesting obtuse marginal artery stenosis as culprit not amenable to PCI and stent placement with patent graft to ramus LAD and patent native PDA and right coronary artery 1.  No further cardiac intervention at this time due to acute non-ST elevation myocardial infarction with no artery amenable to PCI and stent placement 2.  Continue medication management for further risk reduction cardiovascular event including high intensity cholesterol therapy  aspirin and antihypertensives including a possible ACE inhibitor although currently with worsening GFR would abstain from ACE inhibitor 3.  No use of beta-blocker at this time due to significant bradycardia and concerns for sick sinus syndrome which may have been involved with episode of syncope although currently no evidence of telemetry changes 4.  Continue Lasix for extremity edema pulmonary edema congestive heart failure watching for worsening chronic kidney disease 5.  Begin cardiac rehab 6.  Further evaluation of other syncopal causes including carotid atherosclerosis 7.  Reinstatement of anticoagulation for further risk reduction in stroke with atrial fibrillation with Eliquis 5 mg twice per day.  Patient will go on single antiplatelet therapy with aspirin and not triple therapy due to concerns of bleeding complications despite recent non-ST elevation myocardial infarction 8.  If ambulating well with no evidence of significant new symptoms would consider discharge home with further assessment and medication management adjustments next week  Signed, Arnoldo Hooker M.D. FACC

## 2019-10-31 NOTE — Progress Notes (Signed)
Patient reports to this RN that he believes that he had a stroke. Reports that he has some weakness in his left arm and some "weird things in my head". NIH 0. VSS. Patient noted to be using accessory muscles when breathing. NP Ouma notified. NP at bedside for evaluation. Orders to follow. Will continue to monitor.   Iran Sizer M

## 2019-10-31 NOTE — Progress Notes (Addendum)
    BRIEF OVERNIGHT PROGRESS REPORT  SUBJECTIVE: Patient reports transient episode of confusion and mental fogginess. Per nursing staff, he also mentioned left arm weakness.  OBJECTIVE: On arrival to the bedside, he was afebrile with blood pressure 224/114 mm Hg and pulse rate 81 beats/min. There were no focal neurological deficits; he was alert and oriented x4, and he did not demonstrate any memory deficits. He report that he has left elbow and shoulder pain but this appears to be at baseline. No focal motor or sensory deficit appreciated on exam.    ASSESSMENT:73 y.o. male with medical history significant for COPD, atrial fibrillation, chronic systolic CHF with EF of 09%, type 2 diabetes mellitus, obesity (BMI 30.4), CKD stage III.  He was brought to the hospital because of altered mental status/lethargy  PLAN: 1. Transient episode of confusion - No focal neurological deficit noted on exam. He is however noted to be using his abdominal muscle to breath.  - Low suspicion for TIA/Stroke given no focality on exam and symptoms resolved. Defer imaging given he is already on Eliquis for afib and imaging unlikely to change management. Can consider imaging if continues to have symptoms - Check ABGs to eval for hypoxia in a patient with hx of COPD - Repeat chest xray - Check Labs CBC, BMP, Mag and BNP     Rufina Falco, DNP, CCRN, FNP-C [facility] Nurse Practitioner Between 7pm to [place] - Pager [phone]  After 7am go to [email or web address] - [email or web address]  [facility] [employer]  [phone]

## 2019-10-31 NOTE — Plan of Care (Signed)
Problem: Education: Goal: Knowledge of General Education information will improve Description: Including pain rating scale, medication(s)/side effects and non-pharmacologic comfort measures 10/31/2019 1602 by Phylis Bougie, RN Outcome: Progressing 10/31/2019 1602 by Phylis Bougie, RN Outcome: Progressing   Problem: Health Behavior/Discharge Planning: Goal: Ability to manage health-related needs will improve 10/31/2019 1602 by Phylis Bougie, RN Outcome: Progressing 10/31/2019 1602 by Phylis Bougie, RN Outcome: Progressing   Problem: Clinical Measurements: Goal: Ability to maintain clinical measurements within normal limits will improve 10/31/2019 1602 by Phylis Bougie, RN Outcome: Not Progressing Note: Episode of hypoglycemia at lunchtime; resolved with juice PO. 10/31/2019 1602 by Phylis Bougie, RN Outcome: Progressing Goal: Will remain free from infection 10/31/2019 1602 by Phylis Bougie, RN Outcome: Progressing 10/31/2019 1602 by Phylis Bougie, RN Outcome: Progressing Goal: Diagnostic test results will improve 10/31/2019 1602 by Phylis Bougie, RN Outcome: Progressing 10/31/2019 1602 by Phylis Bougie, RN Outcome: Progressing Goal: Respiratory complications will improve 10/31/2019 1602 by Phylis Bougie, RN Outcome: Progressing 10/31/2019 1602 by Phylis Bougie, RN Outcome: Progressing Goal: Cardiovascular complication will be avoided 10/31/2019 1602 by Phylis Bougie, RN Outcome: Progressing 10/31/2019 1602 by Phylis Bougie, RN Outcome: Progressing   Problem: Activity: Goal: Risk for activity intolerance will decrease 10/31/2019 1602 by Phylis Bougie, RN Outcome: Not Progressing Note: Declined working with PT today. 10/31/2019 1602 by Phylis Bougie, RN Outcome: Progressing   Problem: Nutrition: Goal: Adequate nutrition will be maintained 10/31/2019 1602 by Phylis Bougie,  RN Outcome: Progressing 10/31/2019 1602 by Phylis Bougie, RN Outcome: Progressing   Problem: Coping: Goal: Level of anxiety will decrease 10/31/2019 1602 by Phylis Bougie, RN Outcome: Progressing 10/31/2019 1602 by Phylis Bougie, RN Outcome: Progressing   Problem: Elimination: Goal: Will not experience complications related to bowel motility 10/31/2019 1602 by Phylis Bougie, RN Outcome: Progressing 10/31/2019 1602 by Phylis Bougie, RN Outcome: Progressing Goal: Will not experience complications related to urinary retention 10/31/2019 1602 by Phylis Bougie, RN Outcome: Progressing 10/31/2019 1602 by Phylis Bougie, RN Outcome: Progressing   Problem: Pain Managment: Goal: General experience of comfort will improve 10/31/2019 1602 by Phylis Bougie, RN Outcome: Progressing 10/31/2019 1602 by Phylis Bougie, RN Outcome: Progressing   Problem: Safety: Goal: Ability to remain free from injury will improve 10/31/2019 1602 by Phylis Bougie, RN Outcome: Progressing 10/31/2019 1602 by Phylis Bougie, RN Outcome: Progressing   Problem: Skin Integrity: Goal: Risk for impaired skin integrity will decrease 10/31/2019 1602 by Phylis Bougie, RN Outcome: Progressing 10/31/2019 1602 by Phylis Bougie, RN Outcome: Progressing   Problem: Education: Goal: Knowledge of General Education information will improve Description: Including pain rating scale, medication(s)/side effects and non-pharmacologic comfort measures 10/31/2019 1602 by Phylis Bougie, RN Outcome: Progressing 10/31/2019 1602 by Phylis Bougie, RN Outcome: Progressing   Problem: Health Behavior/Discharge Planning: Goal: Ability to manage health-related needs will improve 10/31/2019 1602 by Phylis Bougie, RN Outcome: Progressing 10/31/2019 1602 by Phylis Bougie, RN Outcome: Progressing   Problem: Clinical Measurements: Goal: Ability to  maintain clinical measurements within normal limits will improve 10/31/2019 1602 by Phylis Bougie, RN Outcome: Progressing 10/31/2019 1602 by Phylis Bougie, RN Outcome: Progressing Goal: Will remain free from infection 10/31/2019 1602 by Phylis Bougie, RN Outcome: Progressing 10/31/2019 1602 by Phylis Bougie, RN Outcome: Progressing Goal: Diagnostic test results will improve 10/31/2019 1602 by Phylis Bougie, RN Outcome:  Progressing 10/31/2019 1602 by Aubery Lapping, RN Outcome: Progressing Goal: Respiratory complications will improve 10/31/2019 1602 by Aubery Lapping, RN Outcome: Progressing 10/31/2019 1602 by Aubery Lapping, RN Outcome: Progressing Goal: Cardiovascular complication will be avoided 10/31/2019 1602 by Aubery Lapping, RN Outcome: Progressing 10/31/2019 1602 by Aubery Lapping, RN Outcome: Progressing   Problem: Activity: Goal: Risk for activity intolerance will decrease 10/31/2019 1602 by Aubery Lapping, RN Outcome: Progressing 10/31/2019 1602 by Aubery Lapping, RN Outcome: Progressing   Problem: Nutrition: Goal: Adequate nutrition will be maintained 10/31/2019 1602 by Aubery Lapping, RN Outcome: Progressing 10/31/2019 1602 by Aubery Lapping, RN Outcome: Progressing   Problem: Coping: Goal: Level of anxiety will decrease 10/31/2019 1602 by Aubery Lapping, RN Outcome: Progressing 10/31/2019 1602 by Aubery Lapping, RN Outcome: Progressing   Problem: Elimination: Goal: Will not experience complications related to bowel motility 10/31/2019 1602 by Aubery Lapping, RN Outcome: Progressing 10/31/2019 1602 by Aubery Lapping, RN Outcome: Progressing Goal: Will not experience complications related to urinary retention 10/31/2019 1602 by Aubery Lapping, RN Outcome: Progressing 10/31/2019 1602 by Aubery Lapping, RN Outcome: Progressing   Problem: Pain Managment: Goal:  General experience of comfort will improve 10/31/2019 1602 by Aubery Lapping, RN Outcome: Progressing 10/31/2019 1602 by Aubery Lapping, RN Outcome: Progressing   Problem: Safety: Goal: Ability to remain free from injury will improve 10/31/2019 1602 by Aubery Lapping, RN Outcome: Progressing 10/31/2019 1602 by Aubery Lapping, RN Outcome: Progressing   Problem: Skin Integrity: Goal: Risk for impaired skin integrity will decrease 10/31/2019 1602 by Aubery Lapping, RN Outcome: Progressing 10/31/2019 1602 by Aubery Lapping, RN Outcome: Progressing   Problem: Education: Goal: Understanding of CV disease, CV risk reduction, and recovery process will improve 10/31/2019 1602 by Aubery Lapping, RN Outcome: Progressing 10/31/2019 1602 by Aubery Lapping, RN Outcome: Progressing Goal: Individualized Educational Video(s) 10/31/2019 1602 by Aubery Lapping, RN Outcome: Progressing 10/31/2019 1602 by Aubery Lapping, RN Outcome: Progressing   Problem: Activity: Goal: Ability to return to baseline activity level will improve 10/31/2019 1602 by Aubery Lapping, RN Outcome: Progressing 10/31/2019 1602 by Aubery Lapping, RN Outcome: Progressing   Problem: Cardiovascular: Goal: Ability to achieve and maintain adequate cardiovascular perfusion will improve 10/31/2019 1602 by Aubery Lapping, RN Outcome: Progressing 10/31/2019 1602 by Aubery Lapping, RN Outcome: Progressing Goal: Vascular access site(s) Level 0-1 will be maintained 10/31/2019 1602 by Aubery Lapping, RN Outcome: Progressing 10/31/2019 1602 by Aubery Lapping, RN Outcome: Progressing   Problem: Health Behavior/Discharge Planning: Goal: Ability to safely manage health-related needs after discharge will improve 10/31/2019 1602 by Aubery Lapping, RN Outcome: Progressing 10/31/2019 1602 by Aubery Lapping, RN Outcome: Progressing

## 2019-10-31 NOTE — Evaluation (Signed)
Occupational Therapy Evaluation Patient Details Name: Jeremiah Chapman MRN: [id number] DOB: 1946-06-09 Today's Date: 10/31/2019    History of Present Illness Pt. is a 73 y.o. male who was admitted to [facility] with Syncopal Episode with Acute Non ST Elevation MI, Hypogycemia. Pt. had a Cardiac Catheterization with Stent placement on 12/01. PMHx includes: AFib, CAD, CHF, DM, Gout, Hypercholesterolemia, HTN.   Clinical Impression   Pt. presents with weakness, limited activity tolerance, and limited functional mobility which limits his ability to complete basic ADL and IADL functioning. Pt. Resides at home alone. Pt. Reports being  independent with ADLs, and IADL functioning including: medication management, and driving. Pt. has housecleaning services once every other week. Pt. Eats out 1 meal a day, and is able to perform light meal prep for the other 2 meals. Pt. education was provided about A/E use for ADL functioning, energy conservation, work simplification, and work simplification techniques. Pt.'s SO2 98, HR 69 bpms on 3LO2. Pt. Could benefit from OT services for ADL training, A/E training, and pt. education about energy conservation, work simplification techniques, home modification, and DME. Pt. Will benefit from follow-up [address] services upon discharge.    Follow Up Recommendations  Home health OT    Equipment Recommendations       Recommendations for Other Services       Precautions / Restrictions Precautions Precautions: None Restrictions Weight Bearing Restrictions: No      Mobility Bed Mobility               General bed mobility comments: Deferred  Transfers                 General transfer comment: Deferred    Balance                                           ADL either performed or assessed with clinical judgement   ADL Overall ADL's : Needs assistance/impaired Eating/Feeding: Set up;Independent   Grooming: Set up;Independent    Upper Body Bathing: Set up;Minimal assistance   Lower Body Bathing: Set up;Moderate assistance   Upper Body Dressing : Set up;Minimal assistance   Lower Body Dressing: Set up;Moderate assistance                       Vision Patient Visual Report: No change from baseline       Perception     Praxis      Pertinent Vitals/Pain Pain Assessment: No/denies pain     Hand Dominance Right   Extremity/Trunk Assessment Upper Extremity Assessment Upper Extremity Assessment: Generalized weakness           Communication Communication Communication: No difficulties   Cognition Arousal/Alertness: Awake/alert Behavior During Therapy: WFL for tasks assessed/performed;Flat affect                                       General Comments       Exercises     Shoulder Instructions      Home Living Family/patient expects to be discharged to:: Private residence   Available Help at Discharge: Neighbor Type of Home: House Home Access: Level entry     Home Layout: One level     Bathroom Shower/Tub: Walk-in shower;Door  Home Equipment: Gilmer Mor - single point;Shower seat - built in          Prior Functioning/Environment Level of Independence: Independent;Needs assistance    ADL's / Homemaking Assistance Needed: Pt. reports independence with ADLs, IADLs, Driving, medication management, eats out for 1 meal everday, and is able to prepare light meals for the other meals. Pt. has housecleaning help once every 2 weeks.            OT Problem List: Decreased strength;Decreased knowledge of use of DME or AE;Decreased activity tolerance;Pain      OT Treatment/Interventions: Self-care/ADL training;Therapeutic exercise;Patient/family education;Energy conservation;DME and/or AE instruction;Therapeutic activities    OT Goals(Current goals can be found in the care plan section) Acute Rehab OT Goals Patient Stated Goal: To get better OT Goal  Formulation: With patient Potential to Achieve Goals: Good  OT Frequency: Min 2X/week   Barriers to D/C:            Co-evaluation              AM-PAC OT "6 Clicks" Daily Activity     Outcome Measure Help from another person eating meals?: None Help from another person taking care of personal grooming?: None Help from another person toileting, which includes using toliet, bedpan, or urinal?: A Little Help from another person bathing (including washing, rinsing, drying)?: A Lot Help from another person to put on and taking off regular upper body clothing?: A Little Help from another person to put on and taking off regular lower body clothing?: A Lot 6 Click Score: 18   End of Session    Activity Tolerance: Patient tolerated treatment well Patient left: in bed  OT Visit Diagnosis: Muscle weakness (generalized) (M62.81)                Time: 1517-6160 OT Time Calculation (min): 27 min Charges:  OT General Charges $OT Visit: 1 Visit OT Evaluation $OT Eval Moderate Complexity: 1 Mod  Olegario Messier, MS, OTR/L  Olegario Messier 10/31/2019, 11:45 AM

## 2019-10-31 NOTE — Progress Notes (Signed)
Progress Note    Jeremiah Chapman  JAS:[id number] DOB: 25-Mar-1946  DOA: 10/29/2019 PCP: Dorothey Baseman, MD      Brief Narrative:    Medical records reviewed and are as summarized below:  Jeremiah Chapman is an 74 y.o. male with medical history significant for COPD, atrial fibrillation, chronic systolic CHF with EF of 40%, type 2 diabetes mellitus, obesity (BMI 30.4), CKD stage III.  He was brought to the hospital because of altered mental status/lethargy.  Apparently, he was found on his kitchen floor by a neighbor.  Reportedly, his blood glucose level was 50 when EMS arrived at the scene.  In the emergency room, his blood glucose was 13 and he was hypothermic and bradycardic.  In the ED, he also had complaints of increasing shortness of breath.      Assessment/Plan:   Principal Problem:   Hypoglycemia Active Problems:   Sepsis (HCC)   Acute on chronic systolic CHF (congestive heart failure) (HCC)   NSTEMI (non-ST elevated myocardial infarction) (HCC)   Body mass index is 30.42 kg/m.    Acute exacerbation of chronic systolic CHF: BNP was 1,511.   Patient was treated with IV Lasix. Currently with worsening renal function I will be holding off on further aggressive diuresis and transition back to home Lasix 20 mg twice daily.  Acute non-ST elevation MI: Continue aspirin and Lipitor.  Metoprolol on hold because of bradycardia.  Resume home dose of Eliquis.  S/p left heart cath with report as listed below  "Patent graft to ramus (obtuse marginal) Patent LIMA graft to LAD Moderate atherosclerosis of right coronary artery Patent stent of proximal left circumflex artery Patent native left sided PDA Severe stenosis of obtuse marginal 2 with complicated takeoff and stenoses with collaterals from grafted obtuse marginal and/or ramus which appears to be the culprit of non-ST elevation myocardial infarction  Plan No further cardiac intervention at this time due to  collateral flow to obtuse marginal with infarct High intensity cholesterol therapy Avoid beta-blocker due to bradycardia and possible syncope Avoid ACE inhibitor due to severe chronic kidney disease stage iii Begin ambulation and cardiac rehabilitation and further evaluation of other possible syncopal episode issues including carotid atherosclerosis"    Probable sepsis with probable pneumonia as the likely etiology: Suspected his presenting clinical features were most likely from acute MI complicated by acute heart failure exacerbation.  Repeat chest x-ray shows no significant pneumonia and no significant CHF as well. To transition to oral antibiotics.  Follow-up on blood cultures.  So far no growth.  Type 2 diabetes mellitus: Hypoglycemia has resolved.  Glimepiride, Metformin and degludec have been held.  NovoLog as needed for hyperglycemia.  Hemoglobin A1c 6.5  Metabolic encephalopathy: Resolved  CKD stage III b: Creatinine is worse today but it's still around his baseline.  Patient received IV fluids because he underwent left heart catheterization today.  Monitor BMP.  Chronic atrial fibrillation: Resume Eliquis.  Of note, patient said that he was taking Eliquis at home up until this admission.  COPD: Stable  We will provide incentive spirometry to treat patient's reported shortness of breath.  Examination does not reveal any acute abnormality nor the chest x-ray findings suggest any acute abnormality.   Family Communication/Anticipated D/C date and plan/Code Status   DVT prophylaxis: Resume Eliquis Code Status: Full code Family Communication: Plan discussed with the patient Disposition Plan: Home in 2 to 3 days depending on clinical improvement.      Subjective:  Complains of cough as well as shortness of breath.  He thinks that he is a lot of fluid buildup on his lung.  No nausea or vomiting.   Objective:    Vitals:   10/31/19 0409 10/31/19 0635 10/31/19 0752 10/31/19  1613  BP: 133/60 136/76 129/74 (!) 157/68  Pulse: 68 63 66 68  Resp: 20 (!) 21 20 20   Temp: (!) 97.4 F (36.3 C) 98.1 F (36.7 C) 97.8 F (36.6 C) 98.5 F (36.9 C)  TempSrc: Oral Oral Oral Oral  SpO2: 100% 100% 100% 95%  Weight: 96.2 kg     Height:        Intake/Output Summary (Last 24 hours) at 10/31/2019 1918 Last data filed at 10/31/2019 1833 Gross per 24 hour  Intake 202.11 ml  Output 3075 ml  Net -2872.89 ml   Filed Weights   10/30/19 0714 10/30/19 1556 10/31/19 0409  Weight: 90.7 kg 96.5 kg 96.2 kg    Exam:  GEN: NAD SKIN: Multiple bruises and ecchymoses on bilateral upper extremities which were present on admission. EYES: no pallor or icterus ENT: MMM CV: RRR PULM: CTA B ABD: soft, obese, NT, +BS CNS: AAO x 3, non focal EXT: Left leg edema improved.  No tenderness.    Data Reviewed:   I have personally reviewed following labs and imaging studies:  Labs: Labs show the following:   Basic Metabolic Panel: Recent Labs  Lab 10/29/19 0016 10/29/19 0301 10/30/19 0319 10/31/19 0500  NA 138 137 138 139  138  K 3.6 4.7 4.1 4.3  4.1  CL 102 102 105 105  105  CO2 21* 21* 22 25  26   GLUCOSE 147* 215* 188* 112*  111*  BUN 27* 30* 38* 35*  36*  CREATININE 1.50* 1.38* 1.81* 1.69*  1.68*  1.75*  CALCIUM 8.8* 8.7* 8.4* 8.4*  8.4*   GFR Estimated Creatinine Clearance: 42.5 mL/min (A) (by C-G formula based on SCr of 1.75 mg/dL (H)). Liver Function Tests: Recent Labs  Lab 10/29/19 0016 10/29/19 0301  AST 38 37  ALT 18 19  ALKPHOS 80 75  BILITOT 1.5* 1.8*  PROT 7.5 7.2  ALBUMIN 3.6 3.5   No results for input(s): LIPASE, AMYLASE in the last 168 hours. No results for input(s): AMMONIA in the last 168 hours. Coagulation profile Recent Labs  Lab 10/29/19 0301  INR 1.3*    CBC: Recent Labs  Lab 10/29/19 0016 10/29/19 0301 10/30/19 0319 10/31/19 0500  WBC 17.6* 14.4* 10.5 10.9*  NEUTROABS  --  12.6*  --  8.4*  HGB 14.4 13.4 12.8* 13.2   HCT 43.3 40.1 39.7 41.0  MCV 93.1 92.2 95.9 97.6  PLT 243 226 195 210   Cardiac Enzymes: Recent Labs  Lab 10/29/19 0016  CKTOTAL 465*   BNP (last 3 results) No results for input(s): PROBNP in the last 8760 hours. CBG: Recent Labs  Lab 10/31/19 0412 10/31/19 0753 10/31/19 1123 10/31/19 1155 10/31/19 1614  GLUCAP 90 75 62* 95 130*   D-Dimer: No results for input(s): DDIMER in the last 72 hours. Hgb A1c: Recent Labs    10/29/19 0301  HGBA1C 6.5*   Lipid Profile: No results for input(s): CHOL, HDL, LDLCALC, TRIG, CHOLHDL, LDLDIRECT in the last 72 hours. Thyroid function studies: Recent Labs    10/29/19 0301  TSH 1.547   Anemia work up: No results for input(s): VITAMINB12, FOLATE, FERRITIN, TIBC, IRON, RETICCTPCT in the last 72 hours. Sepsis Labs: Recent Labs  Lab 10/29/19  0016 10/29/19 0106 10/29/19 0301 10/30/19 0319 10/31/19 0500  WBC 17.6*  --  14.4* 10.5 10.9*  LATICACIDVEN  --  6.6* 3.9*  --   --     Microbiology Recent Results (from the past 240 hour(s))  Blood Culture (routine x 2)     Status: None (Preliminary result)   Collection Time: 10/29/19  1:06 AM   Specimen: BLOOD  Result Value Ref Range Status   Specimen Description BLOOD RIGHT ARM  Final   Special Requests   Final    BOTTLES DRAWN AEROBIC AND ANAEROBIC Blood Culture adequate volume   Culture   Final    NO GROWTH 1 DAY Performed at [facility] [address] [place], [place] [address]    Report Status PENDING  Incomplete  Blood Culture (routine x 2)     Status: None (Preliminary result)   Collection Time: 10/29/19  1:06 AM   Specimen: BLOOD  Result Value Ref Range Status   Specimen Description BLOOD LEFT ARM  Final   Special Requests   Final    BOTTLES DRAWN AEROBIC AND ANAEROBIC Blood Culture adequate volume   Culture   Final    NO GROWTH 1 DAY Performed at [facility], [address] [place], [place] [address]    Report Status PENDING  Incomplete   SARS CORONAVIRUS 2 (TAT 6-24 HRS) Nasopharyngeal Nasopharyngeal Swab     Status: None   Collection Time: 10/29/19  1:36 AM   Specimen: Nasopharyngeal Swab  Result Value Ref Range Status   SARS Coronavirus 2 NEGATIVE NEGATIVE Final    Comment: (NOTE) SARS-CoV-2 target nucleic acids are NOT DETECTED. The SARS-CoV-2 RNA is generally detectable in upper and lower respiratory specimens during the acute phase of infection. Negative results do not preclude SARS-CoV-2 infection, do not rule out co-infections with other pathogens, and should not be used as the sole basis for treatment or other patient management decisions. Negative results must be combined with clinical observations, patient history, and epidemiological information. The expected result is Negative. Fact Sheet for Patients: HairSlick.no Fact Sheet for Healthcare Providers: quierodirigir.com This test is not yet approved or cleared by the [place] FDA and  has been authorized for detection and/or diagnosis of SARS-CoV-2 by FDA under an Emergency Use Authorization (EUA). This EUA will remain  in effect (meaning this test can be used) for the duration of the COVID-19 declaration under Section 56 4(b)(1) of the Act, 21 U.S.C. section 360bbb-3(b)(1), unless the authorization is terminated or revoked sooner. Performed at [facility], [address] [place], [place] [address]   Urine culture     Status: None   Collection Time: 10/29/19  7:04 AM   Specimen: In/Out Cath Urine  Result Value Ref Range Status   Specimen Description   Final    IN/OUT CATH URINE Performed at [facility], [address], [place], [place] [address]    Special Requests   Final    NONE Performed at [facility], [address], [place], [place] [address]    Culture   Final    NO GROWTH Performed at [facility], [address] [place], [place] [address]     Report Status 10/30/2019 FINAL  Final    Procedures and diagnostic studies:  Dg Chest 1 View  Result Date: 10/31/2019 CLINICAL DATA:  Shortness of breath EXAM: CHEST  1 VIEW COMPARISON:  October 30, 2019 FINDINGS: There is persistent consolidation in the lateral  right base with small right pleural effusion. The lungs elsewhere are clear. There is cardiomegaly with pulmonary vascularity normal. No adenopathy. Patient is status post median sternotomy. The two superior most sternal wires are fractured, unchanged. No adenopathy. No bone lesions. IMPRESSION: Lateral right base airspace consolidation with small right pleural effusion. Lungs elsewhere clear. Stable cardiomegaly. Stable postoperative changes. Electronically Signed   By: Bretta BangWilliam  Woodruff III M.D.   On: 10/31/2019 07:26   Dg Chest Port 1 View  Result Date: 10/30/2019 CLINICAL DATA:  Sepsis, former smoker, CHF, coronary artery disease, atrial fibrillation EXAM: PORTABLE CHEST 1 VIEW COMPARISON:  Portable exam 1617 hours compared to 10/29/2019 FINDINGS: Enlargement of cardiac silhouette post median sternotomy. Cranial 2 sternotomy wires are fractured. Mediastinal contours and pulmonary vascularity normal. Bibasilar atelectasis versus infiltrate and small RIGHT pleural effusion. Upper lungs clear. No pneumothorax or acute osseous findings. IMPRESSION: Enlargement of cardiac silhouette. Bibasilar atelectasis versus infiltrate and small RIGHT pleural effusion. Little interval change. Electronically Signed   By: Ulyses SouthwardMark  Boles M.D.   On: 10/30/2019 17:39    Medications:   . apixaban  5 mg Oral BID  . aspirin EC  81 mg Oral Daily  . atorvastatin  80 mg Oral Daily  . azithromycin  500 mg Oral Daily  . cefdinir  300 mg Oral Q12H  . gabapentin  300 mg Oral QHS  . insulin aspart  0-5 Units Subcutaneous QHS  . sodium chloride flush  3 mL Intravenous Q12H   Continuous Infusions: . sodium chloride 250 mL (10/31/19 0132)     LOS: 2 days    Lynden OxfordPranav Shannyn Jankowiak  [facility]   *Please refer to [email or web address], password [email or web address] to get updated schedule on who will round on this patient, as hospitalists switch teams weekly. If 7PM-7AM, please contact night-coverage at [email or web address], password [email or web address] for any overnight needs.  10/31/2019, 7:18 PM

## 2019-10-31 NOTE — Progress Notes (Signed)
PT Cancellation Note  Patient Details Name: Jeremiah Chapman MRN: [id number] DOB: January 17, 1946   Cancelled Treatment:    Reason Eval/Treat Not Completed: Patient declined, no reason specified.  Chart reviewed.  Therapist called pt's nurse and nurse was in pt's room (and nurse asked pt if he was willing to participate in physical therapy):  nurse reporting pt declining PT at this time and pt was SOB at rest.  Will re-attempt PT evaluation at a later date/time.   Leitha Bleak, PT 10/31/19, 10:29 AM

## 2019-10-31 NOTE — Plan of Care (Signed)
  Problem: Clinical Measurements: Goal: Cardiovascular complication will be avoided Outcome: Progressing   Problem: Pain Managment: Goal: General experience of comfort will improve Outcome: Progressing   Problem: Safety: Goal: Ability to remain free from injury will improve Outcome: Progressing   Problem: Cardiovascular: Goal: Ability to achieve and maintain adequate cardiovascular perfusion will improve Outcome: Progressing Goal: Vascular access site(s) Level 0-1 will be maintained Outcome: Progressing   Problem: Clinical Measurements: Goal: Ability to maintain clinical measurements within normal limits will improve Outcome: Progressing Goal: Cardiovascular complication will be avoided Outcome: Progressing

## 2019-11-01 ENCOUNTER — Inpatient Hospital Stay: Payer: Medicare Other

## 2019-11-01 LAB — CBC
HCT: 44.2 % (ref 39.0–52.0)
Hemoglobin: 14.3 g/dL (ref 13.0–17.0)
MCH: 30.7 pg (ref 26.0–34.0)
MCHC: 32.4 g/dL (ref 30.0–36.0)
MCV: 94.8 fL (ref 80.0–100.0)
Platelets: 217 10*3/uL (ref 150–400)
RBC: 4.66 MIL/uL (ref 4.22–5.81)
RDW: 13.7 % (ref 11.5–15.5)
WBC: 12.4 10*3/uL — ABNORMAL HIGH (ref 4.0–10.5)
nRBC: 0 % (ref 0.0–0.2)

## 2019-11-01 LAB — BASIC METABOLIC PANEL
Anion gap: 10 (ref 5–15)
BUN: 33 mg/dL — ABNORMAL HIGH (ref 8–23)
CO2: 27 mmol/L (ref 22–32)
Calcium: 8.9 mg/dL (ref 8.9–10.3)
Chloride: 102 mmol/L (ref 98–111)
Creatinine, Ser: 1.6 mg/dL — ABNORMAL HIGH (ref 0.61–1.24)
GFR calc Af Amer: 48 mL/min — ABNORMAL LOW (ref >60)
GFR calc non Af Amer: 42 mL/min — ABNORMAL LOW (ref >60)
Glucose, Bld: 99 mg/dL (ref 70–99)
Potassium: 4.5 mmol/L (ref 3.5–5.1)
Sodium: 139 mmol/L (ref 135–145)

## 2019-11-01 LAB — PROCALCITONIN: Procalcitonin: 0.19 ng/mL

## 2019-11-01 LAB — LACTIC ACID, PLASMA
Lactic Acid, Venous: 1.1 mmol/L (ref 0.5–1.9)
Lactic Acid, Venous: 1.4 mmol/L (ref 0.5–1.9)

## 2019-11-01 LAB — GLUCOSE, CAPILLARY
Glucose-Capillary: 112 mg/dL — ABNORMAL HIGH (ref 70–99)
Glucose-Capillary: 63 mg/dL — ABNORMAL LOW (ref 70–99)
Glucose-Capillary: 180 mg/dL — ABNORMAL HIGH (ref 70–99)
Glucose-Capillary: 16 mg/dL — CL (ref 70–99)
Glucose-Capillary: 99 mg/dL (ref 70–99)
Glucose-Capillary: 112 mg/dL — ABNORMAL HIGH (ref 70–99)

## 2019-11-01 LAB — MAGNESIUM: Magnesium: 1.9 mg/dL (ref 1.7–2.4)

## 2019-11-01 MED ORDER — DEXTROSE 5 % IV SOLN
INTRAVENOUS | Status: DC
  Administered 2019-11-01: 18:00:00 via INTRAVENOUS

## 2019-11-01 MED ORDER — SODIUM CHLORIDE 0.9 % IV SOLN
2.0000 g | INTRAVENOUS | Status: DC
  Administered 2019-11-02 – 2019-11-03 (×2): 2 g via INTRAVENOUS
  Filled 2019-11-01 (×2): qty 2

## 2019-11-01 MED ORDER — POLYETHYLENE GLYCOL 3350 17 G PO PACK
17.0000 g | PACK | Freq: Every day | ORAL | Status: DC
  Administered 2019-11-01 – 2019-11-06 (×6): 17 g via ORAL
  Filled 2019-11-01 (×6): qty 1

## 2019-11-01 MED ORDER — LACTULOSE 10 GM/15ML PO SOLN
20.0000 g | Freq: Once | ORAL | Status: AC
  Administered 2019-11-01: 20 g via ORAL
  Filled 2019-11-01: qty 30

## 2019-11-01 MED ORDER — SENNOSIDES-DOCUSATE SODIUM 8.6-50 MG PO TABS: 1.0000 | ORAL_TABLET | Freq: Two times a day (BID) | ORAL | Status: DC

## 2019-11-01 MED ORDER — SENNOSIDES-DOCUSATE SODIUM 8.6-50 MG PO TABS
2.0000 | ORAL_TABLET | Freq: Two times a day (BID) | ORAL | Status: DC
  Administered 2019-11-01 – 2019-11-04 (×4): 2 via ORAL
  Filled 2019-11-01 (×7): qty 2

## 2019-11-01 NOTE — Progress Notes (Signed)
PT Cancellation Note  Patient Details Name: Jeremiah Chapman MRN: [id number] DOB: [birth year]   Cancelled Treatment:    Reason Eval/Treat Not Completed: Patient at procedure or test/unavailable.  PT consult received.  Chart reviewed.  Upon arrival to pt's room, lab present.  Therapist waited but once lab finished, staff present for imaging.  Then nurse reports that staff is on their way up to take pt off floor for procedure and then transport arrived for pt (order for thoracentesis noted in chart).  Will re-attempt PT evaluation at a later date/time.  Leitha Bleak, PT 11/01/19, 11:02 AM

## 2019-11-01 NOTE — Progress Notes (Signed)
  Hypoglycemic Event  CBG: 63  Treatment: 8 oz juice/soda  Symptoms: Hungry  Follow-up CBG: [id number] CBG Result:112  Possible Reasons for Event: Inadequate meal intake  Comments/MD notified: Glenford Bayley

## 2019-11-01 NOTE — Progress Notes (Signed)
Progress Note    Jeremiah MoralesKenneth D Chapman  ZOX:[id number] DOB: 03/25/1946  DOA: 10/29/2019 PCP: [name], David, MD      Brief Narrative:    Medical records reviewed and are as summarized below:  Jeremiah Chapman is an 73 y.o. male with medical history significant for COPD, atrial fibrillation, chronic systolic CHF with EF of 40%, type 2 diabetes mellitus, obesity (BMI 30.4), CKD stage III.  He was brought to the hospital because of altered mental status/lethargy.  Apparently, he was found on his kitchen floor by a neighbor.  Reportedly, his blood glucose level was 50 when EMS arrived at the scene.  In the emergency room, his blood glucose was 13 and he was hypothermic and bradycardic.  In the ED, he also had complaints of increasing shortness of breath.      Assessment/Plan:   Principal Problem:   Hypoglycemia Active Problems:   Sepsis (HCC)   Acute on chronic systolic CHF (congestive heart failure) (HCC)   NSTEMI (non-ST elevated myocardial infarction) (HCC)   Body mass index is 30.09 kg/m.    Acute exacerbation of chronic systolic CHF:  BNP was 1,511.   Patient was treated with IV Lasix. back to home Lasix 20 mg twice daily.  Acute non-ST elevation MI:  Continue aspirin and Lipitor.   Metoprolol on hold because of bradycardia.   Resume home dose of Eliquis.   S/p left heart cath with report as listed below "Patent graft to ramus (obtuse marginal) Patent LIMA graft to LAD Moderate atherosclerosis of right coronary artery Patent stent of proximal left circumflex artery Patent native left sided PDA Severe stenosis of obtuse marginal 2 with complicated takeoff and stenoses with collaterals from grafted obtuse marginal and/or ramus which appears to be the culprit of non-ST elevation myocardial infarction  Plan No further cardiac intervention at this time due to collateral flow to obtuse marginal with infarct High intensity cholesterol therapy Avoid beta-blocker due  to bradycardia and possible syncope Avoid ACE inhibitor due to severe chronic kidney disease stage iii Begin ambulation and cardiac rehabilitation and further evaluation of other possible syncopal episode issues including carotid atherosclerosis"   Right pleural effusion. Ultrasound paracentesis ordered on 11/01/2019 was canceled as the patient does not have significant amount of pleural fluid. Patient continues to remain short of breath although chest x-ray negative and normal pleural effusion that can be drained on ultrasound. Possibly abdominal etiology with constipation.  Contributory constipation.  Constipation. Bowel regimen initiated.  Probable sepsis with probable pneumonia as the likely etiology Suspected his presenting clinical features were most likely from acute MI complicated by acute heart failure exacerbation.   Repeat chest x-ray shows no significant pneumonia and no significant CHF as well. Follow-up on blood cultures.  So far no growth. Back on IV antibiotics as the patient's symptoms are worsening as well as leukocytosis is worsening.  Type 2 diabetes mellitus uncontrolled with hypoglycemia with vascular and renal complication Glimepiride, Metformin and degludec have been held.   NovoLog as needed for hyperglycemia.   Hemoglobin A1c 6.5 Gentle hydration with D5 for now given patient's persistent hypoglycemia. Continue to monitor CBG AC at bedtime. Continue diet.  Metabolic encephalopathy: Resolved  CKD stage III b: Creatinine is worse today but it's still around his baseline.  Patient received IV fluids because he underwent left heart catheterization today.  Monitor BMP.  Chronic atrial fibrillation: Resume Eliquis.  Of note, patient said that he was taking Eliquis at home up until this  admission.  COPD: Stable  We will provide incentive spirometry to treat patient's reported shortness of breath.  Examination does not reveal any acute abnormality nor the chest x-ray  findings suggest any acute abnormality.   Family Communication/Anticipated D/C date and plan/Code Status   DVT prophylaxis: Resume Eliquis Code Status: Full code Family Communication: Plan discussed with the patient Disposition Plan: Home in 2 to 3 days depending on clinical improvement.      Subjective:   Continues to report shortness of breath no nausea no vomiting no chest pain abdominal pain.  Passing gas but no bowel movement.  Minimal oral intake leading to hypoglycemia.   Objective:    Vitals:   11/01/19 1300 11/01/19 1400 11/01/19 1647 11/01/19 1700  BP: (!) 161/50 (!) 143/52 (!) 141/71 (!) 128/59  Pulse: 89 87 70 82  Resp: 17 18    Temp: 98.4 F (36.9 C) 98.9 F (37.2 C) 98.8 F (37.1 C)   TempSrc: Oral Oral Oral   SpO2: 99% 100%  98%  Weight:      Height:        Intake/Output Summary (Last 24 hours) at 11/01/2019 1832 Last data filed at 11/01/2019 1800 Gross per 24 hour  Intake 19.66 ml  Output 1175 ml  Net -1155.34 ml   Filed Weights   10/30/19 1556 10/31/19 0409 11/01/19 0417  Weight: 96.5 kg 96.2 kg 95.1 kg    Exam: General: alert and oriented to time, place, and person. Appear in moderate distress, affect appropriate Eyes: PERRL, Conjunctiva normal ENT: Oral Mucosa Clear, moist  Neck: no JVD, no Abnormal Mass Or lumps Cardiovascular: S1 and S2 Present, no Murmur, peripheral pulses symmetrical Respiratory: increased respiratory effort, Bilateral Air entry equal and Decreased, no signs of accessory muscle use, faint crackles, no wheezes Abdomen: Bowel Sound present, Soft and distended, mild tenderness, no hernia Skin: no rashes  Extremities: no Pedal edema, no calf tenderness Neurologic: without any new focal findings Gait not checked due to patient safety concerns     Data Reviewed:   I have personally reviewed following labs and imaging studies:  Labs: Labs show the following:   Basic Metabolic Panel: Recent Labs  Lab 10/29/19 0016  10/29/19 0301 10/30/19 0319 10/31/19 0500 11/01/19 0504  NA 138 137 138 139  138 139  K 3.6 4.7 4.1 4.3  4.1 4.5  CL 102 102 105 105  105 102  CO2 21* 21* 22 25  26 27   GLUCOSE 147* 215* 188* 112*  111* 99  BUN 27* 30* 38* 35*  36* 33*  CREATININE 1.50* 1.38* 1.81* 1.69*  1.68*  1.75* 1.60*  CALCIUM 8.8* 8.7* 8.4* 8.4*  8.4* 8.9  MG  --   --   --   --  1.9   GFR Estimated Creatinine Clearance: 46.2 mL/min (A) (by C-G formula based on SCr of 1.6 mg/dL (H)). Liver Function Tests: Recent Labs  Lab 10/29/19 0016 10/29/19 0301  AST 38 37  ALT 18 19  ALKPHOS 80 75  BILITOT 1.5* 1.8*  PROT 7.5 7.2  ALBUMIN 3.6 3.5   No results for input(s): LIPASE, AMYLASE in the last 168 hours. No results for input(s): AMMONIA in the last 168 hours. Coagulation profile Recent Labs  Lab 10/29/19 0301  INR 1.3*    CBC: Recent Labs  Lab 10/29/19 0016 10/29/19 0301 10/30/19 0319 10/31/19 0500 11/01/19 0504  WBC 17.6* 14.4* 10.5 10.9* 12.4*  NEUTROABS  --  12.6*  --  8.4*  --  HGB 14.4 13.4 12.8* 13.2 14.3  HCT 43.3 40.1 39.7 41.0 44.2  MCV 93.1 92.2 95.9 97.6 94.8  PLT 243 226 195 210 217   Cardiac Enzymes: Recent Labs  Lab 10/29/19 0016  CKTOTAL 465*   BNP (last 3 results) No results for input(s): PROBNP in the last 8760 hours. CBG: Recent Labs  Lab 10/31/19 2133 11/01/19 0830 11/01/19 0857 11/01/19 1203 11/01/19 1650  GLUCAP 123* 63* 112* 180* 99   D-Dimer: No results for input(s): DDIMER in the last 72 hours. Hgb A1c: No results for input(s): HGBA1C in the last 72 hours. Lipid Profile: No results for input(s): CHOL, HDL, LDLCALC, TRIG, CHOLHDL, LDLDIRECT in the last 72 hours. Thyroid function studies: No results for input(s): TSH, T4TOTAL, T3FREE, THYROIDAB in the last 72 hours.  Invalid input(s): FREET3 Anemia work up: No results for input(s): VITAMINB12, FOLATE, FERRITIN, TIBC, IRON, RETICCTPCT in the last 72 hours. Sepsis Labs: Recent Labs   Lab 10/29/19 0106 10/29/19 0301 10/30/19 0319 10/31/19 0500 11/01/19 0504 11/01/19 1043 11/01/19 1200  PROCALCITON  --   --   --   --   --  0.19  --   WBC  --  14.4* 10.5 10.9* 12.4*  --   --   LATICACIDVEN 6.6* 3.9*  --   --   --  1.1 1.4    Microbiology Recent Results (from the past 240 hour(s))  Blood Culture (routine x 2)     Status: None (Preliminary result)   Collection Time: 10/29/19  1:06 AM   Specimen: BLOOD  Result Value Ref Range Status   Specimen Description BLOOD RIGHT ARM  Final   Special Requests   Final    BOTTLES DRAWN AEROBIC AND ANAEROBIC Blood Culture adequate volume   Culture   Final    NO GROWTH 3 DAYS Performed at [facility], [address] [place], [place] [address]    Report Status PENDING  Incomplete  Blood Culture (routine x 2)     Status: None (Preliminary result)   Collection Time: 10/29/19  1:06 AM   Specimen: BLOOD  Result Value Ref Range Status   Specimen Description BLOOD LEFT ARM  Final   Special Requests   Final    BOTTLES DRAWN AEROBIC AND ANAEROBIC Blood Culture adequate volume   Culture   Final    NO GROWTH 3 DAYS Performed at [facility], [address] [place], [place] [address]    Report Status PENDING  Incomplete  SARS CORONAVIRUS 2 (TAT 6-24 HRS) Nasopharyngeal Nasopharyngeal Swab     Status: None   Collection Time: 10/29/19  1:36 AM   Specimen: Nasopharyngeal Swab  Result Value Ref Range Status   SARS Coronavirus 2 NEGATIVE NEGATIVE Final    Comment: (NOTE) SARS-CoV-2 target nucleic acids are NOT DETECTED. The SARS-CoV-2 RNA is generally detectable in upper and lower respiratory specimens during the acute phase of infection. Negative results do not preclude SARS-CoV-2 infection, do not rule out co-infections with other pathogens, and should not be used as the sole basis for treatment or other patient management decisions. Negative results must be combined with clinical observations, patient  history, and epidemiological information. The expected result is Negative. Fact Sheet for Patients: HairSlick.no Fact Sheet for Healthcare Providers: quierodirigir.com This test is not yet approved or cleared by the [place] FDA and  has been authorized for detection and/or diagnosis of SARS-CoV-2 by FDA under an Emergency Use Authorization (EUA). This EUA will remain  in effect (  meaning this test can be used) for the duration of the COVID-19 declaration under Section 56 4(b)(1) of the Act, 21 U.S.C. section 360bbb-3(b)(1), unless the authorization is terminated or revoked sooner. Performed at [facility], [place] [address], [place], [place] [address]   Urine culture     Status: None   Collection Time: 10/29/19  7:04 AM   Specimen: In/Out Cath Urine  Result Value Ref Range Status   Specimen Description   Final    IN/OUT CATH URINE Performed at [facility], [address] [place], [place] [address]    Special Requests   Final    NONE Performed at [facility], [address] [place], [place] [address]    Culture   Final    NO GROWTH Performed at [facility], [place] [address], [place], [place] [address]    Report Status 10/30/2019 FINAL  Final    Procedures and diagnostic studies:  Dg Chest 1 View  Result Date: 10/31/2019 CLINICAL DATA:  Shortness of breath EXAM: CHEST  1 VIEW COMPARISON:  October 30, 2019 FINDINGS: There is persistent consolidation in the lateral right base with small right pleural effusion. The lungs elsewhere are clear. There is cardiomegaly with pulmonary vascularity normal. No adenopathy. Patient is status post median sternotomy. The two superior most sternal wires are fractured, unchanged. No adenopathy. No bone lesions. IMPRESSION: Lateral right base airspace consolidation with small right pleural effusion. Lungs elsewhere clear. Stable cardiomegaly. Stable  postoperative changes. Electronically Signed   By: Lowella Grip III M.D.   On: 10/31/2019 07:26   [place] Chest (pleural Effusion)  Result Date: 11/01/2019 CLINICAL DATA:  Pleural effusion. EXAM: CHEST ULTRASOUND COMPARISON:  Chest x-ray 10/31/2019. FINDINGS: Very small right pleural effusion noted. No left pleural effusion identified. No large enough effusion noted for safe thoracentesis. IMPRESSION: No large enough pleural effusion noted for safe thoracentesis. Electronically Signed   By: Marcello Moores  Register   On: 11/01/2019 12:37   Dg Abd Portable 1v  Result Date: 11/01/2019 CLINICAL DATA:  Constipation. EXAM: PORTABLE ABDOMEN - 1 VIEW COMPARISON:  CT 10/29/2019. FINDINGS: Penile prosthesis noted soft tissue structures are unremarkable. Aortoiliac and visceral atherosclerotic vascular calcification no bowel distention. Moderate stool volume. No free air. Right base atelectasis and pleural effusion again noted. Degenerative change lumbar spine and both hips. IMPRESSION: 1.  Moderate stool volume.  No bowel distention. 2. Right base atelectasis and right-sided pleural effusion again noted. Electronically Signed   By: [place]   On: 11/01/2019 11:12    Medications:   . apixaban  5 mg Oral BID  . aspirin EC  81 mg Oral Daily  . atorvastatin  80 mg Oral Daily  . azithromycin  500 mg Oral Daily  . furosemide  20 mg Oral BID  . gabapentin  300 mg Oral QHS  . polyethylene glycol  17 g Oral Daily  . senna-docusate  2 tablet Oral BID  . sodium chloride flush  3 mL Intravenous Q12H   Continuous Infusions: . sodium chloride 250 mL (10/31/19 0132)  . [START ON 11/02/2019] cefTRIAXone (ROCEPHIN)  IV    . dextrose 50 mL/hr at 11/01/19 1735     LOS: 3 days   Berle Mull  [facility]   *Please refer to [email or web address], password [email or web address] to get updated schedule on who will round on this patient, as hospitalists switch teams weekly. If 7PM-7AM, please contact night-coverage at [email or web address],  password [email or web address] for any overnight needs.  11/01/2019, 6:32 PM

## 2019-11-01 NOTE — Progress Notes (Signed)
Inpatient Diabetes Program Recommendations  AACE/ADA: New Consensus Statement on Inpatient Glycemic Control (2015)  Target Ranges:  Prepandial:   less than 140 mg/dL      Peak postprandial:   less than 180 mg/dL (1-2 hours)      Critically ill patients:  140 - 180 mg/dL   Lab Results  Component Value Date   GLUCAP 180 (H) 11/01/2019   HGBA1C 6.5 (H) 10/29/2019    Review of Glycemic Control  Results for GALVIN, AVERSA (MRN [id number]) as of 11/01/2019 14:07  Ref. Range 10/31/2019 16:14 10/31/2019 21:33 11/01/2019 08:30 11/01/2019 08:57 11/01/2019 12:03  Glucose-Capillary Latest Ref Range: 70 - 99 mg/dL 130 (H) 123 (H) 63 (L) 112 (H) 180 (H)    Diabetes history: DM2 Outpatient Diabetes medications: Tresiba 130 units QD + Metformin 1000 mg BID + Amaryl 4 mg QD Current orders for Inpatient glycemic control: Novolog 0-5 units QHS  Note:  Spoke with patient at bedside to confirm he is taking above home medications which he states is takes everyday.  No difficulties obtaining medications.  He checks his blood sugar in the am and afternoon.  He is current with Dr. Honor Junes; last saw is October.  States he has occasional lows and treats with juice or sugar.  Will continue to follow during hospitalization.    Thank you, Geoffry Paradise, RN, BSN Diabetes Coordinator Inpatient Diabetes Program [phone] (team pager from 8a-5p)

## 2019-11-02 LAB — BASIC METABOLIC PANEL
Anion gap: 8 (ref 5–15)
BUN: 33 mg/dL — ABNORMAL HIGH (ref 8–23)
CO2: 27 mmol/L (ref 22–32)
Calcium: 8.3 mg/dL — ABNORMAL LOW (ref 8.9–10.3)
Chloride: 98 mmol/L (ref 98–111)
Creatinine, Ser: 1.59 mg/dL — ABNORMAL HIGH (ref 0.61–1.24)
GFR calc Af Amer: 48 mL/min — ABNORMAL LOW (ref >60)
GFR calc non Af Amer: 42 mL/min — ABNORMAL LOW (ref >60)
Glucose, Bld: 112 mg/dL — ABNORMAL HIGH (ref 70–99)
Potassium: 4.1 mmol/L (ref 3.5–5.1)
Sodium: 133 mmol/L — ABNORMAL LOW (ref 135–145)

## 2019-11-02 LAB — GLUCOSE, CAPILLARY
Glucose-Capillary: 109 mg/dL — ABNORMAL HIGH (ref 70–99)
Glucose-Capillary: 178 mg/dL — ABNORMAL HIGH (ref 70–99)
Glucose-Capillary: 194 mg/dL — ABNORMAL HIGH (ref 70–99)
Glucose-Capillary: 149 mg/dL — ABNORMAL HIGH (ref 70–99)

## 2019-11-02 LAB — CBC
HCT: 39.9 % (ref 39.0–52.0)
Hemoglobin: 13.1 g/dL (ref 13.0–17.0)
MCH: 30.8 pg (ref 26.0–34.0)
MCHC: 32.8 g/dL (ref 30.0–36.0)
MCV: 93.9 fL (ref 80.0–100.0)
Platelets: 221 10*3/uL (ref 150–400)
RBC: 4.25 MIL/uL (ref 4.22–5.81)
RDW: 13.7 % (ref 11.5–15.5)
WBC: 14.7 10*3/uL — ABNORMAL HIGH (ref 4.0–10.5)
nRBC: 0 % (ref 0.0–0.2)

## 2019-11-02 LAB — EXPECTORATED SPUTUM ASSESSMENT W GRAM STAIN, RFLX TO RESP C

## 2019-11-02 MED ORDER — LACTULOSE 10 GM/15ML PO SOLN
20.0000 g | Freq: Two times a day (BID) | ORAL | Status: AC
  Administered 2019-11-02: 20 g via ORAL
  Filled 2019-11-02 (×2): qty 30

## 2019-11-02 NOTE — Progress Notes (Signed)
Cardiovascular and Pulmonary Nurse Navigator Note:    73 year old male with past medical history of atrial fibrillation, coronary artery disease, congestive heart failure, diabetes mellitus, gout, hypercholesterolemia, hypertension who was admitted to [facility] with syncopal episode with acute NSTEMI and hypoglycemia.  Patient underwent cardiac catheterization on 10/30/2019.    Procedures 10/30/2019 LEFT HEART CATH AND CORS/GRAFTS ANGIOGRAPHY  Conclusion    Ost LAD to Prox LAD lesion is 100% stenosed.  Dist LAD lesion is 50% stenosed.  Prox Cx to Mid Cx lesion is 30% stenosed.  Previously placed Mid Cx drug eluting stent is widely patent.  Balloon angioplasty was performed.  Ost 1st Mrg to 1st Mrg lesion is 90% stenosed.  Ost 2nd Mrg to 2nd Mrg lesion is 100% stenosed.  Prox RCA to Mid RCA lesion is 50% stenosed.  SVG and is moderate in size.  LIMA and is moderate in size.  3rd Mrg-1 lesion is 90% stenosed.  3rd Mrg-2 lesion is 80% stenosed.   73 year old male with none permanent atrial fibrillation hypertension hyperlipidemia diabetes having a syncopal episode in a non-ST elevation myocardial infarction  LV showing moderate LV systolic dysfunction with akinesis of the posterior lateral wall with ejection fraction of 35%  Patent graft to ramus (obtuse marginal) Patent LIMA graft to LAD Moderate atherosclerosis of right coronary artery Patent stent of proximal left circumflex artery Patent native left sided PDA Severe stenosis of obtuse marginal 2 with complicated takeoff and stenoses with collaterals from grafted obtuse marginal and/or ramus which appears to be the culprit of non-ST elevation myocardial infarction  Plan No further cardiac intervention at this time due to collateral flow to obtuse marginal with infarct High intensity cholesterol therapy Avoid beta-blocker due to bradycardia and possible syncope Avoid ACE inhibitor due to severe chronic kidney disease  stage iii Begin ambulation and cardiac rehabilitation and further evaluation of other possible syncopal episode issues including carotid atherosclerosis   Patient Info Patient name: Jeremiah Chapman  MRN: [id number]  Age: 73 y.o.  Sex: male  Vitals  BP Height Weight BSA (Calculated - sq m)  137/71 5\' 10"  (1.778 m) 97.8 kg 2.18 sq meters  Study Result  Result status: Final result    ECHOCARDIOGRAM REPORT       Patient Name:   Jeremiah Chapman Date of Exam: 10/29/2019 Medical Rec #:  [id number]        Height:       70.0 in Accession #:    [id number]       Weight:       212.0 lb Date of Birth:  Jan 06, 1946        BSA:          2.14 m Patient Age:    73 years         BP:           123/58 mmHg Patient Gender: M                HR:           61 bpm. Exam Location:  [facility]  Procedure: 2D Echo, Color Doppler and Cardiac Doppler  Indications:     R06.00 Dyspnea   History:         Patient has prior history of Echocardiogram examinations, most                  recent 05/27/2019. CHF, CAD, Arrythmias:Atrial Fibrillation;  Risk Factors:Hypertension and Diabetes.   Sonographer:     Humphrey Rolls RDCS (AE) Referring Phys:  [id number] Lurene Shadow Diagnosing Phys: Arnoldo Hooker MD    Sonographer Comments: TDS, pt supine S/P cath. IMPRESSIONS    1. Left ventricular ejection fraction, by visual estimation, is 30 to 35%. The left ventricle has moderately decreased function. There is moderately increased left ventricular hypertrophy.  2. Moderate lateral and inferior myocardial infarction.  3. The left ventricle demonstrates regional wall motion abnormalities.  4. Global right ventricle has normal systolic function.The right ventricular size is normal. No increase in right ventricular wall thickness.  5. Left atrial size was mildly dilated.  6. Right atrial size was mildly dilated.  7. The mitral valve is normal in structure. Mild to moderate mitral valve regurgitation.  8.  The tricuspid valve is normal in structure. Tricuspid valve regurgitation is mild.  9. The aortic valve is normal in structure. Aortic valve regurgitation is trivial. 10. The pulmonic valve was grossly normal. Pulmonic valve regurgitation is trivial.    EDUCATION:   Rounded on patient.  Patient napping and awakened when this nurse entered the room.  Explained the purpose of my visit and that cardiac rehab would be discussed.  As noted above, CAD is not a new diagnosis for this patient.  Explained to patient his cardiologist has referred him to outpatient Cardiac Rehab at [facility].  At the time of this note, OT evaluation completed and recommending HH OT.  PT evaluation still pending.  Explained to patient if he needed in-home physical therapy or occupational therapy this would be completed first prior to starting outpatient Cardiac Rehab.  Also, explained to patient that our department would be checking with him by phone to learn of his progress.   Patient verbalized understanding.    Army Melia, RN, BSN, [facility]  [place]  [facility] Cardiac & Pulmonary Rehab  Cardiovascular & Pulmonary Nurse Navigator  Direct Line: [phone]  Department Phone #: [phone] Fax: [phone]  Email Address: [email or web address]

## 2019-11-02 NOTE — Progress Notes (Signed)
Progress Note    Jeremiah Chapman  ZOX:[id number] DOB: 1946/09/29  DOA: 10/29/2019 PCP: Dorothey Baseman, MD      Brief Narrative:    Medical records reviewed and are as summarized below:  Jeremiah Chapman is an 73 y.o. male with medical history significant for COPD, atrial fibrillation, chronic systolic CHF with EF of 40%, type 2 diabetes mellitus, obesity (BMI 30.4), CKD stage III.  He was brought to the hospital because of altered mental status/lethargy.  Apparently, he was found on his kitchen floor by a neighbor.  Reportedly, his blood glucose level was 50 when EMS arrived at the scene.  In the emergency room, his blood glucose was 13 and he was hypothermic and bradycardic.  In the ED, he also had complaints of increasing shortness of breath.      Assessment/Plan:   Principal Problem:   Hypoglycemia Active Problems:   Sepsis (HCC)   Acute on chronic systolic CHF (congestive heart failure) (HCC)   NSTEMI (non-ST elevated myocardial infarction) (HCC)   Body mass index is 30.95 kg/m.    Acute exacerbation of chronic systolic CHF:  BNP was 1,511.   Patient was treated with IV Lasix. back to home Lasix 20 mg twice daily.  Acute non-ST elevation MI:  Continue aspirin and Lipitor.   Metoprolol on hold because of bradycardia.   Resume home dose of Eliquis.   S/p left heart cath with report as listed below "Patent graft to ramus (obtuse marginal) Patent LIMA graft to LAD Moderate atherosclerosis of right coronary artery Patent stent of proximal left circumflex artery Patent native left sided PDA Severe stenosis of obtuse marginal 2 with complicated takeoff and stenoses with collaterals from grafted obtuse marginal and/or ramus which appears to be the culprit of non-ST elevation myocardial infarction  Plan No further cardiac intervention at this time due to collateral flow to obtuse marginal with infarct High intensity cholesterol therapy Avoid beta-blocker due  to bradycardia and possible syncope Avoid ACE inhibitor due to severe chronic kidney disease stage iii Begin ambulation and cardiac rehabilitation and further evaluation of other possible syncopal episode issues including carotid atherosclerosis"   Right pleural effusion. Ultrasound paracentesis ordered on 11/01/2019 was canceled as the patient does not have significant amount of pleural fluid. Patient continues to remain short of breath although chest x-ray negative and normal pleural effusion that can be drained on ultrasound. Possibly abdominal etiology with constipation.  Contributory constipation.  Constipation. Bowel regimen initiated. Continue to treat.  Probable sepsis with probable pneumonia as the likely etiology Suspected his presenting clinical features were most likely from acute MI complicated by acute heart failure exacerbation.   Repeat chest x-ray shows no significant pneumonia and no significant CHF as well. Follow-up on blood cultures.  So far no growth. Back on IV antibiotics as the patient's symptoms are worsening as well as leukocytosis is worsening.  Type 2 diabetes mellitus uncontrolled with hypoglycemia with vascular and renal complication Glimepiride, Metformin and degludec have been held.   NovoLog as needed for hyperglycemia.   Hemoglobin A1c 6.5 Gentle hydration with D5 for now given patient's persistent hypoglycemia. Continue to monitor CBG AC at bedtime. Continue diet.  Metabolic encephalopathy: Resolved  CKD stage III b: Creatinine is worse today but it's still around his baseline.  Patient received IV fluids because he underwent left heart catheterization today.  Monitor BMP.  Chronic atrial fibrillation: Resume Eliquis.  Of note, patient said that he was taking Eliquis at home  up until this admission.  COPD: Stable  We will provide incentive spirometry to treat patient's reported shortness of breath.  Examination does not reveal any acute abnormality  nor the chest x-ray findings suggest any acute abnormality.   Family Communication/Anticipated D/C date and plan/Code Status   DVT prophylaxis: Resume Eliquis Code Status: Full code Family Communication: Plan discussed with the patient Disposition Plan: Home in 2 to 3 days depending on clinical improvement.      Subjective:   Continues to have shortness of breath but feels better.  No nausea no vomiting continues to have abdominal distention.  Continues to report constipation.  Passing gas.   Objective:    Vitals:   11/02/19 0607 11/02/19 0827 11/02/19 1056 11/02/19 1459  BP:  137/71  129/60  Pulse:  66 77 80  Resp:      Temp: 99.8 F (37.7 C) 97.8 F (36.6 C)  97.7 F (36.5 C)  TempSrc: Oral Oral  Oral  SpO2:  100% 98% 99%  Weight:      Height:        Intake/Output Summary (Last 24 hours) at 11/02/2019 1943 Last data filed at 11/02/2019 1658 Gross per 24 hour  Intake 859.97 ml  Output 950 ml  Net -90.03 ml   Filed Weights   10/31/19 0409 11/01/19 0417 11/02/19 0450  Weight: 96.2 kg 95.1 kg 97.8 kg    Exam: General: alert and oriented to time, place, and person. Appear in moderate distress, affect appropriate Eyes: PERRL, Conjunctiva normal ENT: Oral Mucosa Clear, moist  Neck: no JVD, no Abnormal Mass Or lumps Cardiovascular: S1 and S2 Present, no Murmur, peripheral pulses symmetrical Respiratory: increased respiratory effort, Bilateral Air entry equal and Decreased, no signs of accessory muscle use, faint crackles, no wheezes Abdomen: Bowel Sound present, Soft and distended, mild tenderness, no hernia Skin: no rashes  Extremities: no Pedal edema, no calf tenderness Neurologic: without any new focal findings Gait not checked due to patient safety concerns     Data Reviewed:   I have personally reviewed following labs and imaging studies:  Labs: Labs show the following:   Basic Metabolic Panel: Recent Labs  Lab 10/29/19 0301 10/30/19 0319  10/31/19 0500 11/01/19 0504 11/02/19 0431  NA 137 138 139  138 139 133*  K 4.7 4.1 4.3  4.1 4.5 4.1  CL 102 105 105  105 102 98  CO2 21* 22 25  26 27 27   GLUCOSE 215* 188* 112*  111* 99 112*  BUN 30* 38* 35*  36* 33* 33*  CREATININE 1.38* 1.81* 1.69*  1.68*  1.75* 1.60* 1.59*  CALCIUM 8.7* 8.4* 8.4*  8.4* 8.9 8.3*  MG  --   --   --  1.9  --    GFR Estimated Creatinine Clearance: 47.1 mL/min (A) (by C-G formula based on SCr of 1.59 mg/dL (H)). Liver Function Tests: Recent Labs  Lab 10/29/19 0016 10/29/19 0301  AST 38 37  ALT 18 19  ALKPHOS 80 75  BILITOT 1.5* 1.8*  PROT 7.5 7.2  ALBUMIN 3.6 3.5   No results for input(s): LIPASE, AMYLASE in the last 168 hours. No results for input(s): AMMONIA in the last 168 hours. Coagulation profile Recent Labs  Lab 10/29/19 0301  INR 1.3*    CBC: Recent Labs  Lab 10/29/19 0301 10/30/19 0319 10/31/19 0500 11/01/19 0504 11/02/19 0431  WBC 14.4* 10.5 10.9* 12.4* 14.7*  NEUTROABS 12.6*  --  8.4*  --   --  HGB 13.4 12.8* 13.2 14.3 13.1  HCT 40.1 39.7 41.0 44.2 39.9  MCV 92.2 95.9 97.6 94.8 93.9  PLT 226 195 210 217 221   Cardiac Enzymes: Recent Labs  Lab 10/29/19 0016  CKTOTAL 465*   BNP (last 3 results) No results for input(s): PROBNP in the last 8760 hours. CBG: Recent Labs  Lab 11/01/19 1650 11/01/19 2048 11/02/19 0828 11/02/19 1151 11/02/19 1710  GLUCAP 99 112* 109* 178* 194*   D-Dimer: No results for input(s): DDIMER in the last 72 hours. Hgb A1c: No results for input(s): HGBA1C in the last 72 hours. Lipid Profile: No results for input(s): CHOL, HDL, LDLCALC, TRIG, CHOLHDL, LDLDIRECT in the last 72 hours. Thyroid function studies: No results for input(s): TSH, T4TOTAL, T3FREE, THYROIDAB in the last 72 hours.  Invalid input(s): FREET3 Anemia work up: No results for input(s): VITAMINB12, FOLATE, FERRITIN, TIBC, IRON, RETICCTPCT in the last 72 hours. Sepsis Labs: Recent Labs  Lab 10/29/19  0106 10/29/19 0301 10/30/19 0319 10/31/19 0500 11/01/19 0504 11/01/19 1043 11/01/19 1200 11/02/19 0431  PROCALCITON  --   --   --   --   --  0.19  --   --   WBC  --  14.4* 10.5 10.9* 12.4*  --   --  14.7*  LATICACIDVEN 6.6* 3.9*  --   --   --  1.1 1.4  --     Microbiology Recent Results (from the past 240 hour(s))  Blood Culture (routine x 2)     Status: None (Preliminary result)   Collection Time: 10/29/19  1:06 AM   Specimen: BLOOD  Result Value Ref Range Status   Specimen Description BLOOD RIGHT ARM  Final   Special Requests   Final    BOTTLES DRAWN AEROBIC AND ANAEROBIC Blood Culture adequate volume   Culture   Final    NO GROWTH 4 DAYS Performed at [facility], [address] [place], [place] [address]    Report Status PENDING  Incomplete  Blood Culture (routine x 2)     Status: None (Preliminary result)   Collection Time: 10/29/19  1:06 AM   Specimen: BLOOD  Result Value Ref Range Status   Specimen Description BLOOD LEFT ARM  Final   Special Requests   Final    BOTTLES DRAWN AEROBIC AND ANAEROBIC Blood Culture adequate volume   Culture   Final    NO GROWTH 4 DAYS Performed at [facility], [address] [place], [place] [address]    Report Status PENDING  Incomplete  SARS CORONAVIRUS 2 (TAT 6-24 HRS) Nasopharyngeal Nasopharyngeal Swab     Status: None   Collection Time: 10/29/19  1:36 AM   Specimen: Nasopharyngeal Swab  Result Value Ref Range Status   SARS Coronavirus 2 NEGATIVE NEGATIVE Final    Comment: (NOTE) SARS-CoV-2 target nucleic acids are NOT DETECTED. The SARS-CoV-2 RNA is generally detectable in upper and lower respiratory specimens during the acute phase of infection. Negative results do not preclude SARS-CoV-2 infection, do not rule out co-infections with other pathogens, and should not be used as the sole basis for treatment or other patient management decisions. Negative results must be combined with clinical  observations, patient history, and epidemiological information. The expected result is Negative. Fact Sheet for Patients: HairSlick.nohttps://www.fda.gov/media/138098/download Fact Sheet for Healthcare Providers: quierodirigir.comhttps://www.fda.gov/media/138095/download This test is not yet approved or cleared by the [place] FDA and  has been authorized for detection and/or diagnosis of SARS-CoV-2 by FDA under an Emergency Use  Authorization (EUA). This EUA will remain  in effect (meaning this test can be used) for the duration of the COVID-19 declaration under Section 56 4(b)(1) of the Act, 21 U.S.C. section 360bbb-3(b)(1), unless the authorization is terminated or revoked sooner. Performed at [facility], [address] [place], [place] [address]   Urine culture     Status: None   Collection Time: 10/29/19  7:04 AM   Specimen: In/Out Cath Urine  Result Value Ref Range Status   Specimen Description   Final    IN/OUT CATH URINE Performed at [facility], [address], [place], [place] [address]    Special Requests   Final    NONE Performed at [facility], [address] [place], [place] [address]    Culture   Final    NO GROWTH Performed at [facility], [address] [place], [place] [address]    Report Status 10/30/2019 FINAL  Final  Culture, blood (x 2)     Status: None (Preliminary result)   Collection Time: 11/01/19 10:43 AM   Specimen: BLOOD  Result Value Ref Range Status   Specimen Description BLOOD RIGHT AC  Final   Special Requests   Final    BOTTLES DRAWN AEROBIC AND ANAEROBIC Blood Culture adequate volume   Culture   Final    NO GROWTH < 24 HOURS Performed at [facility], [address] [place], [place] [address]    Report Status PENDING  Incomplete  Culture, blood (x 2)     Status: None (Preliminary result)   Collection Time: 11/01/19 12:00 PM   Specimen: BLOOD  Result Value Ref Range Status   Specimen Description BLOOD RIGHT  ANTECUBITAL  Final   Special Requests   Final    BOTTLES DRAWN AEROBIC AND ANAEROBIC Blood Culture adequate volume   Culture   Final    NO GROWTH < 24 HOURS Performed at [facility], [address], [place], [place] [address]    Report Status PENDING  Incomplete  Culture, sputum-assessment     Status: None   Collection Time: 11/01/19  5:38 PM   Specimen: Expectorated Sputum  Result Value Ref Range Status   Specimen Description EXPECTORATED SPUTUM  Final   Special Requests NONE  Final   Sputum evaluation   Final    THIS SPECIMEN IS ACCEPTABLE FOR SPUTUM CULTURE Performed at [facility], [address], [place], [place] [address]    Report Status 11/02/2019 FINAL  Final  Culture, respiratory     Status: None (Preliminary result)   Collection Time: 11/01/19  5:38 PM  Result Value Ref Range Status   Specimen Description   Final    EXPECTORATED SPUTUM Performed at [facility], [address], [place], [place] [address]    Special Requests   Final    NONE Reflexed from [id number] Performed at [facility], [address] [place], [place] [address]    Gram Stain   Final    ABUNDANT WBC PRESENT,BOTH PMN AND MONONUCLEAR FEW GRAM POSITIVE COCCI Performed at [facility], [address], [place], [place] [address]    Culture PENDING  Incomplete   Report Status PENDING  Incomplete    Procedures and diagnostic studies:  [place] Chest (pleural Effusion)  Result Date: 11/01/2019 CLINICAL DATA:  Pleural effusion. EXAM: CHEST ULTRASOUND COMPARISON:  Chest x-ray 10/31/2019. FINDINGS: Very small right pleural effusion noted. No left pleural effusion identified. No large  enough effusion noted for safe thoracentesis. IMPRESSION: No large enough pleural effusion noted for safe thoracentesis. Electronically Signed   By: Maisie Fus  Register   On: 11/01/2019 12:37   Dg Abd Portable 1v  Result Date: 11/01/2019 CLINICAL DATA:  Constipation. EXAM: PORTABLE  ABDOMEN - 1 VIEW COMPARISON:  CT 10/29/2019. FINDINGS: Penile prosthesis noted soft tissue structures are unremarkable. Aortoiliac and visceral atherosclerotic vascular calcification no bowel distention. Moderate stool volume. No free air. Right base atelectasis and pleural effusion again noted. Degenerative change lumbar spine and both hips. IMPRESSION: 1.  Moderate stool volume.  No bowel distention. 2. Right base atelectasis and right-sided pleural effusion again noted. Electronically Signed   By: Maisie Fus  Register   On: 11/01/2019 11:12    Medications:   . apixaban  5 mg Oral BID  . aspirin EC  81 mg Oral Daily  . atorvastatin  80 mg Oral Daily  . furosemide  20 mg Oral BID  . gabapentin  300 mg Oral QHS  . lactulose  20 g Oral BID  . polyethylene glycol  17 g Oral Daily  . senna-docusate  2 tablet Oral BID  . sodium chloride flush  3 mL Intravenous Q12H   Continuous Infusions: . sodium chloride 250 mL (10/31/19 0132)  . cefTRIAXone (ROCEPHIN)  IV 2 g (11/02/19 1000)     LOS: 4 days   Lynden Oxford  [facility]   *Please refer to [email or web address], password [email or web address] to get updated schedule on who will round on this patient, as hospitalists switch teams weekly. If 7PM-7AM, please contact night-coverage at [email or web address], password [email or web address] for any overnight needs.  11/02/2019, 7:43 PM

## 2019-11-02 NOTE — Progress Notes (Signed)
Occupational Therapy Treatment Patient Details Name: Jeremiah Chapman MRN: [id number] DOB: 1946-04-14 Today's Date: 11/02/2019    History of present illness Pt. is a 73 y.o. male who was admitted to [facility] with Syncopal Episode with Acute Non ST Elevation MI, Hypogycemia. Pt. had a Cardiac Catheterization with Stent placement on 12/01. PMHx includes: AFib, CAD, CHF, DM, Gout, Hypercholesterolemia, HTN.   OT comments  Pt seen for OT tx this date. Pt reporting 10/10 arthritis pain (RN notified while in room and provided pain medicine). Pt declines any mobility 2/2 pain and despite encouragement and education in benefits of moving to prevent stiffness. Pt reports not getting up since he's been in the hospital. But continues to decline functional ADL and mobility. Encouraged to attempt with PT later today. Pt instructed in ECS with handout provided in addition to instruction in importance of weight checks at home (same time of day, checking for BLE edema, recording) as part of chronic disease self mgt. Pt instructed in BLE heel slides in preparation for bed mobility efforts. Pt will benefit from additional instruction and encouragement to support buy in and carryover. Continue to recommend HHOT at this time. However, will follow acutely for progress. May need to update to SNF pending progress.   Follow Up Recommendations  Home health OT    Equipment Recommendations       Recommendations for Other Services      Precautions / Restrictions Precautions Precautions: Fall Restrictions Weight Bearing Restrictions: No       Mobility Bed Mobility               General bed mobility comments: pt declines 2/2 pain  Transfers                 General transfer comment: pt declines 2/2 pain    Balance                                           ADL either performed or assessed with clinical judgement   ADL Overall ADL's : Needs assistance/impaired                                        General ADL Comments: Continues to require min-mod assist for LB ADL, limited by arthritis pain in B knees     Vision Patient Visual Report: No change from baseline     Perception     Praxis      Cognition Arousal/Alertness: Awake/alert Behavior During Therapy: WFL for tasks assessed/performed;Flat affect Overall Cognitive Status: Within Functional Limits for tasks assessed                                 General Comments: pt appears mildly annoyed and requires encouragement to participate        Exercises Other Exercises Other Exercises: Pt instructed in ECS with handout provided Other Exercises: Pt instructed briefly in BLE heel slides in preparation for bed mobility, with pt able to perform x3 each leg with some difficulty but no physical assist required Other Exercises: Min A for BLE mgt while placing pillows to float heels   Shoulder Instructions       General Comments      Pertinent Vitals/  Pain       Pain Assessment: 0-10 Pain Score: 10-Worst pain ever Pain Location: B knees 2/2 arthritis per pt Pain Descriptors / Indicators: Aching Pain Intervention(s): Limited activity within patient's tolerance;Monitored during session;Patient requesting pain meds-RN notified;RN gave pain meds during session  Home Living                                          Prior Functioning/Environment              Frequency  Min 2X/week        Progress Toward Goals  OT Goals(current goals can now be found in the care plan section)  Progress towards OT goals: Not progressing toward goals - comment(pain limited, declined mobility)  Acute Rehab OT Goals Patient Stated Goal: To get better OT Goal Formulation: With patient Potential to Achieve Goals: Good  Plan Frequency remains appropriate;Discharge plan remains appropriate(will follow acutely, may need to update to SNF pending progress)     Co-evaluation                 AM-PAC OT "6 Clicks" Daily Activity     Outcome Measure   Help from another person eating meals?: None Help from another person taking care of personal grooming?: None Help from another person toileting, which includes using toliet, bedpan, or urinal?: A Lot Help from another person bathing (including washing, rinsing, drying)?: A Lot Help from another person to put on and taking off regular upper body clothing?: A Little Help from another person to put on and taking off regular lower body clothing?: A Lot 6 Click Score: 17    End of Session    OT Visit Diagnosis: Muscle weakness (generalized) (M62.81);Pain Pain - Right/Left: Left(both) Pain - part of body: Knee   Activity Tolerance Patient limited by pain   Patient Left in bed;with call bell/phone within reach;with bed alarm set;with nursing/sitter in room   Nurse Communication Patient requests pain meds        Time: 9381-8299 OT Time Calculation (min): 17 min  Charges: OT General Charges $OT Visit: 1 Visit OT Treatments $Therapeutic Activity: 8-22 mins  Richrd Prime, MPH, MS, OTR/L ascom [phone] 11/02/19, 11:07 AM

## 2019-11-02 NOTE — Evaluation (Signed)
Physical Therapy Evaluation Patient Details Name: Jeremiah MoralesKenneth D Hubner MRN: [id number] DOB: 06/21/1946 Today's Date: 11/02/2019   History of Present Illness  Pt is a 73 y.o. male presenting to hospital 10/29/19 s/p fall in living room.  Pt noted with hypoglycemia, elevated troponin, hypothermic, and bradycardic.  Pt admitted iwth acute exacerbation of chronic systolic CHF, acute NSTEMI, and probably sepsis with probably PNA.  Pt s/p cardiac cath 12/1.  PMH includes a-fib, CAD, CHF, DM, htn, bradycardia, B paresthesia of foot, CABG, cardioversion, L elbow and shoulder pain.  Clinical Impression  Prior to hospital admission, pt was independent with ambulation and lives alone.  Currently pt is 1-2 assist with bed mobility and only able to stand fully upright to walker 1x with 1 assist and bed height significantly elevated (pt fatigued with each repetition of standing).  Generalized weakness noted as well as B knee pain (pt reports from arthritis) with activities.  Pt would benefit from skilled PT to address noted impairments and functional limitations (see below for any additional details).  Upon hospital discharge, pt would benefit from STR.    Follow Up Recommendations SNF    Equipment Recommendations  Rolling walker with 5" wheels;3in1 (PT);Wheelchair (measurements PT);Wheelchair cushion (measurements PT)    Recommendations for Other Services OT consult     Precautions / Restrictions Precautions Precautions: Fall Restrictions Weight Bearing Restrictions: No      Mobility  Bed Mobility Overal bed mobility: Needs Assistance Bed Mobility: Supine to Sit;Sit to Supine     Supine to sit: Mod assist;Max assist;HOB elevated Sit to supine: +2 for physical assistance   General bed mobility comments: assist for B LE's and trunk semi-supine to sitting edge of bed; vc's, increased effort and assist using bed linen to scoot to edge of bed; 2 assist for trunk and B LE's sit to supine and to boost up  in bed end of session  Transfers Overall transfer level: Needs assistance Equipment used: Rolling walker (2 wheeled) Transfers: Sit to/from Stand Sit to Stand: Total assist         General transfer comment: 1st trial pt unable to fully stand up to walker with 1 assist and bed height mildly elevated (pt partially standing and needing to sit down d/t feeling L knee was going to buckle); 2nd trial pt unable to stand with 1 assist but able to stand 3rd trial with mod to max assist x1 and bed height signfiicantly elevated (pt still in mildly flexed posture with significant UE support on walker); 4th-6th trial standing pt unable to fully stand with 1 assist and bed height elevated  Ambulation/Gait Ambulation/Gait assistance: Mod assist Gait Distance (Feet): 0 Feet Assistive device: Rolling walker (2 wheeled)       General Gait Details: pt able to take 1 small step with R LE with heavy UE support through RW and L knee blocked  Stairs            Wheelchair Mobility    Modified Rankin (Stroke Patients Only)       Balance Overall balance assessment: Needs assistance Sitting-balance support: No upper extremity supported;Feet supported Sitting balance-Leahy Scale: Good Sitting balance - Comments: steady sitting reaching within BOS   Standing balance support: Bilateral upper extremity supported Standing balance-Leahy Scale: Poor Standing balance comment: heavy UE support through RW to maintain standing with 1 assist and L knee blocked  Pertinent Vitals/Pain Pain Assessment: Faces Faces Pain Scale: Hurts little more Pain Location: B knees secondary to arthritis; abdomen d/t "gas" Pain Descriptors / Indicators: Aching;Sore Pain Intervention(s): Limited activity within patient's tolerance;Monitored during session;Repositioned  Vitals (HR and O2 on supplemental O2 via nasal cannula) stable and WFL throughout treatment session.    Home  Living Family/patient expects to be discharged to:: Private residence Living Arrangements: Alone Available Help at Discharge: Neighbor Type of Home: House Home Access: Level entry     [place]: One [place]: Grab bars - toilet;Grab bars - tub/shower;Shower seat - built in      Prior Function Level of Independence: Independent      ADL's / Homemaking Assistance Needed: Independent with ADL's, IADL's, driving, medication management.  House cleaning assist every 2 weeks.        Hand Dominance   Dominant Hand: Right    Extremity/Trunk Assessment   Upper Extremity Assessment Upper Extremity Assessment: Generalized weakness    Lower Extremity Assessment Lower Extremity Assessment: Generalized weakness    Cervical / Trunk Assessment Cervical / Trunk Assessment: Normal  Communication   Communication: No difficulties  Cognition Arousal/Alertness: Awake/alert Behavior During Therapy: WFL for tasks assessed/performed Overall Cognitive Status: Within Functional Limits for tasks assessed                                        General Comments General comments (skin integrity, edema, etc.): bandages L hand and R upper forearm (per chart d/t skin tears).  Nursing cleared pt for participation in physical therapy.  Pt agreeable to PT session.    Exercises Total Joint Exercises Ankle Circles/Pumps: AROM;Strengthening;Both;10 reps;Supine Quad Sets: AROM;Strengthening;Both;10 reps;Supine Heel Slides: AAROM;Strengthening;Both;10 reps;Supine Hip ABduction/ADduction: AAROM;Strengthening;Both;10 reps;Supine   Assessment/Plan    PT Assessment Patient needs continued PT services  PT Problem List Decreased strength;Decreased activity tolerance;Decreased balance;Decreased mobility;Decreased knowledge of use of DME;Pain;Decreased skin integrity       PT Treatment Interventions DME instruction;Gait training;Functional mobility training;Therapeutic  activities;Therapeutic exercise;Balance training;Patient/family education    PT Goals (Current goals can be found in the Care Plan section)  Acute Rehab PT Goals Patient Stated Goal: to improve mobility PT Goal Formulation: With patient Time For Goal Achievement: 11/16/19 Potential to Achieve Goals: Fair    Frequency Min 2X/week   Barriers to discharge Decreased caregiver support      Co-evaluation               AM-PAC PT "6 Clicks" Mobility  Outcome Measure Help needed turning from your back to your side while in a flat bed without using bedrails?: A Lot Help needed moving from lying on your back to sitting on the side of a flat bed without using bedrails?: A Lot Help needed moving to and from a bed to a chair (including a wheelchair)?: Total Help needed standing up from a chair using your arms (e.g., wheelchair or bedside chair)?: Total Help needed to walk in hospital room?: Total Help needed climbing 3-5 steps with a railing? : Total 6 Click Score: 8    End of Session Equipment Utilized During Treatment: Gait belt Activity Tolerance: Patient limited by fatigue Patient left: in bed;with call bell/phone within reach;with bed alarm set;Other (comment)(B heels floating via pillow) Nurse Communication: Mobility status;Precautions PT Visit Diagnosis: Other abnormalities of gait and mobility (R26.89);Muscle weakness (generalized) (M62.81);History of falling (Z91.81);Difficulty in walking, not elsewhere classified (R26.2);Pain  Pain - Right/Left: Right(Left) Pain - part of body: Knee    Time: 1445-1539 PT Time Calculation (min) (ACUTE ONLY): 54 min   Charges:   PT Evaluation $PT Eval Low Complexity: 1 Low PT Treatments $Therapeutic Activity: 23-37 mins       Hendricks Limes, PT 11/02/19, 4:16 PM

## 2019-11-02 NOTE — Care Management Important Message (Signed)
Important Message  Patient Details  Name: RENJI BERWICK MRN: [id number] Date of Birth: 1946/10/03   Medicare Important Message Given:  Yes     Dannette Barbara 11/02/2019, 8:17 AM

## 2019-11-03 LAB — BASIC METABOLIC PANEL
Anion gap: 11 (ref 5–15)
BUN: 41 mg/dL — ABNORMAL HIGH (ref 8–23)
CO2: 25 mmol/L (ref 22–32)
Calcium: 8.4 mg/dL — ABNORMAL LOW (ref 8.9–10.3)
Chloride: 98 mmol/L (ref 98–111)
Creatinine, Ser: 1.57 mg/dL — ABNORMAL HIGH (ref 0.61–1.24)
GFR calc Af Amer: 49 mL/min — ABNORMAL LOW (ref >60)
GFR calc non Af Amer: 42 mL/min — ABNORMAL LOW (ref >60)
Glucose, Bld: 193 mg/dL — ABNORMAL HIGH (ref 70–99)
Potassium: 4 mmol/L (ref 3.5–5.1)
Sodium: 134 mmol/L — ABNORMAL LOW (ref 135–145)

## 2019-11-03 LAB — GLUCOSE, CAPILLARY
Glucose-Capillary: 185 mg/dL — ABNORMAL HIGH (ref 70–99)
Glucose-Capillary: 203 mg/dL — ABNORMAL HIGH (ref 70–99)
Glucose-Capillary: 194 mg/dL — ABNORMAL HIGH (ref 70–99)
Glucose-Capillary: 319 mg/dL — ABNORMAL HIGH (ref 70–99)

## 2019-11-03 LAB — CBC
HCT: 41.3 % (ref 39.0–52.0)
Hemoglobin: 13.9 g/dL (ref 13.0–17.0)
MCH: 30.8 pg (ref 26.0–34.0)
MCHC: 33.7 g/dL (ref 30.0–36.0)
MCV: 91.4 fL (ref 80.0–100.0)
Platelets: 209 10*3/uL (ref 150–400)
RBC: 4.52 MIL/uL (ref 4.22–5.81)
RDW: 13.6 % (ref 11.5–15.5)
WBC: 13.8 10*3/uL — ABNORMAL HIGH (ref 4.0–10.5)
nRBC: 0 % (ref 0.0–0.2)

## 2019-11-03 LAB — CULTURE, BLOOD (ROUTINE X 2)
Culture: NO GROWTH
Culture: NO GROWTH
Special Requests: ADEQUATE
Special Requests: ADEQUATE

## 2019-11-03 MED ORDER — INSULIN ASPART 100 UNIT/ML ~~LOC~~ SOLN
0.0000 [IU] | Freq: Every day | SUBCUTANEOUS | Status: DC
  Administered 2019-11-04: 3 [IU] via SUBCUTANEOUS
  Administered 2019-11-05: 2 [IU] via SUBCUTANEOUS
  Filled 2019-11-03 (×2): qty 1

## 2019-11-03 MED ORDER — INSULIN ASPART 100 UNIT/ML ~~LOC~~ SOLN
8.0000 [IU] | Freq: Once | SUBCUTANEOUS | Status: AC
  Administered 2019-11-03: 8 [IU] via SUBCUTANEOUS
  Filled 2019-11-03: qty 1

## 2019-11-03 MED ORDER — INSULIN ASPART 100 UNIT/ML ~~LOC~~ SOLN
0.0000 [IU] | Freq: Three times a day (TID) | SUBCUTANEOUS | Status: DC
  Administered 2019-11-04: 2 [IU] via SUBCUTANEOUS
  Administered 2019-11-04 – 2019-11-05 (×4): 3 [IU] via SUBCUTANEOUS
  Administered 2019-11-05: 2 [IU] via SUBCUTANEOUS
  Administered 2019-11-06: 3 [IU] via SUBCUTANEOUS
  Administered 2019-11-06: 2 [IU] via SUBCUTANEOUS
  Administered 2019-11-06: 3 [IU] via SUBCUTANEOUS
  Filled 2019-11-03 (×9): qty 1

## 2019-11-03 NOTE — Plan of Care (Signed)
  Problem: Education: Goal: Knowledge of General Education information will improve Description: Including pain rating scale, medication(s)/side effects and non-pharmacologic comfort measures Outcome: Progressing   Problem: Health Behavior/Discharge Planning: Goal: Ability to manage health-related needs will improve Outcome: Progressing   Problem: Clinical Measurements: Goal: Ability to maintain clinical measurements within normal limits will improve Outcome: Progressing Goal: Will remain free from infection Outcome: Progressing Goal: Diagnostic test results will improve Outcome: Progressing Goal: Respiratory complications will improve Outcome: Progressing Goal: Cardiovascular complication will be avoided Outcome: Progressing   Problem: Activity: Goal: Risk for activity intolerance will decrease Outcome: Progressing   Problem: Nutrition: Goal: Adequate nutrition will be maintained Outcome: Progressing   Problem: Coping: Goal: Level of anxiety will decrease Outcome: Progressing   Problem: Elimination: Goal: Will not experience complications related to bowel motility Outcome: Progressing Goal: Will not experience complications related to urinary retention Outcome: Progressing   Problem: Pain Managment: Goal: General experience of comfort will improve Outcome: Progressing   Problem: Safety: Goal: Ability to remain free from injury will improve Outcome: Progressing   Problem: Skin Integrity: Goal: Risk for impaired skin integrity will decrease Outcome: Progressing   Problem: Education: Goal: Knowledge of General Education information will improve Description: Including pain rating scale, medication(s)/side effects and non-pharmacologic comfort measures Outcome: Progressing   Problem: Health Behavior/Discharge Planning: Goal: Ability to manage health-related needs will improve Outcome: Progressing   Problem: Clinical Measurements: Goal: Ability to maintain  clinical measurements within normal limits will improve Outcome: Progressing Goal: Will remain free from infection Outcome: Progressing Goal: Diagnostic test results will improve Outcome: Progressing Goal: Respiratory complications will improve Outcome: Progressing Goal: Cardiovascular complication will be avoided Outcome: Progressing   Problem: Activity: Goal: Risk for activity intolerance will decrease Outcome: Progressing   Problem: Nutrition: Goal: Adequate nutrition will be maintained Outcome: Progressing   Problem: Coping: Goal: Level of anxiety will decrease Outcome: Progressing   Problem: Elimination: Goal: Will not experience complications related to bowel motility Outcome: Progressing Goal: Will not experience complications related to urinary retention Outcome: Progressing   Problem: Pain Managment: Goal: General experience of comfort will improve Outcome: Progressing   Problem: Safety: Goal: Ability to remain free from injury will improve Outcome: Progressing   Problem: Skin Integrity: Goal: Risk for impaired skin integrity will decrease Outcome: Progressing   Problem: Education: Goal: Understanding of CV disease, CV risk reduction, and recovery process will improve Outcome: Progressing Goal: Individualized Educational Video(s) Outcome: Progressing   Problem: Activity: Goal: Ability to return to baseline activity level will improve Outcome: Progressing   Problem: Cardiovascular: Goal: Ability to achieve and maintain adequate cardiovascular perfusion will improve Outcome: Progressing Goal: Vascular access site(s) Level 0-1 will be maintained Outcome: Progressing   Problem: Health Behavior/Discharge Planning: Goal: Ability to safely manage health-related needs after discharge will improve Outcome: Progressing   

## 2019-11-03 NOTE — Progress Notes (Signed)
[facility] Progress Note  Patient: Jeremiah Chapman WUJ:[id number]   PCP: Dorothey Baseman, MD DOB: [birth year]   DOA: 10/29/2019   DOS: 11/03/2019   Date of Service: the patient was seen and examined on 11/03/2019  Chief Complaint  Patient presents with  . Fall  . Hypoglycemia   Brief hospital course: Jeremiah Chapman is an 73 y.o. male with medical history significant for COPD, atrial fibrillation, chronic systolic CHF with EF of 40%, type 2 diabetes mellitus, obesity (BMI 30.4), CKD stage III.  He was brought to the hospital because of altered mental status/lethargy.  Apparently, he was found on his kitchen floor by a neighbor.  Reportedly, his blood glucose level was 50 when EMS arrived at the scene.  In the emergency room, his blood glucose was 13 and he was hypothermic and bradycardic.  In the ED, he also had complaints of increasing shortness of breath.  Currently further plan is to treat constipation, increase diuresis, await for safe discharge at SNF.  Subjective: Continues to have generalized weakness and fatigue.  Continues to have shortness of breath but feels somewhat better.  Continues to have constipation without any improvement.  No nausea no vomiting.  Oral intake remains minimal.  Assessment and Plan: Scheduled Meds: . apixaban  5 mg Oral BID  . aspirin EC  81 mg Oral Daily  . atorvastatin  80 mg Oral Daily  . furosemide  20 mg Oral BID  . gabapentin  300 mg Oral QHS  . polyethylene glycol  17 g Oral Daily  . senna-docusate  2 tablet Oral BID  . sodium chloride flush  3 mL Intravenous Q12H   Continuous Infusions: . sodium chloride 250 mL (10/31/19 0132)  . cefTRIAXone (ROCEPHIN)  IV 2 g (11/03/19 0916)   PRN Meds: sodium chloride, acetaminophen **OR** acetaminophen, albuterol, ondansetron **OR** ondansetron (ZOFRAN) IV  Acute on chronic systolic CHF BNP was 1,511.   Patient was treated with IV Lasix. back to home Lasix 20 mg twice daily. Increase the dose  of Lasix to 40 mg twice daily  Acute non-ST elevation MI:  Continue aspirin and Lipitor.   Metoprolol on hold because of bradycardia.   Resume home dose of Eliquis.   S/p left heart cath with report as listed below "Patent graft to ramus (obtuse marginal) Patent LIMA graft to LAD Moderate atherosclerosis of right coronary artery Patent stent of proximal left circumflex artery Patent native left sided PDA Severe stenosis of obtuse marginal 2 with complicated takeoff and stenoses with collaterals from grafted obtuse marginal and/or ramus which appears to be the culprit of non-ST elevation myocardial infarction  Plan No further cardiac intervention at this time due to collateral flow to obtuse marginal with infarct High intensity cholesterol therapy Avoid beta-blocker due to bradycardia and possible syncope Avoid ACE inhibitor due to severe chronic kidney disease stage iii Begin ambulation and cardiac rehabilitation and further evaluation of other possible syncopal episode issues including carotid atherosclerosis"   Right pleural effusion. Ultrasound paracentesis ordered on 11/01/2019 was canceled as the patient does not have significant amount of pleural fluid. Patient continues to remain short of breath although chest x-ray negative and normal pleural effusion that can be drained on ultrasound. Possibly abdominal etiology with constipation.    Likely constipation.  Constipation. Bowel regimen initiated.  Improving Continue to treat.  With current regimen  Probable sepsis with probable pneumonia as the likely etiology Sepsis ruled out. Suspected his presenting clinical features were most likely from acute  MI complicated by acute heart failure exacerbation.   Repeat chest x-ray shows no significant pneumonia and no significant CHF as well. So far no growth on the blood cultures. Patient has already finished 7 days of IV antibiotics will discontinue antibiotics.  Type 2 diabetes  mellitus uncontrolled with hypoglycemia with vascular and renal complication Glimepiride, Metformin and degludec have been held.   NovoLog as needed for hyperglycemia.   Hemoglobin A1c 6.5 Gentle hydration with D5 for now given patient's persistent hypoglycemia. Continue to monitor CBG AC at bedtime. Continue diet.  Metabolic encephalopathy: Resolved  CKD stage III b Creatinine is around his baseline.   Patient received IV fluids because he underwent left heart catheterization Monitor BMP. Avoid nephrotoxic medications  Chronic atrial fibrillation: Resume Eliquis.  Of note, patient said that he was taking Eliquis at home up until this admission.  COPD: Stable  We will provide incentive spirometry to treat patient's reported shortness of breath.  Examination does not reveal any acute abnormality nor the chest x-ray findings suggest any acute abnormality.  Diet: Cardiac diet  DVT Prophylaxis: Therapeutic Anticoagulation with Eliquis   Advance goals of care discussion: Full code  Family Communication: no family was present at bedside, at the time of interview.   Disposition:  Discharge to SNF.  Consultants: Cardiology Procedures: Cardiac Catheterization, Echocardiogram   Antibiotics: Anti-infectives (From admission, onward)   Start     Dose/Rate Route Frequency Ordered Stop   11/02/19 1000  cefTRIAXone (ROCEPHIN) 2 g in sodium chloride 0.9 % 100 mL IVPB     2 g 200 mL/hr over 30 Minutes Intravenous Every 24 hours 11/01/19 1023     10/31/19 1000  cefdinir (OMNICEF) capsule 300 mg  Status:  Discontinued     300 mg Oral Every 12 hours 10/31/19 0832 11/01/19 1019   10/31/19 1000  azithromycin (ZITHROMAX) tablet 500 mg     500 mg Oral Daily 10/31/19 0832 11/02/19 0948   10/30/19 1700  metroNIDAZOLE (FLAGYL) IVPB 500 mg  Status:  Discontinued     500 mg 100 mL/hr over 60 Minutes Intravenous Every 8 hours 10/30/19 1623 10/31/19 0827   10/30/19 1630  ceFEPIme (MAXIPIME) 2 g  in sodium chloride 0.9 % 100 mL IVPB  Status:  Discontinued     2 g 200 mL/hr over 30 Minutes Intravenous Every 12 hours 10/30/19 1623 10/31/19 0827   10/30/19 0748  vancomycin variable dose per unstable renal function (pharmacist dosing)  Status:  Discontinued      Does not apply See admin instructions 10/30/19 0749 10/30/19 1639   10/30/19 0600  vancomycin (VANCOCIN) IVPB 1000 mg/200 mL premix  Status:  Discontinued     1,000 mg 200 mL/hr over 60 Minutes Intravenous Every 24 hours 10/29/19 0302 10/30/19 0749   10/29/19 1000  ceFEPIme (MAXIPIME) 2 g in sodium chloride 0.9 % 100 mL IVPB  Status:  Discontinued     2 g 200 mL/hr over 30 Minutes Intravenous Every 8 hours 10/29/19 0253 10/29/19 0918   10/29/19 1000  ceFEPIme (MAXIPIME) 2 g in sodium chloride 0.9 % 100 mL IVPB  Status:  Discontinued     2 g 200 mL/hr over 30 Minutes Intravenous Every 12 hours 10/29/19 0918 10/30/19 1623   10/29/19 0330  vancomycin (VANCOCIN) IVPB 1000 mg/200 mL premix     1,000 mg 200 mL/hr over 60 Minutes Intravenous  Once 10/29/19 0246 10/29/19 1156   10/29/19 0300  ceFEPIme (MAXIPIME) 2 g in sodium chloride 0.9 % 100  mL IVPB  Status:  Discontinued     2 g 200 mL/hr over 30 Minutes Intravenous  Once 10/29/19 0246 10/29/19 0255   10/29/19 0300  metroNIDAZOLE (FLAGYL) IVPB 500 mg  Status:  Discontinued     500 mg 100 mL/hr over 60 Minutes Intravenous Every 8 hours 10/29/19 0246 10/30/19 1623   10/29/19 0115  ceFEPIme (MAXIPIME) 2 g in sodium chloride 0.9 % 100 mL IVPB     2 g 200 mL/hr over 30 Minutes Intravenous  Once 10/29/19 0104 10/29/19 0204   10/29/19 0115  metroNIDAZOLE (FLAGYL) IVPB 500 mg  Status:  Discontinued     500 mg 100 mL/hr over 60 Minutes Intravenous  Once 10/29/19 0104 10/29/19 0256   10/29/19 0115  vancomycin (VANCOCIN) IVPB 1000 mg/200 mL premix     1,000 mg 200 mL/hr over 60 Minutes Intravenous  Once 10/29/19 0104 10/29/19 0307       Objective: Physical Exam: Vitals:   11/02/19  1459 11/02/19 1947 11/03/19 0426 11/03/19 0904  BP: 129/60 (!) 160/89 122/64 (!) 105/47  Pulse: 80 63 79 67  Resp:  20 (!) 22 20  Temp: 97.7 F (36.5 C) 98.1 F (36.7 C) (!) 97.5 F (36.4 C) 98.8 F (37.1 C)  TempSrc: Oral Oral Oral Oral  SpO2: 99% 98% 100% 100%  Weight:   94 kg   Height:        Intake/Output Summary (Last 24 hours) at 11/03/2019 1251 Last data filed at 11/03/2019 0900 Gross per 24 hour  Intake 240 ml  Output 1550 ml  Net -1310 ml   Filed Weights   11/01/19 0417 11/02/19 0450 11/03/19 0426  Weight: 95.1 kg 97.8 kg 94 kg   General: alert and oriented to time, place, and person. Appear in moderate distress, affect appropriate Eyes: PERRL, Conjunctiva normal ENT: Oral Mucosa Clear, moist  Neck: difficult to assess  JVD, no Abnormal Mass Or lumps Cardiovascular: S1 and S2 Present, no Murmur,  Respiratory: good respiratory effort, Bilateral Air entry equal and Decreased, no signs of accessory muscle use, bilateral  Crackles, no wheezes Abdomen: Bowel Sound present, Soft and distended, no tenderness, no hernia Skin: no rashes  Extremities: no Pedal edema, no calf tenderness Neurologic: without any new focal findings Gait not checked due to patient safety concerns  Data Reviewed: I have personally reviewed and interpreted daily labs, tele strips, imagings as discussed above. I reviewed all nursing notes, pharmacy notes, vitals, pertinent old records I have discussed plan of care as described above with RN and patient/family.  CBC: Recent Labs  Lab 10/29/19 0301 10/30/19 0319 10/31/19 0500 11/01/19 0504 11/02/19 0431 11/03/19 0640  WBC 14.4* 10.5 10.9* 12.4* 14.7* 13.8*  NEUTROABS 12.6*  --  8.4*  --   --   --   HGB 13.4 12.8* 13.2 14.3 13.1 13.9  HCT 40.1 39.7 41.0 44.2 39.9 41.3  MCV 92.2 95.9 97.6 94.8 93.9 91.4  PLT 226 195 210 217 221 209   Basic Metabolic Panel: Recent Labs  Lab 10/30/19 0319 10/31/19 0500 11/01/19 0504 11/02/19 0431  11/03/19 0640  NA 138 139  138 139 133* 134*  K 4.1 4.3  4.1 4.5 4.1 4.0  CL 105 105  105 102 98 98  CO2 22 25  26 27 27 25   GLUCOSE 188* 112*  111* 99 112* 193*  BUN 38* 35*  36* 33* 33* 41*  CREATININE 1.81* 1.69*  1.68*  1.75* 1.60* 1.59* 1.57*  CALCIUM 8.4* 8.4*  8.4* 8.9 8.3* 8.4*  MG  --   --  1.9  --   --     Liver Function Tests: Recent Labs  Lab 10/29/19 0016 10/29/19 0301  AST 38 37  ALT 18 19  ALKPHOS 80 75  BILITOT 1.5* 1.8*  PROT 7.5 7.2  ALBUMIN 3.6 3.5   No results for input(s): LIPASE, AMYLASE in the last 168 hours. No results for input(s): AMMONIA in the last 168 hours. Coagulation Profile: Recent Labs  Lab 10/29/19 0301  INR 1.3*   Cardiac Enzymes: Recent Labs  Lab 10/29/19 0016  CKTOTAL 465*   BNP (last 3 results) No results for input(s): PROBNP in the last 8760 hours. CBG: Recent Labs  Lab 11/02/19 1151 11/02/19 1710 11/02/19 2029 11/03/19 0859 11/03/19 1150  GLUCAP 178* 194* 149* 185* 203*   Studies: No results found.   Time spent: 35 minutes  Author: Lynden OxfordPranav Patel, MD [facility] 11/03/2019 12:51 PM  To reach On-call, see care teams to locate the attending and reach out to them via [email or web address]. If 7PM-7AM, please contact night-coverage If you still have difficulty reaching the attending provider, please page the [facility] (Director on Call) for [facility] on amion for assistance.

## 2019-11-04 LAB — GLUCOSE, CAPILLARY
Glucose-Capillary: 195 mg/dL — ABNORMAL HIGH (ref 70–99)
Glucose-Capillary: 204 mg/dL — ABNORMAL HIGH (ref 70–99)
Glucose-Capillary: 232 mg/dL — ABNORMAL HIGH (ref 70–99)
Glucose-Capillary: 288 mg/dL — ABNORMAL HIGH (ref 70–99)

## 2019-11-04 LAB — CULTURE, RESPIRATORY W GRAM STAIN: Culture: NORMAL

## 2019-11-04 LAB — CBC
HCT: 39.3 % (ref 39.0–52.0)
Hemoglobin: 13.6 g/dL (ref 13.0–17.0)
MCH: 31.3 pg (ref 26.0–34.0)
MCHC: 34.6 g/dL (ref 30.0–36.0)
MCV: 90.3 fL (ref 80.0–100.0)
Platelets: 231 10*3/uL (ref 150–400)
RBC: 4.35 MIL/uL (ref 4.22–5.81)
RDW: 13.5 % (ref 11.5–15.5)
WBC: 11.5 10*3/uL — ABNORMAL HIGH (ref 4.0–10.5)
nRBC: 0 % (ref 0.0–0.2)

## 2019-11-04 LAB — BASIC METABOLIC PANEL
Anion gap: 9 (ref 5–15)
BUN: 41 mg/dL — ABNORMAL HIGH (ref 8–23)
CO2: 29 mmol/L (ref 22–32)
Calcium: 8.4 mg/dL — ABNORMAL LOW (ref 8.9–10.3)
Chloride: 97 mmol/L — ABNORMAL LOW (ref 98–111)
Creatinine, Ser: 1.48 mg/dL — ABNORMAL HIGH (ref 0.61–1.24)
GFR calc Af Amer: 53 mL/min — ABNORMAL LOW (ref >60)
GFR calc non Af Amer: 46 mL/min — ABNORMAL LOW (ref >60)
Glucose, Bld: 206 mg/dL — ABNORMAL HIGH (ref 70–99)
Potassium: 3.8 mmol/L (ref 3.5–5.1)
Sodium: 135 mmol/L (ref 135–145)

## 2019-11-04 NOTE — Progress Notes (Signed)
[facility] Progress Note  Patient: Jeremiah MoralesKenneth D Chapman ZOX:[id number]   PCP: [name], David, MD DOB: 11/06/1946   DOA: 10/29/2019   DOS: 11/04/2019   Date of Service: the patient was seen and examined on 11/04/2019  Chief Complaint  Patient presents with  . Fall  . Hypoglycemia   Brief hospital course: Jeremiah Chapman is an 73 y.o. male with medical history significant for COPD, atrial fibrillation, chronic systolic CHF with EF of 40%, type 2 diabetes mellitus, obesity (BMI 30.4), CKD stage III.  He was brought to the hospital because of altered mental status/lethargy.  Apparently, he was found on his kitchen floor by a neighbor.  Reportedly, his blood glucose level was 50 when EMS arrived at the scene.  In the emergency room, his blood glucose was 13 and he was hypothermic and bradycardic.  In the ED, he also had complaints of increasing shortness of breath.  Currently further [facility]  Subjective: still has generalized weakness and fatigue. Continues to have shortness of breath, no change from yesterday.  Constipation resolved.  No nausea no vomiting.  Oral intake remains minimal.  Assessment and Plan: Scheduled Meds: . apixaban  5 mg Oral BID  . aspirin EC  81 mg Oral Daily  . atorvastatin  80 mg Oral Daily  . furosemide  20 mg Oral BID  . gabapentin  300 mg Oral QHS  . insulin aspart  0-5 Units Subcutaneous QHS  . insulin aspart  0-9 Units Subcutaneous TID WC  . polyethylene glycol  17 g Oral Daily  . senna-docusate  2 tablet Oral BID  . sodium chloride flush  3 mL Intravenous Q12H   Continuous Infusions: . sodium chloride 250 mL (10/31/19 0132)   PRN Meds: sodium chloride, acetaminophen **OR** acetaminophen, albuterol, ondansetron **OR** ondansetron (ZOFRAN) IV  Acute on chronic systolic CHF BNP was 1,511.   Patient was treated with IV Lasix. back to home Lasix 20 mg twice daily.  Acute non-ST elevation MI:  Continue aspirin and  Lipitor.   Metoprolol on hold because of bradycardia.   Resume home dose of Eliquis.   S/p left heart cath with report as listed below "Patent graft to ramus (obtuse marginal) Patent LIMA graft to LAD Moderate atherosclerosis of right coronary artery Patent stent of proximal left circumflex artery Patent native left sided PDA Severe stenosis of obtuse marginal 2 with complicated takeoff and stenoses with collaterals from grafted obtuse marginal and/or ramus which appears to be the culprit of non-ST elevation myocardial infarction  Plan No further cardiac intervention at this time due to collateral flow to obtuse marginal with infarct High intensity cholesterol therapy Avoid beta-blocker due to bradycardia and possible syncope Avoid ACE inhibitor due to severe chronic kidney disease stage iii Begin ambulation and cardiac rehabilitation and further evaluation of other possible syncopal episode issues including carotid atherosclerosis"   Right pleural effusion. Ultrasound paracentesis ordered on 11/01/2019 was canceled as the patient does not have significant amount of pleural fluid. Patient continues to remain short of breath although chest x-ray negative and normal pleural effusion that can be drained on ultrasound. Possibly abdominal etiology with constipation.    Likely constipation.  Constipation. Bowel regimen initiated.  Improving Continue to treat.  Reduce the strength.   Probable sepsis with probable pneumonia as the likely etiology Sepsis ruled out. Suspected his presenting clinical features were most likely from acute MI complicated by acute heart failure exacerbation.   Repeat chest  x-ray shows no significant pneumonia and no significant CHF as well. So far no growth on the blood cultures. Patient has already finished 7 days of IV antibiotics will discontinue antibiotics.  Type 2 diabetes mellitus uncontrolled with hypoglycemia with vascular and renal complication  Glimepiride, Metformin and degludec have been held.  NovoLog as needed for hyperglycemia.   Hemoglobin A1c 6.5 Pt actually had persistent hypoglycemia and needed d5 drip.   Metabolic encephalopathy: Resolved  CKD stage III b Creatinine is around his baseline.   Patient received IV fluids because he underwent left heart catheterization Monitor BMP. Avoid nephrotoxic medications  Chronic atrial fibrillation: Resume Eliquis.  Of note, patient said that he was taking Eliquis at home up until this admission.  COPD: Stable  We will provide incentive spirometry to treat patient's reported shortness of breath.  Examination does not reveal any acute abnormality nor the chest x-ray findings suggest any acute abnormality.  Diet: Cardiac diet  DVT Prophylaxis: Therapeutic Anticoagulation with Eliquis   Advance goals of care discussion: Full code  Family Communication: no family was present at bedside, at the time of interview.   Disposition:  Discharge to SNF. Unable to bear weight, unable to ambulate, has CHF and on anticoagulation. Unsafe to d/c home. And high risk of injury, death.   Consultants: Cardiology Procedures: Cardiac Catheterization, Echocardiogram   Antibiotics: Anti-infectives (From admission, onward)   Start     Dose/Rate Route Frequency Ordered Stop   11/02/19 1000  cefTRIAXone (ROCEPHIN) 2 g in sodium chloride 0.9 % 100 mL IVPB  Status:  Discontinued     2 g 200 mL/hr over 30 Minutes Intravenous Every 24 hours 11/01/19 1023 11/03/19 1255   10/31/19 1000  cefdinir (OMNICEF) capsule 300 mg  Status:  Discontinued     300 mg Oral Every 12 hours 10/31/19 0832 11/01/19 1019   10/31/19 1000  azithromycin (ZITHROMAX) tablet 500 mg     500 mg Oral Daily 10/31/19 0832 11/02/19 0948   10/30/19 1700  metroNIDAZOLE (FLAGYL) IVPB 500 mg  Status:  Discontinued     500 mg 100 mL/hr over 60 Minutes Intravenous Every 8 hours 10/30/19 1623 10/31/19 0827   10/30/19 1630  ceFEPIme  (MAXIPIME) 2 g in sodium chloride 0.9 % 100 mL IVPB  Status:  Discontinued     2 g 200 mL/hr over 30 Minutes Intravenous Every 12 hours 10/30/19 1623 10/31/19 0827   10/30/19 0748  vancomycin variable dose per unstable renal function (pharmacist dosing)  Status:  Discontinued      Does not apply See admin instructions 10/30/19 0749 10/30/19 1639   10/30/19 0600  vancomycin (VANCOCIN) IVPB 1000 mg/200 mL premix  Status:  Discontinued     1,000 mg 200 mL/hr over 60 Minutes Intravenous Every 24 hours 10/29/19 0302 10/30/19 0749   10/29/19 1000  ceFEPIme (MAXIPIME) 2 g in sodium chloride 0.9 % 100 mL IVPB  Status:  Discontinued     2 g 200 mL/hr over 30 Minutes Intravenous Every 8 hours 10/29/19 0253 10/29/19 0918   10/29/19 1000  ceFEPIme (MAXIPIME) 2 g in sodium chloride 0.9 % 100 mL IVPB  Status:  Discontinued     2 g 200 mL/hr over 30 Minutes Intravenous Every 12 hours 10/29/19 0918 10/30/19 1623   10/29/19 0330  vancomycin (VANCOCIN) IVPB 1000 mg/200 mL premix     1,000 mg 200 mL/hr over 60 Minutes Intravenous  Once 10/29/19 0246 10/29/19 1156   10/29/19 0300  ceFEPIme (MAXIPIME) 2 g  in sodium chloride 0.9 % 100 mL IVPB  Status:  Discontinued     2 g 200 mL/hr over 30 Minutes Intravenous  Once 10/29/19 0246 10/29/19 0255   10/29/19 0300  metroNIDAZOLE (FLAGYL) IVPB 500 mg  Status:  Discontinued     500 mg 100 mL/hr over 60 Minutes Intravenous Every 8 hours 10/29/19 0246 10/30/19 1623   10/29/19 0115  ceFEPIme (MAXIPIME) 2 g in sodium chloride 0.9 % 100 mL IVPB     2 g 200 mL/hr over 30 Minutes Intravenous  Once 10/29/19 0104 10/29/19 0204   10/29/19 0115  metroNIDAZOLE (FLAGYL) IVPB 500 mg  Status:  Discontinued     500 mg 100 mL/hr over 60 Minutes Intravenous  Once 10/29/19 0104 10/29/19 0256   10/29/19 0115  vancomycin (VANCOCIN) IVPB 1000 mg/200 mL premix     1,000 mg 200 mL/hr over 60 Minutes Intravenous  Once 10/29/19 0104 10/29/19 0307       Objective: Physical Exam:  Vitals:   11/03/19 1500 11/03/19 2109 11/04/19 0442 11/04/19 0747  BP: 126/68 (!) 146/70 119/61 (!) 142/65  Pulse: 81 71 76 70  Resp: 20 19 18    Temp: 97.8 F (36.6 C) 99.3 F (37.4 C) 98 F (36.7 C) 97.9 F (36.6 C)  TempSrc: Oral Oral Oral Oral  SpO2: 100% 97% 96% 95%  Weight:      Height:        Intake/Output Summary (Last 24 hours) at 11/04/2019 1033 Last data filed at 11/04/2019 0900 Gross per 24 hour  Intake 840 ml  Output 1650 ml  Net -810 ml   Filed Weights   11/01/19 0417 11/02/19 0450 11/03/19 0426  Weight: 95.1 kg 97.8 kg 94 kg   General: alert and oriented to time, place, and person. Appear in mild distress, affect appropriate Eyes: PERRL, Conjunctiva normal ENT: Oral Mucosa Clear, moist  Neck: no JVD, no Abnormal Mass Or lumps Cardiovascular: S1 and S2 Present, no Murmur, peripheral pulses symmetrical Respiratory: increased respiratory effort, Bilateral Air entry equal and Decreased, no signs of accessory muscle use, faint bilateral  Crackles, no wheezes Abdomen: Bowel Sound present, Soft and no tenderness, no hernia Skin: no rashes  Extremities: no Pedal edema, no calf tenderness Neurologic: without any new focal findings Gait not checked due to patient safety concerns   Data Reviewed: I have personally reviewed and interpreted daily labs, tele strips, imagings as discussed above. I reviewed all nursing notes, pharmacy notes, vitals, pertinent old records I have discussed plan of care as described above with RN and patient/family.  CBC: Recent Labs  Lab 10/29/19 0301  10/31/19 0500 11/01/19 0504 11/02/19 0431 11/03/19 0640 11/04/19 0516  WBC 14.4*   < > 10.9* 12.4* 14.7* 13.8* 11.5*  NEUTROABS 12.6*  --  8.4*  --   --   --   --   HGB 13.4   < > 13.2 14.3 13.1 13.9 13.6  HCT 40.1   < > 41.0 44.2 39.9 41.3 39.3  MCV 92.2   < > 97.6 94.8 93.9 91.4 90.3  PLT 226   < > 210 217 221 209 231   < > = values in this interval not displayed.   Basic  Metabolic Panel: Recent Labs  Lab 10/31/19 0500 11/01/19 0504 11/02/19 0431 11/03/19 0640 11/04/19 0516  NA 139  138 139 133* 134* 135  K 4.3  4.1 4.5 4.1 4.0 3.8  CL 105  105 102 98 98 97*  CO2 25  GLUCOSE 112*  111* 99 112* 193* 206*  BUN 35*  36* 33* 33* 41* 41*  CREATININE 1.69*  1.68*  1.75* 1.60* 1.59* 1.57* 1.48*  CALCIUM 8.4*  8.4* 8.9 8.3* 8.4* 8.4*  MG  --  1.9  --   --   --     Liver Function Tests: Recent Labs  Lab 10/29/19 0016 10/29/19 0301  AST 38 37  ALT 18 19  ALKPHOS 80 75  BILITOT 1.5* 1.8*  PROT 7.5 7.2  ALBUMIN 3.6 3.5   No results for input(s): LIPASE, AMYLASE in the last 168 hours. No results for input(s): AMMONIA in the last 168 hours. Coagulation Profile: Recent Labs  Lab 10/29/19 0301  INR 1.3*   Cardiac Enzymes: Recent Labs  Lab 10/29/19 0016  CKTOTAL 465*   BNP (last 3 results) No results for input(s): PROBNP in the last 8760 hours. CBG: Recent Labs  Lab 11/03/19 0859 11/03/19 1150 11/03/19 1634 11/03/19 2129 11/04/19 0749  GLUCAP 185* 203* 194* 319* 195*   Studies: No results found.   Time spent: 35 minutes  Author: Lynden Oxford, MD [facility] 11/04/2019 10:33 AM  To reach On-call, see care teams to locate the attending and reach out to them via [email or web address]. If 7PM-7AM, please contact night-coverage If you still have difficulty reaching the attending provider, please page the [facility] (Director on Call) for [facility] on amion for assistance.

## 2019-11-05 LAB — GLUCOSE, CAPILLARY
Glucose-Capillary: 157 mg/dL — ABNORMAL HIGH (ref 70–99)
Glucose-Capillary: 211 mg/dL — ABNORMAL HIGH (ref 70–99)
Glucose-Capillary: 245 mg/dL — ABNORMAL HIGH (ref 70–99)
Glucose-Capillary: 251 mg/dL — ABNORMAL HIGH (ref 70–99)
Glucose-Capillary: 220 mg/dL — ABNORMAL HIGH (ref 70–99)

## 2019-11-05 LAB — BASIC METABOLIC PANEL
Anion gap: 10 (ref 5–15)
BUN: 36 mg/dL — ABNORMAL HIGH (ref 8–23)
CO2: 30 mmol/L (ref 22–32)
Calcium: 8.5 mg/dL — ABNORMAL LOW (ref 8.9–10.3)
Chloride: 96 mmol/L — ABNORMAL LOW (ref 98–111)
Creatinine, Ser: 1.37 mg/dL — ABNORMAL HIGH (ref 0.61–1.24)
GFR calc Af Amer: 58 mL/min — ABNORMAL LOW (ref >60)
GFR calc non Af Amer: 50 mL/min — ABNORMAL LOW (ref >60)
Glucose, Bld: 196 mg/dL — ABNORMAL HIGH (ref 70–99)
Potassium: 3.9 mmol/L (ref 3.5–5.1)
Sodium: 136 mmol/L (ref 135–145)

## 2019-11-05 LAB — SARS CORONAVIRUS 2 (TAT 6-24 HRS): SARS Coronavirus 2: NEGATIVE

## 2019-11-05 LAB — CBC
HCT: 41.6 % (ref 39.0–52.0)
Hemoglobin: 13.7 g/dL (ref 13.0–17.0)
MCH: 30.7 pg (ref 26.0–34.0)
MCHC: 32.9 g/dL (ref 30.0–36.0)
MCV: 93.3 fL (ref 80.0–100.0)
Platelets: 278 10*3/uL (ref 150–400)
RBC: 4.46 MIL/uL (ref 4.22–5.81)
RDW: 13.4 % (ref 11.5–15.5)
WBC: 10.9 10*3/uL — ABNORMAL HIGH (ref 4.0–10.5)
nRBC: 0 % (ref 0.0–0.2)

## 2019-11-05 MED ORDER — SENNOSIDES-DOCUSATE SODIUM 8.6-50 MG PO TABS
2.0000 | ORAL_TABLET | Freq: Every day | ORAL | Status: DC
  Administered 2019-11-05: 2 via ORAL
  Filled 2019-11-05: qty 2

## 2019-11-05 MED ORDER — TRAMADOL HCL 50 MG PO TABS
50.0000 mg | ORAL_TABLET | Freq: Two times a day (BID) | ORAL | Status: DC | PRN
  Administered 2019-11-05 – 2019-11-06 (×3): 50 mg via ORAL
  Filled 2019-11-05 (×4): qty 1

## 2019-11-05 NOTE — Consult Note (Addendum)
[facility] Nurse Consult Note: Patient receiving care in [facility]. Reason for Consult: "MASD to buttocks, possible pressure wound, scattered abrasions" Wound type: skin tears to BUE for which I have enacted the skin tear orders of the Skin Order Set. Pressure Injury POA: No Measurement: 1 cm x 3 cm to right buttock Wound bed: maroon, overlying tissue intact, non-blanchable Drainage (amount, consistency, odor) none Periwound: normal color and texture, blanchable Dressing procedure/placement/frequency: Turn q 2 hours, avoid the supine position, monitor the area, Use ONLY ONE DermaTherapy pad beneath patient's buttocks. Do NOT use disposable pads.  Patient admits he has not been turning.  I explained the importance of turning and allowing the staff to assist him with turning. Monitor the wound area(s) for worsening of condition such as: Signs/symptoms of infection,  Increase in size,  Development of or worsening of odor, Development of pain, or increased pain at the affected locations.  Notify the medical team if any of these develop.  Thank you for the consult.  Discussed plan of care with the patient and bedside nurse.  [facility] nurse will not follow at this time.  Please re-consult the [facility] team if needed.  Val Riles, RN, MSN, CWOCN, CNS-BC, pager [phone]

## 2019-11-05 NOTE — NC FL2 (Signed)
[place] MEDICAID FL2 LEVEL OF CARE SCREENING TOOL     IDENTIFICATION  Patient Name: Jeremiah Chapman Birthdate: Aug 15, 1946 Sex: male Admission Date (Current Location): 10/29/2019  [place] and [place] Number:  Chiropodist and Address:  [facility], [address] [place], [place] [address]      Provider Number: [id number]  Attending Physician Name and Address:  Rolly Salter, MD  Relative Name and Phone Number:  Deondra, Wigger [phone]  [phone]    Current Level of Care: Hospital Recommended Level of Care: Skilled Nursing Facility Prior Approval Number:    Date Approved/Denied:   PASRR Number: [id number]  Discharge Plan: SNF    Current Diagnoses: Patient Active Problem List   Diagnosis Date Noted  . Hypoglycemia 10/29/2019  . Sepsis (HCC) 10/29/2019  . Acute on chronic systolic CHF (congestive heart failure) (HCC) 10/29/2019  . NSTEMI (non-ST elevated myocardial infarction) (HCC) 10/29/2019  . Acute on chronic diastolic CHF (congestive heart failure) (HCC) 05/26/2019  . PNA (pneumonia) 04/21/2017  . S/P cardiac catheterization 04/05/2017  . Bradycardia 03/26/2017  . Elevated troponin 03/26/2017  . AKI (acute kidney injury) (HCC) 03/26/2017  . SOB (shortness of breath) 04/15/2016  . CAD (coronary artery disease) 10/21/2015  . Hyperlipidemia 02/12/2015  . Paresthesia of foot, bilateral 02/12/2015  . Hyperlipidemia due to type 2 diabetes mellitus (HCC) 02/12/2015  . H/O adenomatous polyp of colon 01/08/2015  . Essential hypertension 10/14/2014  . Type 2 diabetes mellitus without complication (HCC) 10/14/2014  . 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 Atrial fibrillation (HCC) 06/07/2014  . Gout 06/07/2014    Orientation RESPIRATION BLADDER Height & Weight     Self, Time, Situation, Place  O2(2L) Continent Weight: 205 lb (93 kg) Height:  5\' 10"  (177.8 cm)  BEHAVIORAL SYMPTOMS/MOOD NEUROLOGICAL BOWEL NUTRITION STATUS   Continent Diet(Cardiac diet)  AMBULATORY STATUS COMMUNICATION OF NEEDS Skin   Limited Assist Verbally Surgical wounds                       Personal Care Assistance Level of Assistance  Bathing, Dressing, Feeding Bathing Assistance: Limited assistance Feeding assistance: Independent Dressing Assistance: Limited assistance     Functional Limitations Info  Sight, Hearing, Speech Sight Info: Adequate Hearing Info: Adequate Speech Info: Adequate    SPECIAL CARE FACTORS FREQUENCY  PT (By licensed PT), OT (By licensed OT)     PT Frequency: Minimum 5x a week OT Frequency: Minimum 5x a week            Contractures Contractures Info: Not present    Additional Factors Info  Code Status, Allergies, Insulin Sliding Scale Code Status Info: Full Code Allergies Info: Sitagliptin   Insulin Sliding Scale Info: insulin aspart (novoLOG) injection 0-9 Units 3x a day with meals       Current Medications (11/05/2019):  This is the current hospital active medication list Current Facility-Administered Medications  Medication Dose Route Frequency Provider Last Rate Last Dose  . 0.9 %  sodium chloride infusion   Intravenous PRN 14/05/2019, MD 10 mL/hr at 10/31/19 0132 250 mL at 10/31/19 0132  . acetaminophen (TYLENOL) tablet 650 mg  650 mg Oral Q6H PRN 14/02/20, MD   650 mg at 11/05/19 0416   Or  . acetaminophen (TYLENOL) suppository 650 mg  650 mg Rectal Q6H PRN 14/07/20 A, MD      . albuterol (PROVENTIL) (2.5 MG/3ML) 0.083% nebulizer solution 2.5 mg  2.5  mg Nebulization Q2H PRN Clance Boll, MD      . apixaban Arne Cleveland) tablet 5 mg  5 mg Oral BID Jennye Boroughs, MD   5 mg at 11/05/19 0904  . aspirin EC tablet 81 mg  81 mg Oral Daily Myles Rosenthal A, MD   81 mg at 11/05/19 0904  . atorvastatin (LIPITOR) tablet 80 mg  80 mg Oral Daily Myles Rosenthal A, MD   80 mg at 11/05/19 0904  . furosemide (LASIX) tablet 20 mg  20 mg Oral BID Lavina Hamman, MD    20 mg at 11/05/19 9767  . gabapentin (NEURONTIN) capsule 300 mg  300 mg Oral QHS Jennye Boroughs, MD   300 mg at 11/04/19 2113  . insulin aspart (novoLOG) injection 0-5 Units  0-5 Units Subcutaneous QHS Mansy, Arvella Merles, MD   3 Units at 11/04/19 2216  . insulin aspart (novoLOG) injection 0-9 Units  0-9 Units Subcutaneous TID WC Mansy, Arvella Merles, MD   3 Units at 11/05/19 1207  . ondansetron (ZOFRAN) tablet 4 mg  4 mg Oral Q6H PRN Clance Boll, MD       Or  . ondansetron [facility]) injection 4 mg  4 mg Intravenous Q6H PRN Clance Boll, MD   4 mg at 10/29/19 0544  . polyethylene glycol (MIRALAX / GLYCOLAX) packet 17 g  17 g Oral Daily Lavina Hamman, MD   17 g at 11/05/19 0905  . senna-docusate (Senokot-S) tablet 2 tablet  2 tablet Oral QHS Berle Mull M, MD      . sodium chloride flush (NS) 0.9 % injection 3 mL  3 mL Intravenous Q12H Corey Skains, MD   3 mL at 11/05/19 0906  . traMADol (ULTRAM) tablet 50 mg  50 mg Oral Q12H PRN Lavina Hamman, MD   50 mg at 11/05/19 3419     Discharge Medications: Please see discharge summary for a list of discharge medications.  Relevant Imaging Results:  Relevant Lab Results:   Additional Information SSN [id number]  Ross Ludwig, LCSW

## 2019-11-05 NOTE — Progress Notes (Signed)
[facility] Progress Note  Patient: Jeremiah Chapman ZOX:[id number]   PCP: Dorothey Baseman, MD DOB: 09/03/1946   DOA: 10/29/2019   DOS: 11/05/2019   Date of Service: the patient was seen and examined on 11/05/2019  Chief Complaint  Patient presents with  . Fall  . Hypoglycemia   Brief hospital course: DESHUN SEDIVY is an 73 y.o. male with medical history significant for COPD, atrial fibrillation, chronic systolic CHF with EF of 40%, type 2 diabetes mellitus, obesity (BMI 30.4), CKD stage III.  He was brought to the hospital because of altered mental status/lethargy.  Apparently, he was found on his kitchen floor by a neighbor.  Reportedly, his blood glucose level was 50 when EMS arrived at the scene.  In the emergency room, his blood glucose was 13 and he was hypothermic and bradycardic.  In the ED, he also had complaints of increasing shortness of breath.  Currently further plan is to await for safe discharge at [facility].  Subjective: Reports generalized body ache.  No nausea no vomiting no fever no chills.  Assessment and Plan: Scheduled Meds: . apixaban  5 mg Oral BID  . aspirin EC  81 mg Oral Daily  . atorvastatin  80 mg Oral Daily  . furosemide  20 mg Oral BID  . gabapentin  300 mg Oral QHS  . insulin aspart  0-5 Units Subcutaneous QHS  . insulin aspart  0-9 Units Subcutaneous TID WC  . polyethylene glycol  17 g Oral Daily  . senna-docusate  2 tablet Oral QHS  . sodium chloride flush  3 mL Intravenous Q12H   Continuous Infusions: . sodium chloride 250 mL (10/31/19 0132)   PRN Meds: sodium chloride, acetaminophen **OR** acetaminophen, albuterol, ondansetron **OR** ondansetron (ZOFRAN) IV, traMADol  Acute on chronic systolic CHF BNP was 1,511.   Patient was treated with IV Lasix. back to home Lasix 20 mg twice daily.  Acute non-ST elevation MI:  Continue aspirin and Lipitor.   Metoprolol on hold because of bradycardia.   Resume home dose of Eliquis.   S/p left  heart cath with report as listed below "Patent graft to ramus (obtuse marginal) Patent LIMA graft to LAD Moderate atherosclerosis of right coronary artery Patent stent of proximal left circumflex artery Patent native left sided PDA Severe stenosis of obtuse marginal 2 with complicated takeoff and stenoses with collaterals from grafted obtuse marginal and/or ramus which appears to be the culprit of non-ST elevation myocardial infarction  Plan No further cardiac intervention at this time due to collateral flow to obtuse marginal with infarct High intensity cholesterol therapy Avoid beta-blocker due to bradycardia and possible syncope Avoid ACE inhibitor due to severe chronic kidney disease stage iii Begin ambulation and cardiac rehabilitation and further evaluation of other possible syncopal episode issues including carotid atherosclerosis"   Right pleural effusion. Ultrasound paracentesis ordered on 11/01/2019 was canceled as the patient does not have significant amount of pleural fluid. Patient continues to remain short of breath although chest x-ray negative and normal pleural effusion that can be drained on ultrasound. Possibly abdominal etiology with constipation.    Likely constipation.  Constipation. Bowel regimen initiated.  Improving Continue to treat.  Reduce the strength.   Probable sepsis with probable pneumonia as the likely etiology Sepsis ruled out. Suspected his presenting clinical features were most likely from acute MI complicated by acute heart failure exacerbation.   Repeat chest x-ray shows no significant pneumonia and no significant CHF as well. So far no growth  on the blood cultures. Patient has already finished 7 days of IV antibiotics will discontinue antibiotics.  Type 2 diabetes mellitus uncontrolled with hypoglycemia with vascular and renal complication Glimepiride, Metformin and degludec have been held.  NovoLog as needed for hyperglycemia.   Hemoglobin  A1c 6.5 Pt actually had persistent hypoglycemia and needed d5 drip.   Metabolic encephalopathy: Resolved  CKD stage III b Creatinine is around his baseline.   Patient received IV fluids because he underwent left heart catheterization Monitor BMP. Avoid nephrotoxic medications  Chronic atrial fibrillation: Resume Eliquis.  Of note, patient said that he was taking Eliquis at home up until this admission.  COPD: Stable  We will provide incentive spirometry to treat patient's reported shortness of breath.  Examination does not reveal any acute abnormality nor the chest x-ray findings suggest any acute abnormality.  Arthralgia. Currently no identifiable etiology no inflammatory process identified in the joints.  Monitor.  Diet: Cardiac diet  DVT Prophylaxis: Therapeutic Anticoagulation with Eliquis   Advance goals of care discussion: Full code  Family Communication: no family was present at bedside, at the time of interview.   Disposition:  Discharge to SNF.    Consultants: Cardiology Procedures: Cardiac Catheterization, Echocardiogram   Antibiotics: Anti-infectives (From admission, onward)   Start     Dose/Rate Route Frequency Ordered Stop   11/02/19 1000  cefTRIAXone (ROCEPHIN) 2 g in sodium chloride 0.9 % 100 mL IVPB  Status:  Discontinued     2 g 200 mL/hr over 30 Minutes Intravenous Every 24 hours 11/01/19 1023 11/03/19 1255   10/31/19 1000  cefdinir (OMNICEF) capsule 300 mg  Status:  Discontinued     300 mg Oral Every 12 hours 10/31/19 0832 11/01/19 1019   10/31/19 1000  azithromycin (ZITHROMAX) tablet 500 mg     500 mg Oral Daily 10/31/19 0832 11/02/19 0948   10/30/19 1700  metroNIDAZOLE (FLAGYL) IVPB 500 mg  Status:  Discontinued     500 mg 100 mL/hr over 60 Minutes Intravenous Every 8 hours 10/30/19 1623 10/31/19 0827   10/30/19 1630  ceFEPIme (MAXIPIME) 2 g in sodium chloride 0.9 % 100 mL IVPB  Status:  Discontinued     2 g 200 mL/hr over 30 Minutes  Intravenous Every 12 hours 10/30/19 1623 10/31/19 0827   10/30/19 0748  vancomycin variable dose per unstable renal function (pharmacist dosing)  Status:  Discontinued      Does not apply See admin instructions 10/30/19 0749 10/30/19 1639   10/30/19 0600  vancomycin (VANCOCIN) IVPB 1000 mg/200 mL premix  Status:  Discontinued     1,000 mg 200 mL/hr over 60 Minutes Intravenous Every 24 hours 10/29/19 0302 10/30/19 0749   10/29/19 1000  ceFEPIme (MAXIPIME) 2 g in sodium chloride 0.9 % 100 mL IVPB  Status:  Discontinued     2 g 200 mL/hr over 30 Minutes Intravenous Every 8 hours 10/29/19 0253 10/29/19 0918   10/29/19 1000  ceFEPIme (MAXIPIME) 2 g in sodium chloride 0.9 % 100 mL IVPB  Status:  Discontinued     2 g 200 mL/hr over 30 Minutes Intravenous Every 12 hours 10/29/19 0918 10/30/19 1623   10/29/19 0330  vancomycin (VANCOCIN) IVPB 1000 mg/200 mL premix     1,000 mg 200 mL/hr over 60 Minutes Intravenous  Once 10/29/19 0246 10/29/19 1156   10/29/19 0300  ceFEPIme (MAXIPIME) 2 g in sodium chloride 0.9 % 100 mL IVPB  Status:  Discontinued     2 g 200 mL/hr over  30 Minutes Intravenous  Once 10/29/19 0246 10/29/19 0255   10/29/19 0300  metroNIDAZOLE (FLAGYL) IVPB 500 mg  Status:  Discontinued     500 mg 100 mL/hr over 60 Minutes Intravenous Every 8 hours 10/29/19 0246 10/30/19 1623   10/29/19 0115  ceFEPIme (MAXIPIME) 2 g in sodium chloride 0.9 % 100 mL IVPB     2 g 200 mL/hr over 30 Minutes Intravenous  Once 10/29/19 0104 10/29/19 0204   10/29/19 0115  metroNIDAZOLE (FLAGYL) IVPB 500 mg  Status:  Discontinued     500 mg 100 mL/hr over 60 Minutes Intravenous  Once 10/29/19 0104 10/29/19 0256   10/29/19 0115  vancomycin (VANCOCIN) IVPB 1000 mg/200 mL premix     1,000 mg 200 mL/hr over 60 Minutes Intravenous  Once 10/29/19 0104 10/29/19 0307       Objective: Physical Exam: Vitals:   11/04/19 1954 11/05/19 0438 11/05/19 0752 11/05/19 1601  BP: 128/78 139/71 126/69 139/63  Pulse: 72 80  63 67  Resp: 20 18 17 18   Temp: 97.8 F (36.6 C) 98 F (36.7 C) 98 F (36.7 C) 98 F (36.7 C)  TempSrc: Oral Oral Oral Oral  SpO2: 100% 100% 100% 99%  Weight:  93 kg    Height:        Intake/Output Summary (Last 24 hours) at 11/05/2019 1852 Last data filed at 11/05/2019 1700 Gross per 24 hour  Intake 120 ml  Output 3850 ml  Net -3730 ml   Filed Weights   11/02/19 0450 11/03/19 0426 11/05/19 0438  Weight: 97.8 kg 94 kg 93 kg   General: alert and oriented to time, place, and person. Appear in mild distress, affect appropriate Eyes: PERRL, Conjunctiva normal ENT: Oral Mucosa Clear, moist  Neck: no JVD, no Abnormal Mass Or lumps Cardiovascular: S1 and S2 Present, no Murmur, peripheral pulses symmetrical Respiratory: increased respiratory effort, Bilateral Air entry equal and Decreased, no signs of accessory muscle use, faint bilateral  Crackles, no wheezes Abdomen: Bowel Sound present, Soft and no tenderness, no hernia Skin: no rashes  Extremities: no Pedal edema, no calf tenderness Neurologic: without any new focal findings Gait not checked due to patient safety concerns   Data Reviewed: I have personally reviewed and interpreted daily labs, tele strips, imagings as discussed above. I reviewed all nursing notes, pharmacy notes, vitals, pertinent old records I have discussed plan of care as described above with RN and patient/family.  CBC: Recent Labs  Lab 10/31/19 0500 11/01/19 0504 11/02/19 0431 11/03/19 0640 11/04/19 0516 11/05/19 0446  WBC 10.9* 12.4* 14.7* 13.8* 11.5* 10.9*  NEUTROABS 8.4*  --   --   --   --   --   HGB 13.2 14.3 13.1 13.9 13.6 13.7  HCT 41.0 44.2 39.9 41.3 39.3 41.6  MCV 97.6 94.8 93.9 91.4 90.3 93.3  PLT 210 217 221 209 231 278   Basic Metabolic Panel: Recent Labs  Lab 11/01/19 0504 11/02/19 0431 11/03/19 0640 11/04/19 0516 11/05/19 0446  NA 139 133* 134* 135 136  K 4.5 4.1 4.0 3.8 3.9  CL 102 98 98 97* 96*  CO2 27 27 25 29 30    GLUCOSE 99 112* 193* 206* 196*  BUN 33* 33* 41* 41* 36*  CREATININE 1.60* 1.59* 1.57* 1.48* 1.37*  CALCIUM 8.9 8.3* 8.4* 8.4* 8.5*  MG 1.9  --   --   --   --     Liver Function Tests: No results for input(s): AST, ALT, ALKPHOS, BILITOT, PROT,  ALBUMIN in the last 168 hours. No results for input(s): LIPASE, AMYLASE in the last 168 hours. No results for input(s): AMMONIA in the last 168 hours. Coagulation Profile: No results for input(s): INR, PROTIME in the last 168 hours. Cardiac Enzymes: No results for input(s): CKTOTAL, CKMB, CKMBINDEX, TROPONINI in the last 168 hours. BNP (last 3 results) No results for input(s): PROBNP in the last 8760 hours. CBG: Recent Labs  Lab 11/04/19 1753 11/04/19 2144 11/05/19 0754 11/05/19 1107 11/05/19 1645  GLUCAP 232* 288* 157* 211* 245*   Studies: No results found.   Time spent: 35 minutes  Author: Lynden OxfordPranav Vana Arif, MD [facility] 11/05/2019 6:52 PM  To reach On-call, see care teams to locate the attending and reach out to them via [email or web address]. If 7PM-7AM, please contact night-coverage If you still have difficulty reaching the attending provider, please page the [facility] (Director on Call) for [facility] on amion for assistance.

## 2019-11-05 NOTE — Progress Notes (Signed)
Patient was medicated x 2 with Tylenol last night for C/O pain and stiffness, which he attributes to his fall. Minimal relief noted, but pt declined to receive additional pain meds at this time. Will CTM.  Upon assessment, right groin to have dressing in place from cath lab. Bandage removed and Band-Aid placed. Site is unremarkable.   Right arm and left hand are wrapped in Kerlix; right forearm Kerlix was removed and found to have Xeroform gauze in place, covered with non adherent gauze and wrapped with Kerlix. Pt states dressing has been in place for several days, xeroform was adherent to wound and difficult to remove, oozing noted from site.  Left hand dressing also removed and found to have Xeroform gauze, covered with non-adherent gauze, but was also adherent to wound. Saturated with NS for ease of removal of dressing, Xeroform reapplied, covered with nonadherent gauze and wrapped with Kerlix.   Left upper arm PIV was found to be swollen, ecchymotic, and infiltrated. IV removed, and small skin tear was created. Non-adherent gauze applied and wrapped with Coban, to avoid tape to skin.   Will place wound consult to address care needed for wounds/skin tears.

## 2019-11-05 NOTE — Care Management Important Message (Signed)
Important Message  Patient Details  Name: Jeremiah Chapman MRN: [id number] Date of Birth: 07/24/1946   Medicare Important Message Given:  Yes     Dannette Barbara 11/05/2019, 12:33 PM

## 2019-11-05 NOTE — Progress Notes (Signed)
Inpatient Diabetes Program Recommendations  AACE/ADA: New Consensus Statement on Inpatient Glycemic Control (2015)  Target Ranges:  Prepandial:   less than 140 mg/dL      Peak postprandial:   less than 180 mg/dL (1-2 hours)      Critically ill patients:  140 - 180 mg/dL   Results for Jeremiah Chapman, Jeremiah Chapman (MRN [id number]) as of 11/05/2019 14:22  Ref. Range 11/04/2019 07:49 11/04/2019 12:38 11/04/2019 17:53 11/04/2019 21:44  Glucose-Capillary Latest Ref Range: 70 - 99 mg/dL 195 (H)  2 units NOVOLOG  204 (H)  3 units NOVOLOG  232 (H)  3 units NOVOLOG  288 (H)  3 units NOVOLOG    Results for Jeremiah Chapman, Jeremiah Chapman (MRN [id number]) as of 11/05/2019 14:22  Ref. Range 11/05/2019 07:54 11/05/2019 11:07  Glucose-Capillary Latest Ref Range: 70 - 99 mg/dL 157 (H)  2 units NOVOLOG   211 (H)  3 units NOVOLOG      Home DM Meds: Tresiba 130 units Daily         Metformin 1000 mg BID        Amaryl 4 mg Daily   Current Orders: Novolog Sensitive Correction Scale/ SSI (0-9 units) TID AC + HS     ENDO: Dr. Cline Crock seen 09/20/2019--No changes made to OP regimen      MD- Note CBGs elevated after meals.  Please consider adding Novolog Meal Coverage: Novolog 4 units TID with meals  (Please add the following Hold Parameters: Hold if pt eats <50% of meal, Hold if pt NPO)     --Will follow patient during hospitalization--  Wyn Quaker RN, MSN, CDE Diabetes Coordinator Inpatient Glycemic Control Team Team Pager: [phone] (8a-5p)

## 2019-11-05 NOTE — TOC Initial Note (Addendum)
Transition of Care [facility]) - Initial/Assessment Note    Patient Details  Name: Jeremiah Chapman MRN: [id number] Date of Birth: [birth year]  Transition of Care [facility] CM/SW Contact:    Darleene Cleaver, LCSW Phone Number: 11/05/2019, 5:57 PM  Clinical Narrative:                  Patient is a 73 year old male who is alert and oriented x4.  Patient was explained PT recommendations, and role of CSW.  Patient was explained how insurance will pay for stay at SNF, and what to expect.  CSW informed patient about different options for SNFs locally.  Patient stated he would rather not go to SNF, but he understands it needs to be done.  CSW explained to him what to expect day of discharge and what process is to find SNF.  Patient is agreeable to going to SNF for short term rehab, he gave CSW permission to begin bed search in [place].  Expected Discharge Plan: Skilled Nursing Facility Barriers to Discharge: Continued Medical Work up   Patient Goals and CMS Choice Patient states their goals for this hospitalization and ongoing recovery are:: To go to SNF for short term rehab, then return back home. CMS Medicare.gov Compare Post Acute Care list provided to:: Patient Choice offered to / list presented to : Patient  Expected Discharge Plan and Services Expected Discharge Plan: Skilled Nursing Facility In-house Referral: NA Discharge Planning Services: NA Post Acute Care Choice: Skilled Nursing Facility Living arrangements for the past 2 months: Single Family Home                 DME Arranged: N/A DME Agency: NA       HH Arranged: NA HH Agency: NA        Prior Living Arrangements/Services Living arrangements for the past 2 months: Single Family Home Lives with:: Self Patient language and need for interpreter reviewed:: Yes Do you feel safe going back to the place where you live?: No   Patient feels he needs to go to SNF for short term rehab, before he is able to return back home.   Need for Family Participation in Patient Care: No (Comment) Care giver support system in place?: No (comment)   Criminal Activity/Legal Involvement Pertinent to Current Situation/Hospitalization: No - Comment as needed  Activities of Daily Living Home Assistive Devices/Equipment: Eyeglasses, Cane (specify quad or straight) ADL Screening (condition at time of admission) Patient's cognitive ability adequate to safely complete daily activities?: Yes Is the patient deaf or have difficulty hearing?: Yes Does the patient have difficulty seeing, even when wearing glasses/contacts?: No Does the patient have difficulty concentrating, remembering, or making decisions?: No Patient able to express need for assistance with ADLs?: Yes Does the patient have difficulty dressing or bathing?: No Independently performs ADLs?: Yes (appropriate for developmental age) Does the patient have difficulty walking or climbing stairs?: Yes Weakness of Legs: Both Weakness of Arms/Hands: Both  Permission Sought/Granted Permission sought to share information with : Facility Medical sales representative, Family Supports Permission granted to share information with : Yes, Verbal Permission Granted  Share Information with NAME: Aeneas, [place] Son [phone]  [phone]  Permission granted to share info w AGENCY: SNF admissions        Emotional Assessment Appearance:: Appears stated age Attitude/Demeanor/Rapport: Engaged Affect (typically observed): Calm, Appropriate, Accepting, Stable Orientation: : Oriented to Self, Oriented to Place, Oriented to  Time, Oriented to Situation Alcohol / Substance Use: Not Applicable Psych  Involvement: No (comment)  Admission diagnosis:  Hypoglycemia [E16.2] Elevated troponin [R77.8] Sepsis, due to unspecified organism, unspecified whether acute organ dysfunction present [facility]) [A41.9] NSTEMI (non-ST elevated myocardial infarction) [facility]) [I21.4] Patient Active Problem List   Diagnosis  Date Noted  . Hypoglycemia 10/29/2019  . Sepsis ([place]) 10/29/2019  . Acute on chronic systolic CHF (congestive heart failure) ([place]) 10/29/2019  . NSTEMI (non-ST elevated myocardial infarction) ([place]) 10/29/2019  . Acute on chronic diastolic CHF (congestive heart failure) ([place]) 05/26/2019  . PNA (pneumonia) 04/21/2017  . S/P cardiac catheterization 04/05/2017  . Bradycardia 03/26/2017  . Elevated troponin 03/26/2017  . AKI (acute kidney injury) ([place]) 03/26/2017  . SOB (shortness of breath) 04/15/2016  . CAD (coronary artery disease) 10/21/2015  . Hyperlipidemia 02/12/2015  . Paresthesia of foot, bilateral 02/12/2015  . Hyperlipidemia due to type 2 diabetes mellitus ([place]) 02/12/2015  . H/O adenomatous polyp of colon 01/08/2015  . Essential hypertension 10/14/2014  . Type 2 diabetes mellitus without complication ([place]) 63/84/6659  . 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 Atrial fibrillation ([place]) 06/07/2014  . Gout 06/07/2014   PCP:  Juluis Pitch, MD Pharmacy:   [facility] [place], [place] - [address] [place] [address] Phone: [phone] Fax: [phone]  [id number] [address], [address] [place] [address] Phone: [phone] Fax: [phone]     Social Determinants of Health (SDOH) Interventions    Readmission Risk Interventions No flowsheet data found.

## 2019-11-05 NOTE — Progress Notes (Signed)
Occupational Therapy Treatment Patient Details Name: Jeremiah Chapman MRN: [id number] DOB: Sep 22, 1946 Today's Date: 11/05/2019    History of present illness Pt is a 73 y.o. male presenting to hospital 10/29/19 s/p fall in living room.  Pt noted with hypoglycemia, elevated troponin, hypothermic, and bradycardic.  Pt admitted iwth acute exacerbation of chronic systolic CHF, acute NSTEMI, and probably sepsis with probably PNA.  Pt s/p cardiac cath 12/1.  PMH includes a-fib, CAD, CHF, DM, htn, bradycardia, B paresthesia of foot, CABG, cardioversion, L elbow and shoulder pain.   OT comments  Pt. was assisted with repositioning. Pt. education was provided about energy conservation/work simplification techniques with review of the visual handout provided to the pt. during a previous session. SO2 98% HR: 68 on 2LO2. Reviewed pursed lip breathing techniques with the pt.  Pt. was assisted with repositioning. Pt. continues to benefit from OT services for ADL training, A/E training, and pt. education about energy conservation/work simplification techniques, pursed lip breathing techniques, home modification, and DME. Pt. would benefit from SNF level of care upon discharge with follow-up OT services.   Follow Up Recommendations  SNF    Equipment Recommendations       Recommendations for Other Services      Precautions / Restrictions Precautions Precautions: Fall Restrictions Weight Bearing Restrictions: No       Mobility Bed Mobility Overal bed mobility: Needs Assistance Bed Mobility: Supine to Sit;Sit to Supine     Supine to sit: Mod assist;Max assist;HOB elevated Sit to supine: +2 for physical assistance      Transfers Overall transfer level: Needs assistance Equipment used: Rolling walker (2 wheeled) Transfers: Sit to/from Stand Sit to Stand: Total assist              Balance                                           ADL either performed or assessed with  clinical judgement   ADL Overall ADL's : Needs assistance/impaired Eating/Feeding: Set up;Independent;Bed level   Grooming: Set up;Independent;Bed level   Upper Body Bathing: Set up;Minimal assistance;Bed level   Lower Body Bathing: Set up;Moderate assistance;Bed level   Upper Body Dressing : Set up;Minimal assistance;Bed level   Lower Body Dressing: Set up;Moderate assistance;Bed level                 General ADL Comments: Pt. education was provided about energy conservation/work simplification techniques.     Vision Baseline Vision/History: No visual deficits     Perception     Praxis      Cognition Arousal/Alertness: Awake/alert Behavior During Therapy: WFL for tasks assessed/performed Overall Cognitive Status: Within Functional Limits for tasks assessed                                 General Comments: Pt. requires cues for encouragement.        Exercises     Shoulder Instructions       General Comments      Pertinent Vitals/ Pain       Pain Assessment: No/denies pain  Home Living  Prior Functioning/Environment              Frequency  Min 2X/week        Progress Toward Goals  OT Goals(current goals can now be found in the care plan section)        Plan Frequency remains appropriate;Discharge plan needs to be updated    Co-evaluation                 AM-PAC OT "6 Clicks" Daily Activity     Outcome Measure   Help from another person eating meals?: None Help from another person taking care of personal grooming?: None Help from another person toileting, which includes using toliet, bedpan, or urinal?: A Lot Help from another person bathing (including washing, rinsing, drying)?: A Lot Help from another person to put on and taking off regular upper body clothing?: A Little Help from another person to put on and taking off regular lower body clothing?: A  Lot 6 Click Score: 17    End of Session    OT Visit Diagnosis: Muscle weakness (generalized) (M62.81);Pain   Activity Tolerance Patient limited by fatigue   Patient Left in bed;with call bell/phone within reach;with bed alarm set;with nursing/sitter in room   Nurse Communication          Time: 1125-1140 OT Time Calculation (min): 15 min  Charges: OT General Charges $OT Visit: 1 Visit OT Treatments $Self Care/Home Management : 8-22 mins  Olegario Messier, MS, OTR/L  Olegario Messier 11/05/2019, 4:22 PM

## 2019-11-06 ENCOUNTER — Inpatient Hospital Stay: Payer: Medicare Other

## 2019-11-06 LAB — COMPREHENSIVE METABOLIC PANEL
ALT: 26 U/L (ref 0–44)
AST: 30 U/L (ref 15–41)
Albumin: 2.7 g/dL — ABNORMAL LOW (ref 3.5–5.0)
Alkaline Phosphatase: 94 U/L (ref 38–126)
Anion gap: 11 (ref 5–15)
BUN: 34 mg/dL — ABNORMAL HIGH (ref 8–23)
CO2: 28 mmol/L (ref 22–32)
Calcium: 8.7 mg/dL — ABNORMAL LOW (ref 8.9–10.3)
Chloride: 95 mmol/L — ABNORMAL LOW (ref 98–111)
Creatinine, Ser: 1.42 mg/dL — ABNORMAL HIGH (ref 0.61–1.24)
GFR calc Af Amer: 56 mL/min — ABNORMAL LOW (ref >60)
GFR calc non Af Amer: 48 mL/min — ABNORMAL LOW (ref >60)
Glucose, Bld: 206 mg/dL — ABNORMAL HIGH (ref 70–99)
Potassium: 4.1 mmol/L (ref 3.5–5.1)
Sodium: 134 mmol/L — ABNORMAL LOW (ref 135–145)
Total Bilirubin: 0.9 mg/dL (ref 0.3–1.2)
Total Protein: 6.8 g/dL (ref 6.5–8.1)

## 2019-11-06 LAB — CBC WITH DIFFERENTIAL/PLATELET
Abs Immature Granulocytes: 0.08 10*3/uL — ABNORMAL HIGH (ref 0.00–0.07)
Basophils Absolute: 0.1 10*3/uL (ref 0.0–0.1)
Basophils Relative: 1 %
Eosinophils Absolute: 0.5 10*3/uL (ref 0.0–0.5)
Eosinophils Relative: 4 %
HCT: 40.9 % (ref 39.0–52.0)
Hemoglobin: 13.7 g/dL (ref 13.0–17.0)
Immature Granulocytes: 1 %
Lymphocytes Relative: 11 %
Lymphs Abs: 1.3 10*3/uL (ref 0.7–4.0)
MCH: 30.7 pg (ref 26.0–34.0)
MCHC: 33.5 g/dL (ref 30.0–36.0)
MCV: 91.7 fL (ref 80.0–100.0)
Monocytes Absolute: 0.9 10*3/uL (ref 0.1–1.0)
Monocytes Relative: 7 %
Neutro Abs: 9.1 10*3/uL — ABNORMAL HIGH (ref 1.7–7.7)
Neutrophils Relative %: 76 %
Platelets: 308 10*3/uL (ref 150–400)
RBC: 4.46 MIL/uL (ref 4.22–5.81)
RDW: 13.3 % (ref 11.5–15.5)
WBC: 12 10*3/uL — ABNORMAL HIGH (ref 4.0–10.5)
nRBC: 0 % (ref 0.0–0.2)

## 2019-11-06 LAB — GLUCOSE, CAPILLARY
Glucose-Capillary: 180 mg/dL — ABNORMAL HIGH (ref 70–99)
Glucose-Capillary: 249 mg/dL — ABNORMAL HIGH (ref 70–99)

## 2019-11-06 LAB — CULTURE, BLOOD (ROUTINE X 2)
Culture: NO GROWTH
Culture: NO GROWTH
Special Requests: ADEQUATE
Special Requests: ADEQUATE

## 2019-11-06 LAB — MAGNESIUM: Magnesium: 1.9 mg/dL (ref 1.7–2.4)

## 2019-11-06 MED ORDER — POLYETHYLENE GLYCOL 3350 17 G PO PACK: 17.0000 g | PACK | Freq: Every day | ORAL | 0 refills | Status: AC

## 2019-11-06 MED ORDER — SENNOSIDES-DOCUSATE SODIUM 8.6-50 MG PO TABS: 2.0000 | ORAL_TABLET | Freq: Every day | ORAL | 0 refills | Status: AC

## 2019-11-06 MED ORDER — INSULIN DEGLUDEC 200 UNIT/ML ~~LOC~~ SOPN: 8.0000 [IU] | PEN_INJECTOR | Freq: Every day | SUBCUTANEOUS | 0 refills | Status: AC

## 2019-11-06 MED ORDER — TRAMADOL HCL 50 MG PO TABS: 50.0000 mg | ORAL_TABLET | Freq: Two times a day (BID) | ORAL | 0 refills | Status: AC | PRN

## 2019-11-06 NOTE — Plan of Care (Signed)
  Problem: Education: Goal: Knowledge of General Education information will improve Description: Including pain rating scale, medication(s)/side effects and non-pharmacologic comfort measures Outcome: Adequate for Discharge   Problem: Health Behavior/Discharge Planning: Goal: Ability to manage health-related needs will improve Outcome: Adequate for Discharge   Problem: Clinical Measurements: Goal: Ability to maintain clinical measurements within normal limits will improve Outcome: Adequate for Discharge Goal: Will remain free from infection Outcome: Adequate for Discharge Goal: Diagnostic test results will improve Outcome: Adequate for Discharge Goal: Respiratory complications will improve Outcome: Adequate for Discharge Goal: Cardiovascular complication will be avoided Outcome: Adequate for Discharge   Problem: Coping: Goal: Level of anxiety will decrease Outcome: Adequate for Discharge   Problem: Nutrition: Goal: Adequate nutrition will be maintained Outcome: Adequate for Discharge   Problem: Elimination: Goal: Will not experience complications related to bowel motility Outcome: Adequate for Discharge Goal: Will not experience complications related to urinary retention Outcome: Adequate for Discharge   Problem: Safety: Goal: Ability to remain free from injury will improve Outcome: Adequate for Discharge   Problem: Skin Integrity: Goal: Risk for impaired skin integrity will decrease Outcome: Adequate for Discharge

## 2019-11-06 NOTE — Progress Notes (Signed)
Physical Therapy Treatment Patient Details Name: Jeremiah Chapman MRN: [id number] DOB: 09/15/1946 Today's Date: 11/06/2019    History of Present Illness Pt is a 73 y.o. male presenting to hospital 10/29/19 s/p fall in living room.  Pt noted with hypoglycemia, elevated troponin, hypothermic, and bradycardic.  Pt admitted iwth acute exacerbation of chronic systolic CHF, acute NSTEMI, and probably sepsis with probably PNA.  Pt s/p cardiac cath 12/1.  PMH includes a-fib, CAD, CHF, DM, htn, bradycardia, B paresthesia of foot, CABG, cardioversion, L elbow and shoulder pain.    PT Comments    Pt resting in bed upon PT arrival and requesting to use BSC to have bowel movement.  Pt requiring mod to max assist x1 semi-supine to sitting edge of bed; min to mod assist x1 to stand up to walker; min assist x1 plus 2nd SBA to take steps with walker bed to [facility]; and CGA to min assist x2 to take steps from [facility] to recliner with walker use (pt requesting to sit in recliner instead of going back to bed).  Pt appearing with generalized weakness and requiring extra time to take steps and initiate mobility.  O2 sats 90% or greater on room air during session's activities (94% room air end of session).  Will continue to focus on strengthening and progressive functional mobility per pt tolerance.   Follow Up Recommendations  SNF     Equipment Recommendations  Rolling walker with 5" wheels;3in1 (PT);Wheelchair (measurements PT);Wheelchair cushion (measurements PT)    Recommendations for Other Services OT consult     Precautions / Restrictions Precautions Precautions: Fall Restrictions Weight Bearing Restrictions: No    Mobility  Bed Mobility Overal bed mobility: Needs Assistance Bed Mobility: Supine to Sit     Supine to sit: Mod assist;Max assist;HOB elevated     General bed mobility comments: min to mod assist for B LE's; assist for trunk; min to mod assist to scoot to edge of bed; vc's for technique;  increased effort and time to perform  Transfers Overall transfer level: Needs assistance Equipment used: Rolling walker (2 wheeled) Transfers: Sit to/from [employer] Sit to Stand: Min assist;Mod assist Stand pivot transfers: Min assist;+2 safety/equipment       General transfer comment: x2 trials standing from bed; x1 trial standing from [facility]; vc's for UE/LE placement and overall technique with transfers; stand step turn bed to [facility] with RW and min assist x1 plus SBA of 2nd for safety and to secure BSC during transfer  Ambulation/Gait Ambulation/Gait assistance: Min guard;Min assist;+2 physical assistance;+2 safety/equipment Gait Distance (Feet): 4 Feet Assistive device: Rolling walker (2 wheeled)   Gait velocity: decreased   General Gait Details: increased effort and time for pt to take steps with walker use; mildly flexed posture   Stairs             Wheelchair Mobility    Modified Rankin (Stroke Patients Only)       Balance Overall balance assessment: Needs assistance Sitting-balance support: No upper extremity supported;Feet supported Sitting balance-Leahy Scale: Good Sitting balance - Comments: steady sitting reaching within BOS   Standing balance support: Bilateral upper extremity supported Standing balance-Leahy Scale: Poor Standing balance comment: heavy UE support through RW to maintain standing                            Cognition Arousal/Alertness: Awake/alert Behavior During Therapy: Flat affect Overall Cognitive Status: Within Functional Limits for tasks assessed  Exercises      General Comments General comments (skin integrity, edema, etc.): bandages L hand and R upper forearm.  Nursing cleared pt for participation in physical therapy.  Pt agreeable to PT session.      Pertinent Vitals/Pain Pain Assessment: No/denies pain(pt reports B knee soreness with  activity but 0/10 at rest) Pain Intervention(s): Limited activity within patient's tolerance;Monitored during session;Repositioned  HR WFL during session's activities.    Home Living                      Prior Function            PT Goals (current goals can now be found in the care plan section) Acute Rehab PT Goals Patient Stated Goal: to improve mobility PT Goal Formulation: With patient Time For Goal Achievement: 11/16/19 Potential to Achieve Goals: Good Progress towards PT goals: Progressing toward goals    Frequency    Min 2X/week      PT Plan Current plan remains appropriate    Co-evaluation              AM-PAC PT "6 Clicks" Mobility   Outcome Measure  Help needed turning from your back to your side while in a flat bed without using bedrails?: A Lot Help needed moving from lying on your back to sitting on the side of a flat bed without using bedrails?: A Lot Help needed moving to and from a bed to a chair (including a wheelchair)?: A Lot Help needed standing up from a chair using your arms (e.g., wheelchair or bedside chair)?: A Lot Help needed to walk in hospital room?: Total Help needed climbing 3-5 steps with a railing? : Total 6 Click Score: 10    End of Session Equipment Utilized During Treatment: Gait belt Activity Tolerance: Patient limited by fatigue Patient left: in chair;with call bell/phone within reach;with chair alarm set;Other (comment)(B heels floating via pillow) Nurse Communication: Mobility status;Precautions PT Visit Diagnosis: Other abnormalities of gait and mobility (R26.89);Muscle weakness (generalized) (M62.81);History of falling (Z91.81);Difficulty in walking, not elsewhere classified (R26.2);Pain Pain - Right/Left: Right(Left) Pain - part of body: Knee     Time: 1122-1200 PT Time Calculation (min) (ACUTE ONLY): 38 min  Charges:  $Therapeutic Activity: 38-52 mins                     Leitha Bleak, PT 11/06/19,  1:09 PM

## 2019-11-06 NOTE — Progress Notes (Signed)
Occupational Therapy Treatment Patient Details Name: Jeremiah Chapman MRN: [id number] DOB: [birth year] Today's Date: 11/06/2019    History of present illness Pt is a 73 y.o. male presenting to hospital 10/29/19 s/p fall in living room.  Pt noted with hypoglycemia, elevated troponin, hypothermic, and bradycardic.  Pt admitted iwth acute exacerbation of chronic systolic CHF, acute NSTEMI, and probably sepsis with probably PNA.  Pt s/p cardiac cath 12/1.  PMH includes a-fib, CAD, CHF, DM, htn, bradycardia, B paresthesia of foot, CABG, cardioversion, L elbow and shoulder pain.   OT comments  Jeremiah Chapman was seen for OT treatment on this date. Upon arrival to room pt awake, seated on bedside commode on room air. Pt requesting assistance with toilet transfer this date. OT assists pt with transfer and peri-care while providing reinforcement of prior education in energy conservation strategies. See ADL section below for additional details. Pt return verbalizes/demonstrates understanding of education provided. He requires +1 min A for functional transfer from [facility] to room recliner. RN notifed that pt SpO2 dropped to 80% during mobility, but rebounded quickly to 94% with rest break and cues for PLB. Pt left in recliner, on RA, with SpO2 of 93% at last reading. Pt making good progress toward goals and continues to benefit from skilled OT services to maximize return to PLOF and minimize risk of future falls, injury, caregiver burden, and readmission. Will continue to follow POC as written. Discharge recommendation remains appropriate.    Follow Up Recommendations  SNF    Equipment Recommendations  (TBD at next venue of care.)    Recommendations for Other Services      Precautions / Restrictions Precautions Precautions: Fall Restrictions Weight Bearing Restrictions: No       Mobility Bed Mobility Overal bed mobility: Needs Assistance Bed Mobility: Supine to Sit     Supine to sit: Mod assist;Max  assist;HOB elevated     General bed mobility comments: Deferred. Pt on BSC and requesting to return to room recliner during session.  Transfers Overall transfer level: Needs assistance Equipment used: Rolling walker (2 wheeled) Transfers: Sit to/from [employer] Sit to Stand: Min assist;Min guard Stand pivot transfers: Min assist       General transfer comment: Pt stands from [facility] given Min a and moderate cueing for safety and technique this date. Takes ~5 small steps to turn toward room recliner from [facility] with CGA-Min A throughout. Pt requires increased time/effort to perfrom mobility tasks.    Balance Overall balance assessment: Needs assistance Sitting-balance support: No upper extremity supported;Feet supported Sitting balance-Jeremiah Chapman: Good Sitting balance - Comments: steady sitting reaching within BOS   Standing balance support: Bilateral upper extremity supported Standing balance-Jeremiah Chapman: Poor Standing balance comment: heavy UE support through RW to maintain standing; requires assist to maintain balance during weight shift for peri-care this date.                           ADL either performed or assessed with clinical judgement   ADL Overall ADL's : Needs assistance/impaired Eating/Feeding: Set up;Sitting;Independent Eating/Feeding Details (indicate cue type and reason): Pt engages in self-feeding while seated upright in room recliner this date. Good use of BUE for mgt of tray items. Pt reporting decreased apetite, and stating "that food tastes awful", but agreeable to eating sherbert cup this date. Grooming: Sitting;Set up;Independent   Upper Body Bathing: Set up;Minimal assistance;Sitting;Supervision/ safety   Lower Body Bathing: Sitting/lateral leans;Moderate assistance;Set up;Supervison/ safety  Upper Body Dressing : Sitting;Supervision/safety;Set up   Lower Body Dressing: Moderate assistance;Sit to/from stand   Toilet Transfer:  RW;Moderate assistance;BSC;Ambulation StatisticianToilet Transfer Details (indicate cue type and reason): Upon arrival to pt room, pt seated on BSC. Pt stated he was finished with BSC and would like to bet back to chair. OT assists pt with functional transfer. Pt req. mod cueing for safety and sequencing this date as well as Min A for STS. Toileting- Clothing Manipulation and Hygiene: Moderate assistance;Cueing for safety;With adaptive equipment;Sit to/from stand Toileting - Clothing Manipulation Details (indicate cue type and reason): [facility] to wipe himself with significant increase and effort with OT providing min physical assist for stability. Pt requires moderate assist to complete wiping as he fatigues quickly.       General ADL Comments: OT provides continued education in implementation of energy conservation strategies for improved safety and functional independence during ADL tasks.     Vision Baseline Vision/History: No visual deficits Patient Visual Report: No change from baseline     Perception     Praxis      Cognition Arousal/Alertness: Awake/alert Behavior During Therapy: Flat affect Overall Cognitive Status: Within Functional Limits for tasks assessed                                 General Comments: Pt continues to require cueing for safety and encouragement. Has a generally flat affect t/o OT session but return demonstrates understanding of education provided during functional tasks.        Exercises Other Exercises Other Exercises: OT assists pt with toilet transfer and peri-care this date. OT provided education in energy conservation strategies t/o treatment session. See ADL section for additional detail.   Shoulder Instructions       General Comments Pt recieved on RA. Pt observed to desat to 80% with mobility this date, but SpO2 rebounds quickly to 94 with rest break and cues for PLB. RN aware and requesting pt remain on RA.     Pertinent Vitals/ Pain       Pain Assessment: No/denies pain(pt reports B knee soreness with activity but 0/10 at rest) Pain Score: 10-Worst pain ever Pain Location: Pt reports pain as "11/10" in BLE this date. Pain Descriptors / Indicators: Constant;Sore;Aching Pain Intervention(s): Limited activity within patient's tolerance;Monitored during session;Repositioned;Premedicated before session  Home Living                                          Prior Functioning/Environment              Frequency  Min 2X/week        Progress Toward Goals  OT Goals(current goals can now be found in the care plan section)  Progress towards OT goals: Progressing toward goals  Acute Rehab OT Goals Patient Stated Goal: to improve mobility OT Goal Formulation: With patient Potential to Achieve Goals: Good  Plan Frequency remains appropriate;Discharge plan remains appropriate    Co-evaluation                 AM-PAC OT "6 Clicks" Daily Activity     Outcome Measure   Help from another person eating meals?: None Help from another person taking care of personal grooming?: A Little Help from another person toileting, which includes using toliet,  bedpan, or urinal?: A Lot Help from another person bathing (including washing, rinsing, drying)?: A Lot Help from another person to put on and taking off regular upper body clothing?: A Little Help from another person to put on and taking off regular lower body clothing?: A Lot 6 Click Score: 16    End of Session Equipment Utilized During Treatment: Gait belt;Rolling walker  OT Visit Diagnosis: Muscle weakness (generalized) (M62.81);Pain Pain - Right/Left: Left(Both) Pain - part of body: Knee   Activity Tolerance Patient limited by fatigue   Patient Left with call bell/phone within reach;in chair;with chair alarm set   Nurse Communication Other (comment)(Pt SpO2 during session.)        Time: 1457-1516 OT Time  Calculation (min): 19 min  Charges: OT General Charges $OT Visit: 1 Visit OT Treatments $Self Care/Home Management : 8-22 mins  Shara Blazing, M.S., OTR/L Ascom: [phone] 11/06/19, 3:43 PM

## 2019-11-06 NOTE — Progress Notes (Signed)
Inpatient Diabetes Program Recommendations  AACE/ADA: New Consensus Statement on Inpatient Glycemic Control (2015)  Target Ranges:  Prepandial:   less than 140 mg/dL      [facility] postprandial:   less than 180 mg/dL (1-2 hours)      Critically ill patients:  140 - 180 mg/dL   Results for Jeremiah Chapman, Jeremiah Chapman (MRN [id number]) as of 11/06/2019 11:20  Ref. Range 11/05/2019 07:54 11/05/2019 11:07 11/05/2019 16:45 11/05/2019 20:47 11/05/2019 22:20  Glucose-Capillary Latest Ref Range: 70 - 99 mg/dL 157 (H)  2 units NOVOLOG  211 (H)  3 units NOVOLOG  245 (H)  3 units NOVOLOG  251 (H) 220 (H)  2 units NOVOLOG     Home DM Meds: Tresiba 130 units Daily                                    Metformin 1000 mg BID                              Amaryl 4 mg Daily   Current Orders: Novolog Sensitive Correction Scale/ SSI (0-9 units) TID AC + HS     ENDO: Dr. Cline Crock seen 09/20/2019--No changes made to OP regimen      MD- Note CBGs elevated after meals.  Please consider adding Novolog Meal Coverage: Novolog 4 units TID with meals  (Please add the following Hold Parameters: Hold if pt eats <50% of meal, Hold if pt NPO)      --Will follow patient during hospitalization--  Wyn Quaker RN, MSN, CDE Diabetes Coordinator Inpatient Glycemic Control Team Team Pager: [phone] (8a-5p)

## 2019-11-06 NOTE — TOC Progression Note (Signed)
Transition of Care [facility]) - Progression Note    Patient Details  Name: LYNDAL REGGIO MRN: [id number] Date of Birth: 1946/08/07  Transition of Care [facility]) CM/SW Contact  Ross Ludwig, [place] Phone Number: 11/06/2019, 12:54 PM  Clinical Narrative:    CSW spoke with patient and provided bed offers, patient chose [facility].  [facility] and [facility] can [facility] patient once he is medically ready for discharge.  CSW offered to call his family, patient stated he will updated them.  Patient informed CSW that he is on the wait list for [facility], CSW updated [facility] with patient's plan.   Expected Discharge Plan: [place] Barriers to Discharge: Continued Medical Work up  Expected Discharge Plan and Services Expected Discharge Plan: [place] In-house Referral: NA Discharge Planning Services: NA Post Acute Care Choice: [place] Living arrangements for the past 2 months: Single Family Home                 DME Arranged: N/A DME Agency: NA       HH Arranged: NA HH Agency: NA         Social Determinants of Health (SDOH) Interventions    Readmission Risk Interventions No flowsheet data found.

## 2019-11-06 NOTE — Plan of Care (Signed)

## 2019-11-06 NOTE — TOC Transition Note (Signed)
Transition of Care [facility]) - CM/SW Discharge Note   Patient Details  Name: Jeremiah Chapman MRN: [id number] Date of Birth: September 06, 1946  Transition of Care [facility]) CM/SW Contact:  Ross Ludwig, LCSW Phone Number: 11/06/2019, 3:03 PM   Clinical Narrative:     CSW spoke to [facility] [place] and they can accept patient today.  CSW updated physician and bedside nurse.  Patient stated he will update his son via phone.  Patient will be going to room 712.  Nurse to call report to [phone].   Final next level of care: Skilled Nursing Facility Barriers to Discharge: Barriers Resolved   Patient Goals and CMS Choice Patient states their goals for this hospitalization and ongoing recovery are:: To get rehab then return back home or go to a ALF CMS Medicare.gov Compare Post Acute Care list provided to:: Patient Choice offered to / list presented to : NA  Discharge Placement PASRR number recieved: 11/04/19            Patient chooses bed at: [facility] [place] Patient to be transferred to facility by: [facility] Name of family member notified: Patient to notify his son Patient and family notified of of transfer: 11/06/19  Discharge Plan and Services In-house Referral: NA Discharge Planning Services: NA Post Acute Care Choice: [place]          DME Arranged: N/A DME Agency: NA       HH Arranged: NA HH Agency: NA        Social Determinants of Health (SDOH) Interventions     Readmission Risk Interventions No flowsheet data found.

## 2019-11-06 NOTE — Discharge Summary (Addendum)
[facility] Discharge Summary   Patient: Jeremiah Chapman ZOX:[id number]   PCP: Dorothey Baseman, MD DOB: [birth year]   Date of admission: 10/29/2019   Date of discharge:  11/06/2019    Discharge Diagnoses:  Principal Problem:   Hypoglycemia Active Problems:   Sepsis (HCC)   Acute on chronic systolic CHF (congestive heart failure) (HCC)   NSTEMI (non-ST elevated myocardial infarction) (HCC)   Admitted From: home Disposition:  SNF   Recommendations for Outpatient Follow-up:  1. PCP: please follow up on blood sugar control 2. Follow up LABS/TEST:  none  Follow-up Information    [facility] Follow up.   Specialty: Cardiac Rehabilitation Why: Your Cardiologist has referred you to outpatient Cardiac Rehab at [facility].  Physical Therapy has recommended going to Skilled Nursing Facility for short term rehab.  Cardiac Rehab dept will check with you in 3 to 4 weeks to learn of your progress.   Contact information: [address] [id number] [place] [address] [phone]       Dorothey Baseman, MD. Schedule an appointment as soon as possible for a visit in 1 week(s).   Specialty: Family Medicine Contact information: [address] [place] [address] [phone]        Lamar Blinks, MD. Schedule an appointment as soon as possible for a visit in 1 month(s).   Specialty: Cardiology Contact information: [address] [facility] [place] [address] [phone]          Diet recommendation: Cardiac diet and Carb modified diet  Activity: The patient is advised to gradually reintroduce usual activities,as tolerated  Discharge Condition: good  Code Status: Full code   History of present illness: As per the H and P dictated on admission, "Jeremiah Chapman is a 73 y.o. male with medical history significant of  COPD obesity and questionable history of sleep apnea atrial fibrillation CHF  with EF of 40% who presents to the ED status post being found down.  Per EMS patient was found by his neighbor on his kitchen floor at the arrival of EMS patient was alert and sitting up however was noted to be confused and somewhat lethargic.  At that time patient's was evaluated his glucose was 50 there was no access at that time and patient was brought into the ED Without further treatment of his hyperglycemia.  Of note per EMS his vitals otherwise were noted to be stable.  Per patient stated that on awaking this morning he was in his  normal  state of health.  He states prior to his fall he had no prodrome no chest pain no palpitations no warning of presyncope prior to him falling.  Patient also denied fever,  Chills, nausea vomiting abdominal pain or recent URI symptoms. However he notes that over the last 2-3 weeks he has had  difficulty with increased shortness of breath.  States he has been followed by his pulmonary doctor in reference to this and was recently restarted on his Lasix for which he has been on for around a week and a half. he states however his breathing is at baseline from around 3 weeks ago.He also endorses chronic cough which has not changed in character and is not productive. "  Hospital Course:  Summary of his active problems in the hospital is as following.  Acute on chronic systolic CHF BNP was 1,511.  Patient was treated with IV Lasix. back to home Lasix 20 mg twice daily.  Acute non-ST elevation MI:  Continue aspirin and Lipitor Metoprolol on hold because of bradycardia.  Resume home dose of Eliquis.  S/p left heart cath with report as listed below "Patent graft to ramus (obtuse marginal) Patent LIMA graft to LAD Moderate atherosclerosis of right coronary artery Patent stent of proximal left circumflex artery Patent native left sided PDA Severe stenosis of obtuse marginal 2 with complicated takeoff and stenoses with collaterals from grafted obtuse marginal and/or  ramus which appears to be the culprit of non-ST elevation myocardial infarction  Plan No further cardiac intervention at this time due to collateral flow to obtuse marginal with infarct High intensity cholesterol therapy Avoid beta-blocker due to bradycardia and possible syncope Avoid ACE inhibitor due to severe chronic kidney disease stage iii Begin ambulation and cardiac rehabilitation and further evaluation of other possible syncopal episode issues including carotid atherosclerosis"  Right pleural effusion. Ultrasound paracentesis ordered on 11/01/2019 was canceled as the patient does not have significant amount of pleural fluid. Patient continues to remain short of breath although chest x-ray negative and normal pleural effusion that can be drained on ultrasound. Possibly abdominal etiology with constipation.   Likely constipation.  Constipation. Bowel regimen initiated.  Improving Continue to treat.  Reduce the strength.   Sepsis ruled out. Suspected on admission  his presenting clinical features were most likely from acute MI complicated by acute heart failure exacerbation.  Repeat chest x-ray shows no significant pneumonia and no significant CHF as well. So far no growth on the blood cultures. Patient has already finished 7 days of IV antibiotics  No further treatment or work up discontinue antibiotics.  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 Hemoglobin A1c 6.5 Pt actually had persistent hypoglycemia and needed d5 drip.   Metabolic encephalopathy: Resolved  CKD stage III b Creatinine is around his baseline.  Patient received IV fluids because he underwent left heart catheterization Monitor BMP. Avoid nephrotoxic medications  Chronic atrial fibrillation: Resume Eliquis. Of note, patient said that he was taking Eliquis  at home up until this admission.  COPD: Stable  We will provide incentive spirometry to treat patient's reported shortness of breath. Examination does not reveal any acute abnormality nor the chest x-ray findings suggest any acute abnormality.  Arthralgia. Currently no identifiable etiology no inflammatory process identified in the joints.  Monitor.  Pain control  - [employer] Controlled Substance Reporting System database was reviewed. - 5 day supply was provided. - Patient was instructed, not to drive, operate heavy machinery, perform activities at heights, swimming or participation in water activities or provide baby sitting services while on Pain, Sleep and Anxiety Medications; until his outpatient Physician has advised to do so again.  - Also recommended to not to take more than prescribed Pain, Sleep and Anxiety Medications.  Patient was seen by physical therapy, who recommended SNF, which was arranged. On the day of the discharge the patient's vitals were stable, and no other acute medical condition were reported by patient. the patient was felt safe to be discharge at SNF with SNF.  Consultants: Cardiology  Procedures: Echocardiogram  Cardiac Catheterization   DISCHARGE MEDICATION: Allergies as of 11/06/2019      Reactions   Sitagliptin Rash, Other (See Comments)   Januvia      Medication List    STOP taking these medications   digoxin 0.125 MG tablet Commonly known as: LANOXIN  irbesartan 300 MG tablet Commonly known as: AVAPRO   metoprolol succinate 25 MG 24 hr tablet Commonly known as: TOPROL-XL     TAKE these medications   apixaban 5 MG Tabs tablet Commonly known as: ELIQUIS Take 5 mg by mouth every 12 (twelve) hours.   aspirin 81 MG EC tablet Take 81 mg by mouth daily.   atorvastatin 80 MG tablet Commonly known as: LIPITOR Take 80 mg by mouth daily.   B-D ULTRAFINE III SHORT PEN 31G X 8 MM Misc Generic drug: Insulin Pen Needle 2 (two) times  daily.   FREESTYLE LITE test strip Generic drug: glucose blood USE TWICE DAILY AS DIRECTED   furosemide 20 MG tablet Commonly known as: LASIX Take 1 tablet (20 mg total) by mouth 2 (two) times daily.   gabapentin 100 MG capsule Commonly known as: NEURONTIN Take 100-300 mg by mouth See admin instructions. Take 1 capsule (100mg ) by mouth at dinnertime and take 3 capsules (300mg ) by mouth at bedtime   glimepiride 4 MG tablet Commonly known as: AMARYL Take 4 mg by mouth daily with breakfast.   Insulin Degludec 200 UNIT/ML Sopn Inject 8 Units into the skin daily. What changed: how much to take   metFORMIN 500 MG 24 hr tablet Commonly known as: GLUCOPHAGE-XR Take 1,000 mg by mouth 2 (two) times daily.   polyethylene glycol 17 g packet Commonly known as: MIRALAX / GLYCOLAX Take 17 g by mouth daily. Start taking on: November 07, 2019   senna-docusate 8.6-50 MG tablet Commonly known as: Senokot-S Take 2 tablets by mouth at bedtime.   tamsulosin 0.4 MG Caps capsule Commonly known as: FLOMAX Take 0.4 mg by mouth daily. Take after the same meal each day.   traMADol 50 MG tablet Commonly known as: ULTRAM Take 1 tablet (50 mg total) by mouth every 12 (twelve) hours as needed for up to 5 days for moderate pain or severe pain.   triamcinolone cream 0.1 % Commonly known as: KENALOG Apply 1 application topically 2 (two) times daily as needed (skin irritations).      Allergies  Allergen Reactions   Sitagliptin Rash and Other (See Comments)    Januvia   Discharge Instructions    AMB Referral to Cardiac Rehabilitation - Phase II   Complete by: As directed    Diagnosis: NSTEMI   Diet - low sodium heart healthy   Complete by: As directed    Discharge instructions   Complete by: As directed    It is important that you read the given instructions as well as go over your medication list with RN to help you understand your care after this hospitalization.  Please follow-up with  PCP in 1-2 weeks.  Please note that NO REFILLS for any discharge medications will be authorized once you are discharged, as it is imperative that you return to your primary care physician (or establish a relationship with a primary care physician if you do not have one) for your aftercare needs so that they can reassess your need for medications and monitor your lab values.  Please request your primary care physician to go over all Hospital Tests and Procedure/Radiological results at the follow up. Please get all Hospital records sent to your PCP by signing hospital release before you go home.    Do not take more than prescribed Pain, Sleep and Anxiety Medications.  You were cared for by a hospitalist during your hospital stay. If you have any questions about your discharge medications or  the care you received while you were in the hospital after you are discharged, you can call the unit @UNIT @ you were admitted to and ask to speak with the hospitalist Lynden OxfordPranav Vasilis Luhman. Ask for Hospitalist on call if the hospitalist that took care of you is not available.   Once you are discharged, your primary care physician will handle any further medical issues.  You Must read complete instructions/literature along with all the possible adverse reactions/side effects for all the Medicines you take and that have been prescribed to you. Take any new Medicines after you have completely understood and accept all the possible adverse reactions/side effects.  If you have smoked or chewed Tobacco in the last 2 yrs please STOP smoking STOP any Recreational drug use.  If you drink alcohol, please safely STOP the use. Do not drive, operating heavy machinery, perform activities at heights, swimming or participation in water activities or provide baby sitting services under influence.  Wear Seat belts while driving.   Increase activity slowly   Complete by: As directed      Discharge Exam: Filed Weights   11/03/19 0426  11/05/19 0438 11/06/19 0551  Weight: 94 kg 93 kg 94.3 kg   Vitals:   11/06/19 0551 11/06/19 0802  BP: 130/68 120/73  Pulse: 73 68  Resp: 18 18  Temp: 98.5 F (36.9 C) 98.3 F (36.8 C)  SpO2: 100% 93%   General: Appear in mild distress, no Rash; Oral Mucosa Clear, moist. no Abnormal Mass Or lumps Cardiovascular: S1 and S2 Present, no Murmur, Respiratory: normal respiratory effort, Bilateral Air entry present and Clear to Auscultation, no Crackles, no wheezes Abdomen: Bowel Sound present, Soft and no tenderness, no hernia Extremities: no Pedal edema, no calf tenderness Neurology: alert and oriented to time, place, and person affect appropriate.  The results of significant diagnostics from this hospitalization (including imaging, microbiology, ancillary and laboratory) are listed below for reference.    Significant Diagnostic Studies: Dg Chest 1 View  Result Date: 10/31/2019 CLINICAL DATA:  Shortness of breath EXAM: CHEST  1 VIEW COMPARISON:  October 30, 2019 FINDINGS: There is persistent consolidation in the lateral right base with small right pleural effusion. The lungs elsewhere are clear. There is cardiomegaly with pulmonary vascularity normal. No adenopathy. Patient is status post median sternotomy. The two superior most sternal wires are fractured, unchanged. No adenopathy. No bone lesions. IMPRESSION: Lateral right base airspace consolidation with small right pleural effusion. Lungs elsewhere clear. Stable cardiomegaly. Stable postoperative changes. Electronically Signed   By: Bretta BangWilliam  Woodruff III M.D.   On: 10/31/2019 07:26   Ct Head Wo Contrast  Result Date: 10/29/2019 CLINICAL DATA:  Fall EXAM: CT HEAD WITHOUT CONTRAST TECHNIQUE: Contiguous axial images were obtained from the base of the skull through the vertex without intravenous contrast. COMPARISON:  None. FINDINGS: Brain: No evidence of acute territorial infarction, hemorrhage, hydrocephalus,extra-axial collection or mass  lesion/mass effect. There is dilatation the ventricles and sulci consistent with age-related atrophy. Low-attenuation changes in the deep white matter consistent with small vessel ischemia. 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 Skull: The skull is intact. No fracture or focal lesion identified. Sinuses/Orbits: The visualized paranasal sinuses and mastoid air cells are clear. The orbits and globes intact. Other: None IMPRESSION: No acute intracranial abnormality. 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 Electronically Signed   By: Jonna ClarkBindu  Avutu M.D.   On:  10/29/2019 01:29   Ct Chest Wo Contrast  Result Date: 10/29/2019 CLINICAL DATA:  Shortness of breath EXAM: CT CHEST WITHOUT CONTRAST TECHNIQUE: Multidetector CT imaging of the chest was performed following the standard protocol without IV contrast. COMPARISON:  None. FINDINGS: Cardiovascular: The heart is enlarged. No pericardial effusion. Surgical changes from coronary artery bypass surgery are noted. Extensive three-vessel coronary artery calcifications. Moderate aortic calcifications but no aneurysm. Mediastinum/Nodes: Small scattered mediastinal and hilar lymph nodes but no mass or overt adenopathy. Lungs/Pleura: There is a moderate-size right pleural effusion with overlying atelectasis. There is also loculated pleural fluid anteriorly with overlying right middle lobe atelectasis which has the appearance of rounded atelectasis. There is also a small left pleural effusion with minimal overlying atelectasis. I do not see any findings for pulmonary edema or worrisome pulmonary nodules. Emphysematous changes are noted. Upper Abdomen: No significant upper abdominal findings. Musculoskeletal: No chest wall mass, supraclavicular or axillary adenopathy. The thyroid gland is grossly normal. The bony  structures are intact. IMPRESSION: 1. Moderate-sized right pleural effusion with overlying atelectasis. Anterior loculated component is noted with overlying probable rounded atelectasis. I would recommend a follow-up chest CT in 3 months to make sure this resolves or is at least stable. 2. No definite pulmonary mass or worrisome pulmonary nodules. 3. Small left effusion with minimal overlying atelectasis. 4. Cardiac enlargement. 5. 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 Aortic Atherosclerosis (ICD10-I70.0) and Emphysema (ICD10-J43.9). Electronically Signed   By: Rudie Meyer M.D.   On: 10/29/2019 07:23   [place] Chest (pleural Effusion)  Result Date: 11/01/2019 CLINICAL DATA:  Pleural effusion. EXAM: CHEST ULTRASOUND COMPARISON:  Chest x-ray 10/31/2019. FINDINGS: Very small right pleural effusion noted. No left pleural effusion identified. No large enough effusion noted for safe thoracentesis. IMPRESSION: No large enough pleural effusion noted for safe thoracentesis. Electronically Signed   By: Maisie Fus  Register   On: 11/01/2019 12:37   Dg Chest Port 1 View  Result Date: 10/30/2019 CLINICAL DATA:  Sepsis, former smoker, CHF, coronary artery disease, atrial fibrillation EXAM: PORTABLE CHEST 1 VIEW COMPARISON:  Portable exam 1617 hours compared to 10/29/2019 FINDINGS: Enlargement of cardiac silhouette post median sternotomy. Cranial 2 sternotomy wires are fractured. Mediastinal contours and pulmonary vascularity normal. Bibasilar atelectasis versus infiltrate and small RIGHT pleural effusion. Upper lungs clear. No pneumothorax or acute osseous findings. IMPRESSION: Enlargement of cardiac silhouette. Bibasilar atelectasis versus infiltrate and small RIGHT pleural effusion. Little interval change. Electronically Signed   By: Ulyses Southward M.D.   On: 10/30/2019 17:39   Dg Chest Portable 1 View  Result Date: 10/29/2019 CLINICAL DATA:  Hypoglycemia  EXAM: PORTABLE CHEST 1 VIEW COMPARISON:  May 26, 2019 FINDINGS: The cardiomediastinal silhouette is unchanged from prior exam. Overlying median sternotomy wires. There is a small right pleural effusion, as on the prior exam. There is mild prominence of the central pulmonary vasculature. No acute osseous abnormality. IMPRESSION: Small right pleural effusion as on prior exam. Findings suggestive of mild pulmonary vascular congestion. Electronically Signed   By: Jonna Clark M.D.   On: 10/29/2019 00:37   Dg Abd Portable 1v  Result Date: 11/01/2019 CLINICAL DATA:  Constipation. EXAM: PORTABLE ABDOMEN - 1 VIEW COMPARISON:  CT 10/29/2019. FINDINGS: Penile prosthesis noted soft tissue structures are unremarkable. Aortoiliac and visceral atherosclerotic vascular calcification no bowel distention. Moderate stool volume. No free air. Right base atelectasis and pleural effusion again noted. Degenerative change lumbar spine and both hips. IMPRESSION: 1.  Moderate stool  volume.  No bowel distention. 2. Right base atelectasis and right-sided pleural effusion again noted. Electronically Signed   By: Maisie Fus  Register   On: 11/01/2019 11:12    Microbiology: Recent Results (from the past 240 hour(s))  Blood Culture (routine x 2)     Status: None   Collection Time: 10/29/19  1:06 AM   Specimen: BLOOD  Result Value Ref Range Status   Specimen Description BLOOD RIGHT ARM  Final   Special Requests   Final    BOTTLES DRAWN AEROBIC AND ANAEROBIC Blood Culture adequate volume   Culture   Final    NO GROWTH 5 DAYS Performed at [facility], [address] [place], [place] [address]    Report Status 11/03/2019 FINAL  Final  Blood Culture (routine x 2)     Status: None   Collection Time: 10/29/19  1:06 AM   Specimen: BLOOD  Result Value Ref Range Status   Specimen Description BLOOD LEFT ARM  Final   Special Requests   Final    BOTTLES DRAWN AEROBIC AND ANAEROBIC Blood Culture adequate volume   Culture    Final    NO GROWTH 5 DAYS Performed at [facility], [address], [place], [place] [address]    Report Status 11/03/2019 FINAL  Final  SARS CORONAVIRUS 2 (TAT 6-24 HRS) Nasopharyngeal Nasopharyngeal Swab     Status: None   Collection Time: 10/29/19  1:36 AM   Specimen: Nasopharyngeal Swab  Result Value Ref Range Status   SARS Coronavirus 2 NEGATIVE NEGATIVE Final    Comment: (NOTE) SARS-CoV-2 target nucleic acids are NOT DETECTED. The SARS-CoV-2 RNA is generally detectable in upper and lower respiratory specimens during the acute phase of infection. Negative results do not preclude SARS-CoV-2 infection, do not rule out co-infections with other pathogens, and should not be used as the sole basis for treatment or other patient management decisions. Negative results must be combined with clinical observations, patient history, and epidemiological information. The expected result is Negative. Fact Sheet for Patients: HairSlick.no Fact Sheet for Healthcare Providers: quierodirigir.com This test is not yet approved or cleared by the [place] FDA and  has been authorized for detection and/or diagnosis of SARS-CoV-2 by FDA under an Emergency Use Authorization (EUA). This EUA will remain  in effect (meaning this test can be used) for the duration of the COVID-19 declaration under Section 56 4(b)(1) of the Act, 21 U.S.C. section 360bbb-3(b)(1), unless the authorization is terminated or revoked sooner. Performed at [facility], [address] [place], [place] [address]   Urine culture     Status: None   Collection Time: 10/29/19  7:04 AM   Specimen: In/Out Cath Urine  Result Value Ref Range Status   Specimen Description   Final    IN/OUT CATH URINE Performed at [facility], [address], [place], [place] [address]    Special Requests   Final    NONE Performed at [facility], [address] [place], [place] [address]    Culture   Final    NO GROWTH Performed at [facility], [address], [place], [place] [address]    Report Status 10/30/2019 FINAL  Final  Culture, blood (x 2)     Status: None   Collection Time: 11/01/19 10:43 AM   Specimen: BLOOD  Result Value Ref Range Status   Specimen Description BLOOD RIGHT [facility]  Final   Special Requests   Final  BOTTLES DRAWN AEROBIC AND ANAEROBIC Blood Culture adequate volume   Culture   Final    NO GROWTH 5 DAYS Performed at [facility], [address] [place], [place] [address]    Report Status 11/06/2019 FINAL  Final  Culture, blood (x 2)     Status: None   Collection Time: 11/01/19 12:00 PM   Specimen: BLOOD  Result Value Ref Range Status   Specimen Description BLOOD RIGHT ANTECUBITAL  Final   Special Requests   Final    BOTTLES DRAWN AEROBIC AND ANAEROBIC Blood Culture adequate volume   Culture   Final    NO GROWTH 5 DAYS Performed at [facility], [address], [place], [place] [address]    Report Status 11/06/2019 FINAL  Final  Culture, sputum-assessment     Status: None   Collection Time: 11/01/19  5:38 PM   Specimen: Expectorated Sputum  Result Value Ref Range Status   Specimen Description EXPECTORATED SPUTUM  Final   Special Requests NONE  Final   Sputum evaluation   Final    THIS SPECIMEN IS ACCEPTABLE FOR SPUTUM CULTURE Performed at [facility], [address], [place], [place] [address]    Report Status 11/02/2019 FINAL  Final  Culture, respiratory     Status: None   Collection Time: 11/01/19  5:38 PM  Result Value Ref Range Status   Specimen Description   Final    EXPECTORATED SPUTUM Performed at [facility], [address], [place], [place] [address]    Special Requests   Final    NONE Reflexed from [id number] Performed at [facility], [address] [place], [place] [address]    Gram Stain   Final    ABUNDANT WBC  PRESENT,BOTH PMN AND MONONUCLEAR FEW GRAM POSITIVE COCCI    Culture   Final    Consistent with normal respiratory flora. Performed at [facility], [place] [address], [place], [place] [address]    Report Status 11/04/2019 FINAL  Final  SARS CORONAVIRUS 2 (TAT 6-24 HRS) Nasopharyngeal Nasopharyngeal Swab     Status: None   Collection Time: 11/05/19  9:45 AM   Specimen: Nasopharyngeal Swab  Result Value Ref Range Status   SARS Coronavirus 2 NEGATIVE NEGATIVE Final    Comment: (NOTE) SARS-CoV-2 target nucleic acids are NOT DETECTED. The SARS-CoV-2 RNA is generally detectable in upper and lower respiratory specimens during the acute phase of infection. Negative results do not preclude SARS-CoV-2 infection, do not rule out co-infections with other pathogens, and should not be used as the sole basis for treatment or other patient management decisions. Negative results must be combined with clinical observations, patient history, and epidemiological information. The expected result is Negative. Fact Sheet for Patients: SugarRoll.be Fact Sheet for Healthcare Providers: https://www.woods-mathews.com/ This test is not yet approved or cleared by the [place] FDA and  has been authorized for detection and/or diagnosis of SARS-CoV-2 by FDA under an Emergency Use Authorization (EUA). This EUA will remain  in effect (meaning this test can be used) for the duration of the COVID-19 declaration under Section 56 4(b)(1) of the Act, 21 U.S.C. section 360bbb-3(b)(1), unless the authorization is terminated or revoked sooner. Performed at [facility], [place] [address], [place], [place] [address]      Labs: CBC: Recent Labs  Lab 10/31/19 0500  11/02/19 0431 11/03/19 0640 11/04/19 0516 11/05/19 0446 11/06/19 0443  WBC 10.9*   < > 14.7* 13.8* 11.5* 10.9* 12.0*  NEUTROABS 8.4*  --   --   --   --   --  9.1*  HGB 13.2   < > 13.1 13.9 13.6  13.7 13.7  HCT 41.0   < > 39.9 41.3 39.3 41.6 40.9  MCV 97.6   < > 93.9 91.4 90.3 93.3 91.7  PLT 210   < > 221 209 231 278 308   < > = values in this interval not displayed.   Basic Metabolic Panel: Recent Labs  Lab 11/01/19 0504 11/02/19 0431 11/03/19 0640 11/04/19 0516 11/05/19 0446 11/06/19 0443  NA 139 133* 134* 135 136 134*  K 4.5 4.1 4.0 3.8 3.9 4.1  CL 102 98 98 97* 96* 95*  CO2 GLUCOSE 99 112* 193* 206* 196* 206*  BUN 33* 33* 41* 41* 36* 34*  CREATININE 1.60* 1.59* 1.57* 1.48* 1.37* 1.42*  CALCIUM 8.9 8.3* 8.4* 8.4* 8.5* 8.7*  MG 1.9  --   --   --   --  1.9   Liver Function Tests: Recent Labs  Lab 11/06/19 0443  AST 30  ALT 26  ALKPHOS 94  BILITOT 0.9  PROT 6.8  ALBUMIN 2.7*   No results for input(s): LIPASE, AMYLASE in the last 168 hours. No results for input(s): AMMONIA in the last 168 hours. Cardiac Enzymes: No results for input(s): CKTOTAL, CKMB, CKMBINDEX, TROPONINI in the last 168 hours. BNP (last 3 results) Recent Labs    05/26/19 1051 10/29/19 0301 10/31/19 0500  BNP 966.0* 1,511.0* 733.0*   CBG: Recent Labs  Lab 11/05/19 1645 11/05/19 2047 11/05/19 2220 11/06/19 0803 11/06/19 1152  GLUCAP 245* 251* 220* 180* 249*    Time spent: 35 minutes  Signed:  Lynden Oxford  [facility]  11/06/2019 1:20 PM

## 2019-11-06 NOTE — Progress Notes (Signed)
[facility] called for report, EMS called for transport

## 2020-01-14 ENCOUNTER — Ambulatory Visit: Payer: Medicare Other | Admitting: Podiatry

## 2020-01-14 ENCOUNTER — Other Ambulatory Visit: Payer: Self-pay

## 2020-01-14 ENCOUNTER — Ambulatory Visit (INDEPENDENT_AMBULATORY_CARE_PROVIDER_SITE_OTHER): Payer: Medicare Other | Admitting: Podiatry

## 2020-01-14 ENCOUNTER — Encounter: Payer: Self-pay | Admitting: Podiatry

## 2020-01-14 DIAGNOSIS — M79609 Pain in unspecified limb: Secondary | ICD-10-CM

## 2020-01-14 DIAGNOSIS — B351 Tinea unguium: Secondary | ICD-10-CM

## 2020-01-14 DIAGNOSIS — M2010 Hallux valgus (acquired), unspecified foot: Secondary | ICD-10-CM

## 2020-01-14 DIAGNOSIS — D689 Coagulation defect, unspecified: Secondary | ICD-10-CM

## 2020-01-14 DIAGNOSIS — E1142 Type 2 diabetes mellitus with diabetic polyneuropathy: Secondary | ICD-10-CM

## 2020-01-14 NOTE — Progress Notes (Signed)
Complaint:  Visit Type: Patient returns to my office for continued preventative foot care services. Complaint: Patient states" my nails have grown long and thick and become painful to walk and wear shoes" Patient has been diagnosed with DM with no foot complications. The patient presents for preventative foot care services. No changes to ROS.  Patient on eliquiss..  Podiatric Exam: Vascular: dorsalis pedis and posterior tibial pulses are palpable bilateral. Capillary return is immediate. Temperature gradient is WNL. Skin turgor WNL  Sensorium: Diminished  Semmes Weinstein monofilament test. Normal tactile sensation bilaterally. Nail Exam: Pt has thick disfigured discolored nails with subungual debris noted bilateral entire nail hallux through fifth toenails Ulcer Exam: There is no evidence of ulcer or pre-ulcerative changes or infection. Orthopedic Exam: Muscle tone and strength are WNL. No limitations in general ROM. No crepitus or effusions noted. Foot type and digits show no abnormalities. HAV   B/L Skin: No Porokeratosis. No infection or ulcers.    Diagnosis:  Onychomycosis, , Pain in right toe, pain in left toes  Treatment & Plan Procedures and Treatment: Consent by patient was obtained for treatment procedures. The patient understood the discussion of treatment and procedures well. All questions were answered thoroughly reviewed. Debridement of mycotic and hypertrophic toenails, 1 through 5 bilateral and clearing of subungual debris. No ulceration, no infection noted.  Return Visit-Office Procedure: Patient instructed to return to the office for a follow up visit 3 months for continued evaluation and treatment.    Helane Gunther DPM

## 2020-04-04 ENCOUNTER — Other Ambulatory Visit
Admission: RE | Admit: 2020-04-04 | Discharge: 2020-04-04 | Disposition: A | Discharge status: Home / Self Care | Payer: Medicare Other | Source: Ambulatory Visit | Attending: Pulmonary Disease | Admitting: Pulmonary Disease

## 2020-04-04 ENCOUNTER — Other Ambulatory Visit: Payer: Self-pay | Admitting: Specialist

## 2020-04-04 DIAGNOSIS — Z01812 Encounter for preprocedural laboratory examination: Secondary | ICD-10-CM | POA: Diagnosis present

## 2020-04-04 DIAGNOSIS — Z20822 Contact with and (suspected) exposure to covid-19: Secondary | ICD-10-CM | POA: Insufficient documentation

## 2020-04-04 DIAGNOSIS — J90 Pleural effusion, not elsewhere classified: Secondary | ICD-10-CM

## 2020-04-04 LAB — SARS CORONAVIRUS 2 (TAT 6-24 HRS): SARS Coronavirus 2: NEGATIVE

## 2020-04-07 ENCOUNTER — Ambulatory Visit
Admission: RE | Admit: 2020-04-07 | Discharge: 2020-04-07 | Disposition: A | Discharge status: Home / Self Care | Payer: Medicare Other | Source: Ambulatory Visit | Attending: Interventional Radiology | Admitting: Interventional Radiology

## 2020-04-07 ENCOUNTER — Other Ambulatory Visit: Payer: Self-pay

## 2020-04-07 ENCOUNTER — Ambulatory Visit
Admission: RE | Admit: 2020-04-07 | Discharge: 2020-04-07 | Disposition: A | Discharge status: Home / Self Care | Payer: Medicare Other | Source: Ambulatory Visit | Attending: Specialist | Admitting: Specialist

## 2020-04-07 DIAGNOSIS — J90 Pleural effusion, not elsewhere classified: Secondary | ICD-10-CM | POA: Insufficient documentation

## 2020-04-07 DIAGNOSIS — Z9889 Other specified postprocedural states: Secondary | ICD-10-CM

## 2020-04-07 DIAGNOSIS — R918 Other nonspecific abnormal finding of lung field: Secondary | ICD-10-CM | POA: Insufficient documentation

## 2020-04-07 LAB — LACTATE DEHYDROGENASE, PLEURAL OR PERITONEAL FLUID: LD, Fluid: 49 U/L — ABNORMAL HIGH (ref 3–23)

## 2020-04-07 LAB — BODY FLUID CELL COUNT WITH DIFFERENTIAL
Eos, Fluid: 0 %
Lymphs, Fluid: 70 %
Monocyte-Macrophage-Serous Fluid: 22 %
Neutrophil Count, Fluid: 8 %
Total Nucleated Cell Count, Fluid: 436 cu mm

## 2020-04-07 LAB — GLUCOSE, PLEURAL OR PERITONEAL FLUID: Glucose, Fluid: 274 mg/dL

## 2020-04-07 LAB — PROTEIN, PLEURAL OR PERITONEAL FLUID: Total protein, fluid: <3 g/dL

## 2020-04-07 LAB — AMYLASE, PLEURAL OR PERITONEAL FLUID: Amylase, Fluid: 48 U/L

## 2020-04-08 LAB — PROTEIN, BODY FLUID (OTHER): Total Protein, Body Fluid Other: 1.8 g/dL

## 2020-04-08 LAB — ACID FAST SMEAR (AFB, MYCOBACTERIA): Acid Fast Smear: NEGATIVE

## 2020-04-09 LAB — CYTOLOGY - NON PAP

## 2020-04-10 LAB — BODY FLUID CULTURE: Culture: NO GROWTH

## 2020-04-10 LAB — CHOLESTEROL, BODY FLUID: Cholesterol, Fluid: 15 mg/dL

## 2020-04-14 ENCOUNTER — Ambulatory Visit (INDEPENDENT_AMBULATORY_CARE_PROVIDER_SITE_OTHER): Payer: Medicare Other | Admitting: Podiatry

## 2020-04-14 ENCOUNTER — Other Ambulatory Visit: Payer: Self-pay

## 2020-04-14 ENCOUNTER — Encounter: Payer: Self-pay | Admitting: Podiatry

## 2020-04-14 DIAGNOSIS — E1142 Type 2 diabetes mellitus with diabetic polyneuropathy: Secondary | ICD-10-CM

## 2020-04-14 DIAGNOSIS — M2010 Hallux valgus (acquired), unspecified foot: Secondary | ICD-10-CM

## 2020-04-14 DIAGNOSIS — D689 Coagulation defect, unspecified: Secondary | ICD-10-CM

## 2020-04-14 DIAGNOSIS — M79609 Pain in unspecified limb: Secondary | ICD-10-CM

## 2020-04-14 DIAGNOSIS — B351 Tinea unguium: Secondary | ICD-10-CM

## 2020-04-14 NOTE — Progress Notes (Signed)
This patient returns to my office for at risk foot care.  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  Patient is taking eliquiss.  This patient is unable to cut nails himself since the patient cannot reach his nails.These nails are painful walking and wearing shoes.  This patient presents for at risk foot care today.  General Appearance  Alert, conversant and in no acute stress.  Vascular  Dorsalis pedis and posterior tibial  pulses are palpable  bilaterally.  Capillary return is within normal limits  bilaterally. Temperature is within normal limits  bilaterally.  Neurologic  Senn-Weinstein monofilament wire test within normal limits  bilaterally. Muscle power within normal limits bilaterally.  Nails Thick disfigured discolored nails with subungual debris  from hallux to fifth toes bilaterally. No evidence of bacterial infection or drainage bilaterally.  Orthopedic  No limitations of motion  feet .  No crepitus or effusions noted.  No bony pathology or digital deformities noted.  HAV  B/L.  Skin  normotropic skin with no porokeratosis noted bilaterally.  No signs of infections or ulcers noted.     Onychomycosis  Pain in right toes  Pain in left toes  Consent was obtained for treatment procedures.   Mechanical debridement of nails 1-5  bilaterally performed with a nail nipper.  Filed with dremel without incident.    Return office visit    3 months                 Told patient to return for periodic foot care and evaluation due to potential at risk complications.   Tori Cupps DPM  

## 2020-05-07 LAB — FUNGUS CULTURE WITH STAIN

## 2020-05-07 LAB — FUNGAL ORGANISM REFLEX

## 2020-05-07 LAB — FUNGUS CULTURE RESULT

## 2020-05-23 LAB — ACID FAST CULTURE WITH REFLEXED SENSITIVITIES (MYCOBACTERIA): Acid Fast Culture: NEGATIVE

## 2020-06-01 ENCOUNTER — Inpatient Hospital Stay
Admission: EM | Admit: 2020-06-01 | Discharge: 2020-06-03 | DRG: 291 | Disposition: A | Discharge status: Home Health | Payer: Medicare Other | Attending: Internal Medicine | Admitting: Internal Medicine

## 2020-06-01 ENCOUNTER — Emergency Department: Payer: Medicare Other

## 2020-06-01 ENCOUNTER — Inpatient Hospital Stay: Payer: Medicare Other

## 2020-06-01 ENCOUNTER — Other Ambulatory Visit: Payer: Self-pay

## 2020-06-01 DIAGNOSIS — I248 Other forms of acute ischemic heart disease: Secondary | ICD-10-CM | POA: Diagnosis present

## 2020-06-01 DIAGNOSIS — I10 Essential (primary) hypertension: Secondary | ICD-10-CM | POA: Diagnosis not present

## 2020-06-01 DIAGNOSIS — T502X5A Adverse effect of carbonic-anhydrase inhibitors, benzothiadiazides and other diuretics, initial encounter: Secondary | ICD-10-CM | POA: Diagnosis present

## 2020-06-01 DIAGNOSIS — E876 Hypokalemia: Secondary | ICD-10-CM | POA: Diagnosis present

## 2020-06-01 DIAGNOSIS — I4821 Permanent atrial fibrillation: Secondary | ICD-10-CM | POA: Diagnosis present

## 2020-06-01 DIAGNOSIS — Z7982 Long term (current) use of aspirin: Secondary | ICD-10-CM

## 2020-06-01 DIAGNOSIS — Z7901 Long term (current) use of anticoagulants: Secondary | ICD-10-CM | POA: Diagnosis not present

## 2020-06-01 DIAGNOSIS — Z87891 Personal history of nicotine dependence: Secondary | ICD-10-CM | POA: Diagnosis not present

## 2020-06-01 DIAGNOSIS — I4891 Unspecified atrial fibrillation: Secondary | ICD-10-CM

## 2020-06-01 DIAGNOSIS — Z79899 Other long term (current) drug therapy: Secondary | ICD-10-CM | POA: Diagnosis not present

## 2020-06-01 DIAGNOSIS — E1122 Type 2 diabetes mellitus with diabetic chronic kidney disease: Secondary | ICD-10-CM | POA: Diagnosis present

## 2020-06-01 DIAGNOSIS — I509 Heart failure, unspecified: Secondary | ICD-10-CM

## 2020-06-01 DIAGNOSIS — R778 Other specified abnormalities of plasma proteins: Secondary | ICD-10-CM

## 2020-06-01 DIAGNOSIS — J90 Pleural effusion, not elsewhere classified: Secondary | ICD-10-CM | POA: Diagnosis present

## 2020-06-01 DIAGNOSIS — I130 Hypertensive heart and chronic kidney disease with heart failure and stage 1 through stage 4 chronic kidney disease, or unspecified chronic kidney disease: Secondary | ICD-10-CM | POA: Diagnosis present

## 2020-06-01 DIAGNOSIS — N183 Chronic kidney disease, stage 3 unspecified: Secondary | ICD-10-CM | POA: Diagnosis present

## 2020-06-01 DIAGNOSIS — N1831 Chronic kidney disease, stage 3a: Secondary | ICD-10-CM | POA: Diagnosis present

## 2020-06-01 DIAGNOSIS — Z794 Long term (current) use of insulin: Secondary | ICD-10-CM | POA: Diagnosis not present

## 2020-06-01 DIAGNOSIS — E1129 Type 2 diabetes mellitus with other diabetic kidney complication: Secondary | ICD-10-CM | POA: Diagnosis present

## 2020-06-01 DIAGNOSIS — M25512 Pain in left shoulder: Secondary | ICD-10-CM

## 2020-06-01 DIAGNOSIS — E785 Hyperlipidemia, unspecified: Secondary | ICD-10-CM

## 2020-06-01 DIAGNOSIS — I2510 Atherosclerotic heart disease of native coronary artery without angina pectoris: Secondary | ICD-10-CM | POA: Diagnosis present

## 2020-06-01 DIAGNOSIS — Z951 Presence of aortocoronary bypass graft: Secondary | ICD-10-CM

## 2020-06-01 DIAGNOSIS — Z955 Presence of coronary angioplasty implant and graft: Secondary | ICD-10-CM

## 2020-06-01 DIAGNOSIS — E7800 Pure hypercholesterolemia, unspecified: Secondary | ICD-10-CM | POA: Diagnosis present

## 2020-06-01 DIAGNOSIS — Z20822 Contact with and (suspected) exposure to covid-19: Secondary | ICD-10-CM | POA: Diagnosis present

## 2020-06-01 DIAGNOSIS — Z9889 Other specified postprocedural states: Secondary | ICD-10-CM

## 2020-06-01 DIAGNOSIS — I5023 Acute on chronic systolic (congestive) heart failure: Secondary | ICD-10-CM

## 2020-06-01 DIAGNOSIS — J9601 Acute respiratory failure with hypoxia: Secondary | ICD-10-CM

## 2020-06-01 LAB — CBC WITH DIFFERENTIAL/PLATELET
Abs Immature Granulocytes: 0.03 10*3/uL (ref 0.00–0.07)
Basophils Absolute: 0.1 10*3/uL (ref 0.0–0.1)
Basophils Relative: 1 %
Eosinophils Absolute: 0.1 10*3/uL (ref 0.0–0.5)
Eosinophils Relative: 1 %
HCT: 36 % — ABNORMAL LOW (ref 39.0–52.0)
Hemoglobin: 11.7 g/dL — ABNORMAL LOW (ref 13.0–17.0)
Immature Granulocytes: 0 %
Lymphocytes Relative: 11 %
Lymphs Abs: 0.8 10*3/uL (ref 0.7–4.0)
MCH: 30.8 pg (ref 26.0–34.0)
MCHC: 32.5 g/dL (ref 30.0–36.0)
MCV: 94.7 fL (ref 80.0–100.0)
Monocytes Absolute: 0.4 10*3/uL (ref 0.1–1.0)
Monocytes Relative: 6 %
Neutro Abs: 5.9 10*3/uL (ref 1.7–7.7)
Neutrophils Relative %: 81 %
Platelets: 208 10*3/uL (ref 150–400)
RBC: 3.8 MIL/uL — ABNORMAL LOW (ref 4.22–5.81)
RDW: 15.9 % — ABNORMAL HIGH (ref 11.5–15.5)
WBC: 7.3 10*3/uL (ref 4.0–10.5)
nRBC: 0 % (ref 0.0–0.2)

## 2020-06-01 LAB — COMPREHENSIVE METABOLIC PANEL
ALT: 10 U/L (ref 0–44)
AST: 18 U/L (ref 15–41)
Albumin: 3.5 g/dL (ref 3.5–5.0)
Alkaline Phosphatase: 82 U/L (ref 38–126)
Anion gap: 14 (ref 5–15)
BUN: 25 mg/dL — ABNORMAL HIGH (ref 8–23)
CO2: 28 mmol/L (ref 22–32)
Calcium: 8.9 mg/dL (ref 8.9–10.3)
Chloride: 100 mmol/L (ref 98–111)
Creatinine, Ser: 1.31 mg/dL — ABNORMAL HIGH (ref 0.61–1.24)
GFR calc Af Amer: >60 mL/min (ref >60)
GFR calc non Af Amer: 53 mL/min — ABNORMAL LOW (ref >60)
Glucose, Bld: 113 mg/dL — ABNORMAL HIGH (ref 70–99)
Potassium: 4.2 mmol/L (ref 3.5–5.1)
Sodium: 142 mmol/L (ref 135–145)
Total Bilirubin: 2.3 mg/dL — ABNORMAL HIGH (ref 0.3–1.2)
Total Protein: 7.4 g/dL (ref 6.5–8.1)

## 2020-06-01 LAB — SARS CORONAVIRUS 2 BY RT PCR (HOSPITAL ORDER, PERFORMED IN ~~LOC~~ HOSPITAL LAB): SARS Coronavirus 2: NEGATIVE

## 2020-06-01 LAB — BRAIN NATRIURETIC PEPTIDE: B Natriuretic Peptide: 1909 pg/mL — ABNORMAL HIGH (ref 0.0–100.0)

## 2020-06-01 LAB — PROTIME-INR
INR: 1.1 (ref 0.8–1.2)
Prothrombin Time: 14.2 seconds (ref 11.4–15.2)

## 2020-06-01 LAB — GLUCOSE, CAPILLARY
Glucose-Capillary: 122 mg/dL — ABNORMAL HIGH (ref 70–99)
Glucose-Capillary: 163 mg/dL — ABNORMAL HIGH (ref 70–99)

## 2020-06-01 LAB — BILIRUBIN, FRACTIONATED(TOT/DIR/INDIR)
Bilirubin, Direct: 0.6 mg/dL — ABNORMAL HIGH (ref 0.0–0.2)
Indirect Bilirubin: 1.5 mg/dL — ABNORMAL HIGH (ref 0.3–0.9)
Total Bilirubin: 2.1 mg/dL — ABNORMAL HIGH (ref 0.3–1.2)

## 2020-06-01 LAB — MAGNESIUM: Magnesium: 1.7 mg/dL (ref 1.7–2.4)

## 2020-06-01 LAB — HEMOGLOBIN A1C
Hgb A1c MFr Bld: 8 % — ABNORMAL HIGH (ref 4.8–5.6)
Mean Plasma Glucose: 182.9 mg/dL

## 2020-06-01 LAB — TROPONIN I (HIGH SENSITIVITY)
Troponin I (High Sensitivity): 56 ng/L — ABNORMAL HIGH (ref <18)
Troponin I (High Sensitivity): 57 ng/L — ABNORMAL HIGH (ref <18)

## 2020-06-01 LAB — PROCALCITONIN: Procalcitonin: <0.1 ng/mL

## 2020-06-01 MED ORDER — METHOCARBAMOL 1000 MG/10ML IJ SOLN
500.0000 mg | Freq: Three times a day (TID) | INTRAVENOUS | Status: DC | PRN
  Administered 2020-06-02 (×2): 500 mg via INTRAVENOUS
  Filled 2020-06-01 (×3): qty 5

## 2020-06-01 MED ORDER — SODIUM CHLORIDE 0.9% FLUSH: 3.0000 mL | INTRAVENOUS | Status: DC | PRN

## 2020-06-01 MED ORDER — INSULIN GLARGINE 100 UNIT/ML ~~LOC~~ SOLN
5.0000 [IU] | Freq: Every day | SUBCUTANEOUS | Status: DC
  Administered 2020-06-01 – 2020-06-02 (×2): 5 [IU] via SUBCUTANEOUS
  Filled 2020-06-01 (×3): qty 0.05

## 2020-06-01 MED ORDER — SODIUM CHLORIDE 0.9% FLUSH
3.0000 mL | Freq: Two times a day (BID) | INTRAVENOUS | Status: DC
  Administered 2020-06-01 – 2020-06-03 (×4): 3 mL via INTRAVENOUS

## 2020-06-01 MED ORDER — IRBESARTAN 150 MG PO TABS
300.0000 mg | ORAL_TABLET | Freq: Every day | ORAL | Status: DC
  Administered 2020-06-02 – 2020-06-03 (×2): 300 mg via ORAL
  Filled 2020-06-01 (×2): qty 2

## 2020-06-01 MED ORDER — SENNOSIDES-DOCUSATE SODIUM 8.6-50 MG PO TABS
2.0000 | ORAL_TABLET | Freq: Every day | ORAL | Status: DC
  Administered 2020-06-01: 2 via ORAL
  Filled 2020-06-01 (×2): qty 2

## 2020-06-01 MED ORDER — ASPIRIN EC 81 MG PO TBEC
81.0000 mg | DELAYED_RELEASE_TABLET | Freq: Every day | ORAL | Status: DC
  Administered 2020-06-02 – 2020-06-03 (×2): 81 mg via ORAL
  Filled 2020-06-01 (×2): qty 1

## 2020-06-01 MED ORDER — DIGOXIN 125 MCG PO TABS
125.0000 ug | ORAL_TABLET | Freq: Every day | ORAL | Status: DC
  Administered 2020-06-02 – 2020-06-03 (×2): 125 ug via ORAL
  Filled 2020-06-01 (×2): qty 1

## 2020-06-01 MED ORDER — LORATADINE 10 MG PO TABS
10.0000 mg | ORAL_TABLET | Freq: Every day | ORAL | Status: DC
  Administered 2020-06-02 – 2020-06-03 (×2): 10 mg via ORAL
  Filled 2020-06-01 (×2): qty 1

## 2020-06-01 MED ORDER — INSULIN ASPART 100 UNIT/ML ~~LOC~~ SOLN
0.0000 [IU] | SUBCUTANEOUS | Status: DC
  Administered 2020-06-01 – 2020-06-02 (×4): 2 [IU] via SUBCUTANEOUS
  Filled 2020-06-01 (×4): qty 1

## 2020-06-01 MED ORDER — MORPHINE SULFATE (PF) 2 MG/ML IV SOLN
2.0000 mg | Freq: Once | INTRAVENOUS | Status: AC
  Administered 2020-06-02: 2 mg via INTRAVENOUS
  Filled 2020-06-01: qty 1

## 2020-06-01 MED ORDER — GABAPENTIN 100 MG PO CAPS
100.0000 mg | ORAL_CAPSULE | Freq: Every day | ORAL | Status: DC
  Administered 2020-06-02: 100 mg via ORAL
  Filled 2020-06-01: qty 1

## 2020-06-01 MED ORDER — GABAPENTIN 300 MG PO CAPS
300.0000 mg | ORAL_CAPSULE | Freq: Every day | ORAL | Status: DC
  Administered 2020-06-01 – 2020-06-02 (×2): 300 mg via ORAL
  Filled 2020-06-01 (×2): qty 1

## 2020-06-01 MED ORDER — SODIUM CHLORIDE 0.9 % IV SOLN: 250.0000 mL | INTRAVENOUS | Status: DC | PRN

## 2020-06-01 MED ORDER — TAMSULOSIN HCL 0.4 MG PO CAPS
0.4000 mg | ORAL_CAPSULE | Freq: Every day | ORAL | Status: DC
  Administered 2020-06-02 – 2020-06-03 (×2): 0.4 mg via ORAL
  Filled 2020-06-01 (×2): qty 1

## 2020-06-01 MED ORDER — ALBUTEROL SULFATE (2.5 MG/3ML) 0.083% IN NEBU: 2.5000 mg | INHALATION_SOLUTION | RESPIRATORY_TRACT | Status: DC | PRN

## 2020-06-01 MED ORDER — ACETAMINOPHEN 325 MG PO TABS
650.0000 mg | ORAL_TABLET | Freq: Four times a day (QID) | ORAL | Status: DC | PRN
  Administered 2020-06-01 (×2): 650 mg via ORAL
  Filled 2020-06-01 (×2): qty 2

## 2020-06-01 MED ORDER — TRIAMCINOLONE ACETONIDE 0.1 % EX CREA
1.0000 "application " | TOPICAL_CREAM | Freq: Two times a day (BID) | CUTANEOUS | Status: DC | PRN
  Filled 2020-06-01: qty 15

## 2020-06-01 MED ORDER — POLYETHYLENE GLYCOL 3350 17 G PO PACK: 17.0000 g | PACK | Freq: Every day | ORAL | Status: DC | PRN

## 2020-06-01 MED ORDER — APIXABAN 5 MG PO TABS
5.0000 mg | ORAL_TABLET | Freq: Two times a day (BID) | ORAL | Status: DC
  Administered 2020-06-01: 5 mg via ORAL
  Filled 2020-06-01: qty 1

## 2020-06-01 MED ORDER — OXYCODONE-ACETAMINOPHEN 5-325 MG PO TABS
1.0000 | ORAL_TABLET | ORAL | Status: DC | PRN
  Administered 2020-06-01 – 2020-06-03 (×3): 1 via ORAL
  Filled 2020-06-01 (×4): qty 1

## 2020-06-01 MED ORDER — ATORVASTATIN CALCIUM 80 MG PO TABS
80.0000 mg | ORAL_TABLET | Freq: Every day | ORAL | Status: DC
  Administered 2020-06-02 – 2020-06-03 (×2): 80 mg via ORAL
  Filled 2020-06-01 (×2): qty 1

## 2020-06-01 MED ORDER — FUROSEMIDE 10 MG/ML IJ SOLN
40.0000 mg | Freq: Once | INTRAMUSCULAR | Status: AC
  Administered 2020-06-01: 40 mg via INTRAVENOUS
  Filled 2020-06-01: qty 4

## 2020-06-01 MED ORDER — HYDRALAZINE HCL 20 MG/ML IJ SOLN: 5.0000 mg | INTRAMUSCULAR | Status: DC | PRN

## 2020-06-01 MED ORDER — FUROSEMIDE 10 MG/ML IJ SOLN
40.0000 mg | Freq: Two times a day (BID) | INTRAMUSCULAR | Status: DC
  Administered 2020-06-02 – 2020-06-03 (×3): 40 mg via INTRAVENOUS
  Filled 2020-06-01 (×3): qty 4

## 2020-06-01 MED ORDER — FLUTICASONE PROPIONATE 50 MCG/ACT NA SUSP
2.0000 | Freq: Every day | NASAL | Status: DC
  Administered 2020-06-02 – 2020-06-03 (×2): 2 via NASAL
  Filled 2020-06-01: qty 16

## 2020-06-01 MED ORDER — METOPROLOL SUCCINATE ER 25 MG PO TB24
25.0000 mg | ORAL_TABLET | Freq: Every day | ORAL | Status: DC
  Administered 2020-06-02 – 2020-06-03 (×2): 25 mg via ORAL
  Filled 2020-06-01 (×2): qty 1

## 2020-06-01 NOTE — ED Notes (Signed)
Pt sitting in recliner in NAD, pt still has on 4L [place] in place. No needs expressed at this time

## 2020-06-01 NOTE — H&P (Signed)
History and Physical    [name] DXI:[id number] DOB: [birth year] DOA: 06/01/2020  Referring MD/NP/PA:   PCP: Dorothey Baseman, MD   Patient coming from:  The patient is coming from Independent living facility.  At baseline, pt is independent for most of ADL.        Chief Complaint: SOB  HPI: Jeremiah Chapman is a 74 y.o. male with medical history significant of hypertension, hyperlipidemia, diabetes mellitus, CHF with EF 30-35%, CAD, CABG, atrial fibrillation on Eliquis, CKD three, gout, who presents with shortness of breath.  Patient states that he has shortness of breath in the past 3 days, which has been progressively worsening.  Denies cough, chest pain, fever or chills.  Patient also has worsening bilateral lower leg edema.  Denies nausea, vomiting, diarrhea or abdominal pain.  No symptoms of UTI or unilateral weakness.  Patient states that he has left shoulder pain in the past 4 days, denies any injury.  ED Course: pt was found to have BNP 1909, troponin 56, negative COVID-19 PCR, renal function stable, temperature normal, blood pressure 152/68, heart rate is 78, oxygen saturation 94% on 4 L nasal cannula oxygen (patient does not have oxygen desaturation on home level 2 L oxygen, increased oxygen for comfort).  Patient is admitted to progressive bed as inpatient.   Chest x-ray: 1. Reaccumulation of pleural fluid in the RIGHT chest with moderately large RIGHT pleural effusion tracking into the mid RIGHT chest. 2. Small LEFT-sided effusion. 3. Dense opacification at the RIGHT lung base does not allow for the ability to exclude infection. Would suggest correlation with any clinical or laboratory evidence of heart failure based on appearance of the chest. 4. Dense opacification throughout the entire lower half of the RIGHT chest. Again on the basis of plain film radiography the possibility of underlying lesion is not excluded.  Review of Systems:   General: no fevers, chills,  no body weight gain, has fatigue HEENT: no blurry vision, hearing changes or sore throat Respiratory: has dyspnea, no coughing, wheezing CV: no chest pain, no palpitations GI: no nausea, vomiting, abdominal pain, diarrhea, constipation GU: no dysuria, burning on urination, increased urinary frequency, hematuria  Ext: has leg edema Neuro: no unilateral weakness, numbness, or tingling, no vision change or hearing loss Skin: no rash. Has a small skin ulcer in left ankle MSK: No muscle spasm, no deformity, no limitation of range of movement in spin Heme: No easy bruising.  Travel history: No recent long distant travel.  Allergy:  Allergies  Allergen Reactions  . Sitagliptin Rash and Other (See Comments)    Januvia    Past Medical History:  Diagnosis Date  . Atrial fibrillation (HCC)   . CAD (coronary artery disease)   . CHF (congestive heart failure) (HCC)   . Diabetes mellitus without complication (HCC)   . Gout   . Hypercholesterolemia   . Hypertension     Past Surgical History:  Procedure Laterality Date  . CARDIAC CATHETERIZATION    . CARDIOVERSION N/A 01/20/2017   Procedure: CARDIOVERSION;  Surgeon: Marcina Millard, MD;  Location: [facility] ORS;  Service: Cardiovascular;  Laterality: N/A;  . COLONOSCOPY    . CORONARY ARTERY BYPASS GRAFT    . CORONARY STENT INTERVENTION N/A 03/28/2017   Procedure: Coronary Stent Intervention;  Surgeon: Marcina Millard, MD;  Location: [facility] INVASIVE CV LAB;  Service: Cardiovascular;  Laterality: N/A;  . LEFT HEART CATH AND CORONARY ANGIOGRAPHY N/A 03/28/2017   Procedure: Left Heart Cath and Coronary Angiography;  Surgeon: Marcina Millard, MD;  Location: [facility] INVASIVE CV LAB;  Service: Cardiovascular;  Laterality: N/A;  . LEFT HEART CATH AND CORS/GRAFTS ANGIOGRAPHY N/A 10/30/2019   Procedure: LEFT HEART CATH AND CORS/GRAFTS ANGIOGRAPHY;  Surgeon: Lamar Blinks, MD;  Location: [facility] INVASIVE CV LAB;  Service: Cardiovascular;  Laterality:  N/A;  . limbar back surgery    . PENILE PROSTHESIS IMPLANT      Social History:  reports that he has quit smoking. His smoking use included cigarettes. He has a 25.00 pack-year smoking history. He uses smokeless tobacco. He reports current alcohol use. He reports that he does not use drugs.  Family History:  Family History  Problem Relation Age of Onset  . Hypertension Mother      Prior to Admission medications   Medication Sig Start Date End Date Taking? Authorizing Provider  albuterol (VENTOLIN HFA) 108 (90 Base) MCG/ACT inhaler Inhale into the lungs. 02/08/20   [provider]  amoxicillin (AMOXIL) 500 MG capsule TAKE 4 CAPSULES BY MOUTH 1 HOUR BEFORE DENTAL APPOINTMENT 01/08/20   [provider]  apixaban (ELIQUIS) 5 MG TABS tablet Take 5 mg by mouth every 12 (twelve) hours.     [provider]  aspirin 81 MG EC tablet Take 81 mg by mouth daily.     [provider]  atorvastatin (LIPITOR) 80 MG tablet TAKE 1 TABLET BY MOUTH EVERY DAY 03/17/20   [provider]  B-D ULTRAFINE III SHORT PEN 31G X 8 MM MISC 2 (two) times daily. 06/15/19   [provider]  digoxin (LANOXIN) 0.125 MG tablet TAKE 1 TABLET BY MOUTH EVERY DAY 03/17/20   [provider]  fluticasone (FLONASE) 50 MCG/ACT nasal spray Place into the nose. 02/19/20 02/18/21  [provider]  furosemide (LASIX) 20 MG tablet Take 1 tablet (20 mg total) by mouth 2 (two) times daily. 05/28/19   Auburn Bilberry, MD  gabapentin (NEURONTIN) 100 MG capsule Take 100-300 mg by mouth See admin instructions. Take 1 capsule (100mg ) by mouth at dinnertime and take 3 capsules (300mg ) by mouth at bedtime    [provider]  glimepiride (AMARYL) 4 MG tablet Take 4 mg by mouth daily with breakfast.     [provider]  glucose blood (FREESTYLE LITE) test strip USE TWICE DAILY AS DIRECTED 06/15/19   [provider]  Insulin Degludec 200 UNIT/ML SOPN Inject 8  Units into the skin daily. 11/06/19   06-15-1974, MD  irbesartan (AVAPRO) 300 MG tablet TAKE 1 TABLET BY MOUTH EVERY DAY 03/17/20   [provider]  loratadine (CLARITIN REDITABS) 10 MG dissolvable tablet Take by mouth. 02/19/20 02/18/21  [provider]  metFORMIN (GLUCOPHAGE-XR) 500 MG 24 hr tablet Take 1,000 mg by mouth 2 (two) times daily.     [provider]  metoprolol succinate (TOPROL-XL) 25 MG 24 hr tablet Take 25 mg by mouth daily. 01/15/20   [provider]  polyethylene glycol (MIRALAX / GLYCOLAX) 17 g packet Take 17 g by mouth daily. 11/07/19   01/17/20, MD  senna-docusate (SENOKOT-S) 8.6-50 MG tablet Take 2 tablets by mouth at bedtime. 11/06/19   03-07-1979, MD  tamsulosin (FLOMAX) 0.4 MG CAPS capsule Take 0.4 mg by mouth daily. Take after the same meal each day. 07/20/19 07/19/20  [provider]  triamcinolone cream (KENALOG) 0.1 % Apply 1 application topically 2 (two) times daily as needed (skin irritations).     [provider]  Physical Exam: Vitals:   06/01/20 1200 06/01/20 1230 06/01/20 1430 06/01/20 1638  BP: (!) 152/68 96/66 (!) 156/81 (!) 147/76  Pulse:    77  Resp: (!) 22 (!) 30 (!) 21 17  Temp:    97.8 F (36.6 C)  TempSrc:      SpO2:    100%  Weight:      Height:       General: Not in acute distress HEENT:       Eyes: PERRL, EOMI, no scleral icterus.       ENT: No discharge from the ears and nose, no pharynx injection, no tonsillar enlargement.        Neck: positive JVD, no bruit, no mass felt. Heme: No neck lymph node enlargement. Cardiac: S1/S2, RRR, No murmurs, No gallops or rubs. Respiratory: No rales, wheezing, rhonchi or rubs. GI: Soft, nondistended, nontender, no rebound pain, no organomegaly, BS present. GU: No hematuria Ext: 2+ pitting leg edema bilaterally. 1+DP/PT pulse bilaterally. Musculoskeletal: No joint deformities, No joint redness or warmth, no limitation of ROM in  spin. Skin: No rashes. Has a small skin ulcer in left ankle Neuro: Alert, oriented X3, cranial nerves II-XII grossly intact, moves all extremities normally.  Psych: Patient is not psychotic, no suicidal or hemocidal ideation.  Labs on Admission: I have personally reviewed following labs and imaging studies  CBC: Recent Labs  Lab 06/01/20 1122  WBC 7.3  NEUTROABS 5.9  HGB 11.7*  HCT 36.0*  MCV 94.7  PLT 208   Basic Metabolic Panel: Recent Labs  Lab 06/01/20 1122  NA 142  K 4.2  CL 100  CO2 28  GLUCOSE 113*  BUN 25*  CREATININE 1.31*  CALCIUM 8.9   GFR: Estimated Creatinine Clearance: 51.1 mL/min (A) (by C-G formula based on SCr of 1.31 mg/dL (H)). Liver Function Tests: Recent Labs  Lab 06/01/20 1122  AST 18  ALT 10  ALKPHOS 82  BILITOT 2.3*  PROT 7.4  ALBUMIN 3.5   No results for input(s): LIPASE, AMYLASE in the last 168 hours. No results for input(s): AMMONIA in the last 168 hours. Coagulation Profile: No results for input(s): INR, PROTIME in the last 168 hours. Cardiac Enzymes: No results for input(s): CKTOTAL, CKMB, CKMBINDEX, TROPONINI in the last 168 hours. BNP (last 3 results) No results for input(s): PROBNP in the last 8760 hours. HbA1C: No results for input(s): HGBA1C in the last 72 hours. CBG: Recent Labs  Lab 06/01/20 1651  GLUCAP 122*   Lipid Profile: No results for input(s): CHOL, HDL, LDLCALC, TRIG, CHOLHDL, LDLDIRECT in the last 72 hours. Thyroid Function Tests: No results for input(s): TSH, T4TOTAL, FREET4, T3FREE, THYROIDAB in the last 72 hours. Anemia Panel: No results for input(s): VITAMINB12, FOLATE, FERRITIN, TIBC, IRON, RETICCTPCT in the last 72 hours. Urine analysis:    Component Value Date/Time   COLORURINE YELLOW (A) 10/29/2019 0704   APPEARANCEUR HAZY (A) 10/29/2019 0704   LABSPEC 1.017 10/29/2019 0704   PHURINE 5.0 10/29/2019 0704   GLUCOSEU >=500 (A) 10/29/2019 0704   HGBUR MODERATE (A) 10/29/2019 0704   BILIRUBINUR  NEGATIVE 10/29/2019 0704   KETONESUR NEGATIVE 10/29/2019 0704   PROTEINUR >=300 (A) 10/29/2019 0704   NITRITE NEGATIVE 10/29/2019 0704   LEUKOCYTESUR NEGATIVE 10/29/2019 0704   Sepsis Labs: @LABRCNTIP (procalcitonin:4,lacticidven:4) ) Recent Results (from the past 240 hour(s))  SARS Coronavirus 2 by RT PCR (hospital order, performed in [facility] hospital lab) Nasopharyngeal Nasopharyngeal Swab     Status: None   Collection  Time: 06/01/20 12:23 PM   Specimen: Nasopharyngeal Swab  Result Value Ref Range Status   SARS Coronavirus 2 NEGATIVE NEGATIVE Final    Comment: (NOTE) SARS-CoV-2 target nucleic acids are NOT DETECTED.  The SARS-CoV-2 RNA is generally detectable in upper and lower respiratory specimens during the acute phase of infection. The lowest concentration of SARS-CoV-2 viral copies this assay can detect is 250 copies / mL. A negative result does not preclude SARS-CoV-2 infection and should not be used as the sole basis for treatment or other patient management decisions.  A negative result may occur with improper specimen collection / handling, submission of specimen other than nasopharyngeal swab, presence of viral mutation(s) within the areas targeted by this assay, and inadequate number of viral copies (<250 copies / mL). A negative result must be combined with clinical observations, patient history, and epidemiological information.  Fact Sheet for Patients:   BoilerBrush.com.cy  Fact Sheet for Healthcare Providers: https://pope.com/  This test is not yet approved or  cleared by the [place] FDA and has been authorized for detection and/or diagnosis of SARS-CoV-2 by FDA under an Emergency Use Authorization (EUA).  This EUA will remain in effect (meaning this test can be used) for the duration of the COVID-19 declaration under Section 564(b)(1) of the Act, 21 U.S.C. section 360bbb-3(b)(1), unless the  authorization is terminated or revoked sooner.  Performed at [facility], [address], [place], [place] [address]      Radiological Exams on Admission: DG Chest Portable 1 View  Result Date: 06/01/2020 CLINICAL DATA:  Shortness of breath.  Arrived via EMS. EXAM: PORTABLE CHEST 1 VIEW COMPARISON:  04/07/2020 FINDINGS: Reaccumulation of pleural fluid in the RIGHT chest with moderately large RIGHT pleural effusion tracking into the mid RIGHT chest. Dense opacification throughout the entire lower half of the RIGHT chest. Cardiomediastinal contours stable enlarged following median sternotomy where they can be assessed. Obscured along the RIGHT heart border. Dense opacification at the RIGHT lung base may be due to volume loss, infection is difficult to exclude. Blunting also the LEFT costodiaphragmatic sulcus. Trachea is midline. No acute skeletal process to the extent evaluated. IMPRESSION: 1. Reaccumulation of pleural fluid in the RIGHT chest with moderately large RIGHT pleural effusion tracking into the mid RIGHT chest. 2. Small LEFT-sided effusion. 3. Dense opacification at the RIGHT lung base does not allow for the ability to exclude infection. Would suggest correlation with any clinical or laboratory evidence of heart failure based on appearance of the chest. 4. Dense opacification throughout the entire lower half of the RIGHT chest. Again on the basis of plain film radiography the possibility of underlying lesion is not excluded. Electronically Signed   By: Donzetta Kohut M.D.   On: 06/01/2020 11:50     EKG: Independently reviewed.  Atrial fibrillation, QTc 527, bifascicular block  Assessment/Plan Principal Problem:   Acute on chronic systolic CHF (congestive heart failure) (HCC) Active Problems:   Atrial fibrillation (HCC)   CAD (coronary artery disease)   Essential hypertension   Hyperlipidemia   Type II diabetes mellitus with renal manifestations (HCC)   Elevated troponin    CKD (chronic kidney disease), stage IIIa   Pleural effusion   Acute on chronic systolic (congestive) heart failure (HCC)   Acute on chronic systolic CHF (congestive heart failure) [facility]): Patient has 2+ leg edema positive JVD, elevated BNP, clinically consistent with CHF exacerbation.  2D echo on 10/30/2019 showed EF of 30-35%.  -Will admit to progressive unit as inpatient -  Lasix 40 mg bid by IV -trend trop -2d echo -Daily weights -strict I/O's -Low salt diet -Fluid restriction -Obtain REDs Vest reading  Atrial fibrillation (HCC) -hold Eliquis due to need of thoracentesis -Continue digoxin, metoprolol  CAD (coronary artery disease) and evaded troponin: S/p of CABG. troponin 56: No chest pain.  Most likely due to demand ischemia secondary to CHF exacerbation. -Continue aspirin, Lipitor, metoprolol -Check A1c, FLP -Trend troponin -Repeat EKG in the morning -Follow-up 2D echo  Essential hypertension -IV hydralazine as needed -Patient is on IV Lasix -Continue irbesartan, metoprolol,  Hyperlipidemia -Lipitor  Type II diabetes mellitus with renal manifestations [facility](HCC): Recent A1c 6.5, well controlled.  Patient is taking Metformin, Amaryl, degludec insulin -Sliding scale insulin -Decrease degludec dose from 8 to 5 unit daily  CKD (chronic kidney disease), stage IIIa: Stable -Follow-up renal function by BMP  Pleural effusion: Patient has recurrent right pleural effusion. Will need thoracentesis if it does not improve with IV diuretics -Hold Eliquis now -may need thoracentesis (not ordered yet)  Left shoulder pain: -As needed Tylenol and Percocet -Follow-up x-ray of left shoulder      DVT ppx: SCD Code Status: Full code Family Communication:  Yes, patient's son by phone    Disposition Plan:  Anticipate discharge back to previous environment Consults called:  none Admission status:  progressive unit  as inpt     Status is: Inpatient  Remains inpatient appropriate  because:Inpatient level of care appropriate due to severity of illness .  Patient has multiple comorbidities, including CHF with EF 30 to 35%.  He presents with CHF exacerbation.  Also has elevated troponin.  Given his old age, and complicated comorbidities, patient is at high risk of deteriorating.  Need to be treated in hospital for at least 2 days.   Dispo: The patient is from: Independent living facility              Anticipated d/c is to: Independent living facility              Anticipated d/c date is: 2 days              Patient currently is not medically stable to d/c.            Date of Service 06/01/2020    Lorretta HarpXilin Ryelle Ruvalcaba [facility]   If 7PM-7AM, please contact night-coverage [email or web address] 06/01/2020, 4:57 PM

## 2020-06-01 NOTE — ED Notes (Signed)
Attempted to call report to 2A, was told by secretary to call RN back in 5 mins on [phone]

## 2020-06-01 NOTE — Plan of Care (Signed)
Discussed with patient of heart failure and difficulty in breathing

## 2020-06-01 NOTE — ED Provider Notes (Signed)
[facility] Emergency Department Provider Note  ____________________________________________   First MD Initiated Contact with Patient 06/01/20 1111     (approximate)  I have reviewed the triage vital signs and the nursing notes.   HISTORY  Chief Complaint Shortness of Breath    HPI Jeremiah MoralesKenneth D Chapman is a 74 y.o. male with atrial fibrillation, diabetes, hypertension, hyperlipidemia who comes in with shortness of breath.  Patient does take Eliquis.  Patient states that he has felt more short of breath over the past 2 days, constant, nothing makes it better, worse with movement.  States he is needed to have thoracentesis of his right lung previously.  He states he has had some left shoulder pain that seems more chronic in nature.  Getting some pain patches for that.  He also has a wound to his left lateral lower leg that looks consistent with an ulcer.  Patient does report being diabetic.  Patient is on 2 L of oxygen at baseline and had to increase himself up to 4 L recently.  Denies any falls causing the shoulder pain.          Past Medical History:  Diagnosis Date  . Atrial fibrillation (HCC)   . CAD (coronary artery disease)   . CHF (congestive heart failure) (HCC)   . Diabetes mellitus without complication (HCC)   . Gout   . Hypercholesterolemia   . Hypertension     Patient Active Problem List   Diagnosis Date Noted  . Hypoglycemia 10/29/2019  . Sepsis (HCC) 10/29/2019  . Acute on chronic systolic CHF (congestive heart failure) (HCC) 10/29/2019  . NSTEMI (non-ST elevated myocardial infarction) (HCC) 10/29/2019  . Acute on chronic diastolic CHF (congestive heart failure) (HCC) 05/26/2019  . PNA (pneumonia) 04/21/2017  . S/P cardiac catheterization 04/05/2017  . Bradycardia 03/26/2017  . Elevated troponin 03/26/2017  . AKI (acute kidney injury) (HCC) 03/26/2017  . SOB (shortness of breath) 04/15/2016  . CAD (coronary artery disease) 10/21/2015   . Hyperlipidemia 02/12/2015  . Paresthesia of foot, bilateral 02/12/2015  . Hyperlipidemia due to type 2 diabetes mellitus (HCC) 02/12/2015  . H/O adenomatous polyp of colon 01/08/2015  . Essential hypertension 10/14/2014  . Type 2 diabetes mellitus without complication (HCC) 10/14/2014  . 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 Atrial fibrillation (HCC) 06/07/2014  . Gout 06/07/2014    Past Surgical History:  Procedure Laterality Date  . CARDIAC CATHETERIZATION    . CARDIOVERSION N/A 01/20/2017   Procedure: CARDIOVERSION;  Surgeon: Marcina MillardAlexander Paraschos, MD;  Location: [facility] ORS;  Service: Cardiovascular;  Laterality: N/A;  . COLONOSCOPY    . CORONARY ARTERY BYPASS GRAFT    . CORONARY STENT INTERVENTION N/A 03/28/2017   Procedure: Coronary Stent Intervention;  Surgeon: Marcina MillardAlexander Paraschos, MD;  Location: [facility] INVASIVE CV LAB;  Service: Cardiovascular;  Laterality: N/A;  . LEFT HEART CATH AND CORONARY ANGIOGRAPHY N/A 03/28/2017   Procedure: Left Heart Cath and Coronary Angiography;  Surgeon: Marcina MillardAlexander Paraschos, MD;  Location: [facility] INVASIVE CV LAB;  Service: Cardiovascular;  Laterality: N/A;  . LEFT HEART CATH AND CORS/GRAFTS ANGIOGRAPHY N/A 10/30/2019   Procedure: LEFT HEART CATH AND CORS/GRAFTS ANGIOGRAPHY;  Surgeon: Lamar BlinksKowalski, Bruce J, MD;  Location: [facility] INVASIVE CV LAB;  Service: Cardiovascular;  Laterality: N/A;  . limbar back surgery    . PENILE PROSTHESIS IMPLANT      Prior to Admission medications   Medication Sig Start Date End Date Taking? Authorizing Provider  albuterol (VENTOLIN HFA) 108 (90 Base) MCG/ACT  inhaler Inhale into the lungs. 02/08/20   [provider]  amoxicillin (AMOXIL) 500 MG capsule TAKE 4 CAPSULES BY MOUTH 1 HOUR BEFORE DENTAL APPOINTMENT 01/08/20   [provider]  apixaban (ELIQUIS) 5 MG TABS tablet Take 5 mg by mouth every 12 (twelve) hours.     [provider]  aspirin 81 MG EC tablet Take 81 mg by mouth daily.     [provider]    atorvastatin (LIPITOR) 80 MG tablet TAKE 1 TABLET BY MOUTH EVERY DAY 03/17/20   [provider]  B-D ULTRAFINE III SHORT PEN 31G X 8 MM MISC 2 (two) times daily. 06/15/19   [provider]  digoxin (LANOXIN) 0.125 MG tablet TAKE 1 TABLET BY MOUTH EVERY DAY 03/17/20   [provider]  fluticasone (FLONASE) 50 MCG/ACT nasal spray Place into the nose. 02/19/20 02/18/21  [provider]  furosemide (LASIX) 20 MG tablet Take 1 tablet (20 mg total) by mouth 2 (two) times daily. 05/28/19   Auburn Bilberry, MD  gabapentin (NEURONTIN) 100 MG capsule Take 100-300 mg by mouth See admin instructions. Take 1 capsule (100mg ) by mouth at dinnertime and take 3 capsules (300mg ) by mouth at bedtime    [provider]  glimepiride (AMARYL) 4 MG tablet Take 4 mg by mouth daily with breakfast.     [provider]  glucose blood (FREESTYLE LITE) test strip USE TWICE DAILY AS DIRECTED 06/15/19   [provider]  Insulin Degludec 200 UNIT/ML SOPN Inject 8 Units into the skin daily. 11/06/19   06-15-1974, MD  irbesartan (AVAPRO) 300 MG tablet TAKE 1 TABLET BY MOUTH EVERY DAY 03/17/20   [provider]  loratadine (CLARITIN REDITABS) 10 MG dissolvable tablet Take by mouth. 02/19/20 02/18/21  [provider]  metFORMIN (GLUCOPHAGE-XR) 500 MG 24 hr tablet Take 1,000 mg by mouth 2 (two) times daily.     [provider]  metoprolol succinate (TOPROL-XL) 25 MG 24 hr tablet Take 25 mg by mouth daily. 01/15/20   [provider]  polyethylene glycol (MIRALAX / GLYCOLAX) 17 g packet Take 17 g by mouth daily. 11/07/19   01/17/20, MD  senna-docusate (SENOKOT-S) 8.6-50 MG tablet Take 2 tablets by mouth at bedtime. 11/06/19   03-07-1979, MD  tamsulosin (FLOMAX) 0.4 MG CAPS capsule Take 0.4 mg by mouth daily. Take after the same meal each day. 07/20/19 07/19/20  [provider]  triamcinolone cream (KENALOG) 0.1 % Apply 1  application topically 2 (two) times daily as needed (skin irritations).     [provider]    Allergies Sitagliptin  Family History  Problem Relation Age of Onset  . Hypertension Mother     Social History Social History   Tobacco Use  . Smoking status: Former Smoker    Packs/day: 0.50    Years: 50.00    Pack years: 25.00    Types: Cigarettes  . Smokeless tobacco: Current User  Substance Use Topics  . Alcohol use: Yes    Alcohol/week: 0.0 standard drinks  . Drug use: No      Review of Systems Constitutional: No fever/chills Eyes: No visual changes. ENT: No sore throat. Cardiovascular: No chest pain Respiratory: Positive for SOB Gastrointestinal: No abdominal pain.  No nausea, no vomiting.  No diarrhea.  No constipation. Genitourinary: Negative for dysuria. Musculoskeletal: Negative for back pain.  Shoulder pain Skin: Ulcer on the left leg Neurological: Negative for headaches, focal weakness or numbness.  All other ROS negative ____________________________________________   PHYSICAL EXAM:  VITAL SIGNS: ED Triage Vitals [06/01/20 1114]  Enc Vitals Group     BP      Pulse      Resp      Temp      Temp src      SpO2      Weight 170 lb (77.1 kg)     Height  (1.676 m)     Head Circumference      Peak Flow      Pain Score      Pain Loc      Pain Edu?      Excl. in GC?     Constitutional: Alert and oriented. Well appearing and in no acute distress. Eyes: Conjunctivae are normal. EOMI. Head: Atraumatic. Nose: No congestion/rhinnorhea. Mouth/Throat: Mucous membranes are moist.   Neck: No stridor. Trachea Midline. FROM Cardiovascular: Normal rate, regular rhythm. Grossly normal heart sounds.  Good peripheral circulation. Respiratory: Decreased breath sounds on the lower right, increased work of breathing Gastrointestinal: Soft and nontender. No distention. No abdominal bruits.  Musculoskeletal: No lower extremity tenderness nor edema.  No  joint effusions.  ulcer noted on the lateral side of the left leg.  No erythema.  The extremity feels warm.  No rash or deformity noted over the left shoulder Neurologic:  Normal speech and language. No gross focal neurologic deficits are appreciated.  Skin:  Skin is warm, dry and intact. No rash noted. Psychiatric: Mood and affect are normal. Speech and behavior are normal. GU: Deferred   ____________________________________________   LABS (all labs ordered are listed, but only abnormal results are displayed)  Labs Reviewed  CBC WITH DIFFERENTIAL/PLATELET - Abnormal; Notable for the following components:      Result Value   RBC 3.80 (*)    Hemoglobin 11.7 (*)    HCT 36.0 (*)    RDW 15.9 (*)    All other components within normal limits  COMPREHENSIVE METABOLIC PANEL - Abnormal; Notable for the following components:   Glucose, Bld 113 (*)    BUN 25 (*)    Creatinine, Ser 1.31 (*)    Total Bilirubin 2.3 (*)    GFR calc non Af Amer 53 (*)    All other components within normal limits  BRAIN NATRIURETIC PEPTIDE - Abnormal; Notable for the following components:   B Natriuretic Peptide 1,909.0 (*)    All other components within normal limits  TROPONIN I (HIGH SENSITIVITY) - Abnormal; Notable for the following components:   Troponin I (High Sensitivity) 56 (*)    All other components within normal limits  SARS CORONAVIRUS 2 BY RT PCR (HOSPITAL ORDER, PERFORMED IN [facility])  PROCALCITONIN  TROPONIN I (HIGH SENSITIVITY)   ____________________________________________   ED ECG REPORT I, Concha Se, the attending physician, personally viewed and interpreted this ECG.  A. fib rate of 113, no ST elevations difficult appreciate T wave inversions.  Does have a little bit of ST depression in V2 and frequent PVCs he is a right bundle branch block ____________________________________________  RADIOLOGY I, Concha Se, personally viewed and evaluated these images (plain  radiographs) as part of my medical decision making, as well as reviewing the written report by the radiologist.  ED MD interpretation: Pleural effusion noted on the right  Official radiology report(s): DG Chest Portable 1 View  Result Date: 06/01/2020 CLINICAL DATA:  Shortness of breath.  Arrived via EMS. EXAM: PORTABLE CHEST 1  VIEW COMPARISON:  04/07/2020 FINDINGS: Reaccumulation of pleural fluid in the RIGHT chest with moderately large RIGHT pleural effusion tracking into the mid RIGHT chest. Dense opacification throughout the entire lower half of the RIGHT chest. Cardiomediastinal contours stable enlarged following median sternotomy where they can be assessed. Obscured along the RIGHT heart border. Dense opacification at the RIGHT lung base may be due to volume loss, infection is difficult to exclude. Blunting also the LEFT costodiaphragmatic sulcus. Trachea is midline. No acute skeletal process to the extent evaluated. IMPRESSION: 1. Reaccumulation of pleural fluid in the RIGHT chest with moderately large RIGHT pleural effusion tracking into the mid RIGHT chest. 2. Small LEFT-sided effusion. 3. Dense opacification at the RIGHT lung base does not allow for the ability to exclude infection. Would suggest correlation with any clinical or laboratory evidence of heart failure based on appearance of the chest. 4. Dense opacification throughout the entire lower half of the RIGHT chest. Again on the basis of plain film radiography the possibility of underlying lesion is not excluded. Electronically Signed   By: Donzetta Kohut M.D.   On: 06/01/2020 11:50    ____________________________________________   PROCEDURES  Procedure(s) performed (including Critical Care):  .Critical Care Performed by: Concha Se, MD Authorized by: Concha Se, MD   Critical care provider statement:    Critical care time (minutes):  35   Critical care was necessary to treat or prevent imminent or life-threatening  deterioration of the following conditions:  Respiratory failure   Critical care was time spent personally by me on the following activities:  Discussions with consultants, evaluation of patient's response to treatment, examination of patient, ordering and performing treatments and interventions, ordering and review of laboratory studies, ordering and review of radiographic studies, pulse oximetry, re-evaluation of patient's condition, obtaining history from patient or surrogate and review of old charts     ____________________________________________   INITIAL IMPRESSION / ASSESSMENT AND PLAN / ED COURSE   Jeremiah Chapman was evaluated in Emergency Department on 06/01/2020 for the symptoms described in the history of present illness. He was evaluated in the context of the global COVID-19 pandemic, which necessitated consideration that the patient might be at risk for infection with the SARS-CoV-2 virus that causes COVID-19. Institutional protocols and algorithms that pertain to the evaluation of patients at risk for COVID-19 are in a state of rapid change based on information released by regulatory bodies including the CDC and federal and state organizations. These policies and algorithms were followed during the patient's care in the ED.     Pt presents with SOB. Differential includes: PNA-will get xray to evaluation Pleural effusion-will get x-ray Anemia-CBC to evaluate ACS- will get trops Arrhythmia-Will get EKG and keep on monitor.  COVID- will get testing per algorithm. PE-lower suspicion given no risk factors and other cause more likely given patient is on a blood thinner  Patient's BNP is significantly elevated from baseline.  We will give a dose of Lasix.  Patient turned up from 2 L to 4 L and his work of breathing is much improved.  He is able to lay flat and on his side without any significant difficulties.  His chest x-ray does show a pleural effusion which I suspect is causing his  increased work of breathing.  Patient will need admitted for continued IV diuresis as well as consideration of thoracentesis.  At this time I have low suspicion for sepsis, bacteremia or pneumonia given he has no fever and procalcitonin is  negative.  Do not think he needs antibiotics at this time.  I discussed with the hospital team for admission.        ____________________________________________   FINAL CLINICAL IMPRESSION(S) / ED DIAGNOSES   Final diagnoses:  Acute respiratory failure with hypoxia (HCC)  Pleural effusion  Acute on chronic heart failure, unspecified heart failure type (HCC)     MEDICATIONS GIVEN DURING THIS VISIT:  Medications  furosemide (LASIX) injection 40 mg (has no administration in time range)     ED Discharge Orders    None       Note:  This document was prepared using Dragon voice recognition software and may include unintentional dictation errors.   Concha Se, MD 06/01/20 1308

## 2020-06-01 NOTE — ED Triage Notes (Signed)
Pt arrives via [facility] from [facility] for reports of increasing shob over the last 2-3 days. Pt reports he chronically wears 2L O2 but increased to 4L yesterday. Pt denies CP but reports left shoulder pain x 4 days. PT A&Ox4 and in NAD. Pt having difficulty speaking in long sentences without becoming shob. Pt w/ hx afib that he takes eliquis and asa for. Pt has a small wound to the left ankle and reports no recent injury.

## 2020-06-02 ENCOUNTER — Inpatient Hospital Stay: Payer: Medicare Other

## 2020-06-02 LAB — GLUCOSE, CAPILLARY
Glucose-Capillary: 123 mg/dL — ABNORMAL HIGH (ref 70–99)
Glucose-Capillary: 164 mg/dL — ABNORMAL HIGH (ref 70–99)
Glucose-Capillary: 173 mg/dL — ABNORMAL HIGH (ref 70–99)
Glucose-Capillary: 177 mg/dL — ABNORMAL HIGH (ref 70–99)
Glucose-Capillary: 88 mg/dL (ref 70–99)
Glucose-Capillary: 99 mg/dL (ref 70–99)

## 2020-06-02 LAB — LIPID PANEL
Cholesterol: 108 mg/dL (ref 0–200)
HDL: 45 mg/dL (ref >40)
LDL Cholesterol: 50 mg/dL (ref 0–99)
Total CHOL/HDL Ratio: 2.4 RATIO
Triglycerides: 65 mg/dL (ref <150)
VLDL: 13 mg/dL (ref 0–40)

## 2020-06-02 LAB — BASIC METABOLIC PANEL
Anion gap: 12 (ref 5–15)
BUN: 26 mg/dL — ABNORMAL HIGH (ref 8–23)
CO2: 31 mmol/L (ref 22–32)
Calcium: 8.9 mg/dL (ref 8.9–10.3)
Chloride: 97 mmol/L — ABNORMAL LOW (ref 98–111)
Creatinine, Ser: 1.48 mg/dL — ABNORMAL HIGH (ref 0.61–1.24)
GFR calc Af Amer: 53 mL/min — ABNORMAL LOW (ref >60)
GFR calc non Af Amer: 46 mL/min — ABNORMAL LOW (ref >60)
Glucose, Bld: 96 mg/dL (ref 70–99)
Potassium: 3.2 mmol/L — ABNORMAL LOW (ref 3.5–5.1)
Sodium: 140 mmol/L (ref 135–145)

## 2020-06-02 LAB — PROCALCITONIN: Procalcitonin: <0.1 ng/mL

## 2020-06-02 MED ORDER — INSULIN ASPART 100 UNIT/ML ~~LOC~~ SOLN
0.0000 [IU] | Freq: Three times a day (TID) | SUBCUTANEOUS | Status: DC
  Administered 2020-06-03: 1 [IU] via SUBCUTANEOUS
  Administered 2020-06-03: 2 [IU] via SUBCUTANEOUS
  Filled 2020-06-02 (×2): qty 1

## 2020-06-02 MED ORDER — ENSURE ENLIVE PO LIQD
237.0000 mL | Freq: Two times a day (BID) | ORAL | Status: DC
  Administered 2020-06-02 – 2020-06-03 (×3): 237 mL via ORAL

## 2020-06-02 MED ORDER — POTASSIUM CHLORIDE 20 MEQ PO PACK
40.0000 meq | PACK | Freq: Once | ORAL | Status: AC
  Administered 2020-06-02: 40 meq via ORAL
  Filled 2020-06-02: qty 2

## 2020-06-02 MED ORDER — ADULT MULTIVITAMIN W/MINERALS CH
1.0000 | ORAL_TABLET | Freq: Every day | ORAL | Status: DC
  Administered 2020-06-03: 1 via ORAL
  Filled 2020-06-02: qty 1

## 2020-06-02 MED ORDER — LABETALOL HCL 5 MG/ML IV SOLN: 10.0000 mg | INTRAVENOUS | Status: DC | PRN

## 2020-06-02 NOTE — Progress Notes (Signed)
OT Cancellation Note  Patient Details Name: Jeremiah Chapman MRN: [id number] DOB: [birth year]   Cancelled Treatment:    Reason Eval/Treat Not Completed: Patient at procedure or test/ unavailable. Consult received, chart reviewed. Pt out for imaging upon attempt (pt pending cervical and thoracic spinal imaging). Also pending thoracentesis. Will re-attempt OT evaluation this afternoon as pt is available and medically appropriate.   Richrd Prime, MPH, MS, OTR/L ascom [phone] 06/02/20, 11:06 AM

## 2020-06-02 NOTE — Progress Notes (Signed)
PROGRESS NOTE    RISHARD DELANGE  FEX:[id number] DOB: 02/22/1946 DOA: 06/01/2020 PCP: Juluis Pitch, MD   Chief complaint.  Shortness of breath.   Brief Narrative:  Jeremiah Chapman is a 74 y.o. male with medical history significant of hypertension, hyperlipidemia, diabetes mellitus, CHF with EF 30-35%, CAD, CABG, atrial fibrillation on Eliquis, CKD three, gout, who presents with shortness of breath. He had a elevated BNP at 1909, mild elevation troponin 56, chest x-ray showed moderate amount of pleural effusion on the right side.  There is some opacity in the right side.  Procalcitonin level less than 0.1.  Patient is started on IV Lasix.  Assessment & Plan:   Principal Problem:   Acute on chronic systolic CHF (congestive heart failure) (HCC) Active Problems:   Atrial fibrillation (HCC)   CAD (coronary artery disease)   Essential hypertension   Hyperlipidemia   Type II diabetes mellitus with renal manifestations (HCC)   Elevated troponin   CKD (chronic kidney disease), stage IIIa   Pleural effusion   Acute on chronic systolic (congestive) heart failure (HCC)   Left shoulder pain  #1.  Acute on chronic systolic congestive heart failure.   Ejection fraction 30 to 35%.  Patient has evidence of volume overload.  Continue IV Lasix for now.  Patient short of breath seem to be improving.  We will also obtain thoracentesis for pleural effusion.  2.  Right-sided pleural effusion. Most likely due to congestive heart failure.  No evidence of infection or pneumonia.  Procalcitonin level less than 0.1.  Will obtain thoracentesis.  3.  Hypokalemia. Secondary to diuretics.  Supplement orally per recheck level tomorrow with magnesium level.  4.  Atrial fibrillation. Hold Eliquis for thoracentesis.  5.  Essential hypertension. Continue home medicines.  6.  Type 2 diabetes. Relatively controlled.  Continue sliding scale insulin.  Hold oral diabetic medication while in the  hospital.     DVT prophylaxis: SCDs Code Status: Full Family Communication:  Disposition Plan:  . Patient came from: home            . Anticipated d/c place: We will discharge home after thoracentesis tomorrow. . Barriers to d/c OR conditions which need to be met to effect a safe d/c:   Consultants:   None  Procedures: Thoracentesis scheduled. Antimicrobials: None  Subjective: Short of breath much improved today.  Oxygen for comfort.  No cough. No fever chills. No nausea vomiting.  No diarrhea constipation.  Objective: Vitals:   06/02/20 0025 06/02/20 0625 06/02/20 0758 06/02/20 0900  BP: 103/79 139/81 135/69   Pulse: 87 64 (!) 59   Resp: _0 Temp: 98.1 F (36.7 C) 97.6 F (36.4 C) (!) 97.4 F (36.3 C)   TempSrc: Oral  Oral   SpO2: 97% 100%  98%  Weight:      Height:        Intake/Output Summary (Last 24 hours) at 06/02/2020 1015 Last data filed at 06/02/2020 1012 Gross per 24 hour  Intake 51.91 ml  Output 500 ml  Net -448.09 ml   Filed Weights   06/01/20 1114 06/01/20 1638  Weight: 89.8 kg 85.5 kg    Examination:  General exam: Appears calm and comfortable  Respiratory system: Dull to percussion and a decreased breathing sound on right lower field.  Some crackles in the base. Respiratory effort normal. Cardiovascular system: Irregularly irregular.  No JVD, murmurs, rubs, gallops or clicks. 1+ pedal edema. Gastrointestinal system: Abdomen is nondistended, soft  and nontender. No organomegaly or masses felt. Normal bowel sounds heard. Central nervous system: Alert and oriented. No focal neurological deficits. Extremities: Symmetric  Skin: No rashes, lesions or ulcers Psychiatry: Judgement and insight appear normal. Mood & affect appropriate.     Data Reviewed: I have personally reviewed following labs and imaging studies  CBC: Recent Labs  Lab 06/01/20 1122  WBC 7.3  NEUTROABS 5.9  HGB 11.7*  HCT 36.0*  MCV 94.7  PLT 702   Basic  Metabolic Panel: Recent Labs  Lab 06/01/20 1122 06/01/20 1514 06/02/20 0739  NA 142  --  140  K 4.2  --  3.2*  CL 100  --  97*  CO2 28  --  31  GLUCOSE 113*  --  96  BUN 25*  --  26*  CREATININE 1.31*  --  1.48*  CALCIUM 8.9  --  8.9  MG  --  1.7  --    GFR: Estimated Creatinine Clearance: 44.5 mL/min (A) (by C-G formula based on SCr of 1.48 mg/dL (H)). Liver Function Tests: Recent Labs  Lab 06/01/20 1122 06/01/20 1514  AST 18  --   ALT 10  --   ALKPHOS 82  --   BILITOT 2.3* 2.1*  PROT 7.4  --   ALBUMIN 3.5  --    No results for input(s): LIPASE, AMYLASE in the last 168 hours. No results for input(s): AMMONIA in the last 168 hours. Coagulation Profile: Recent Labs  Lab 06/01/20 1122  INR 1.1   Cardiac Enzymes: No results for input(s): CKTOTAL, CKMB, CKMBINDEX, TROPONINI in the last 168 hours. BNP (last 3 results) No results for input(s): PROBNP in the last 8760 hours. HbA1C: Recent Labs    06/01/20 1636  HGBA1C 8.0*   CBG: Recent Labs  Lab 06/01/20 1651 06/01/20 2048 06/02/20 0019 06/02/20 0432 06/02/20 0759  GLUCAP 122* 163* 123* 99 88   Lipid Profile: Recent Labs    06/02/20 0739  CHOL 108  HDL 45  LDLCALC 50  TRIG 65  CHOLHDL 2.4   Thyroid Function Tests: No results for input(s): TSH, T4TOTAL, FREET4, T3FREE, THYROIDAB in the last 72 hours. Anemia Panel: No results for input(s): VITAMINB12, FOLATE, FERRITIN, TIBC, IRON, RETICCTPCT in the last 72 hours. Sepsis Labs: Recent Labs  Lab 06/01/20 1122 06/02/20 0739  PROCALCITON <0.10 <0.10    Recent Results (from the past 240 hour(s))  SARS Coronavirus 2 by RT PCR (hospital order, performed in [facility] hospital lab) Nasopharyngeal Nasopharyngeal Swab     Status: None   Collection Time: 06/01/20 12:23 PM   Specimen: Nasopharyngeal Swab  Result Value Ref Range Status   SARS Coronavirus 2 NEGATIVE NEGATIVE Final    Comment: (NOTE) SARS-CoV-2 target nucleic acids are NOT  DETECTED.  The SARS-CoV-2 RNA is generally detectable in upper and lower respiratory specimens during the acute phase of infection. The lowest concentration of SARS-CoV-2 viral copies this assay can detect is 250 copies / mL. A negative result does not preclude SARS-CoV-2 infection and should not be used as the sole basis for treatment or other patient management decisions.  A negative result may occur with improper specimen collection / handling, submission of specimen other than nasopharyngeal swab, presence of viral mutation(s) within the areas targeted by this assay, and inadequate number of viral copies (<250 copies / mL). A negative result must be combined with clinical observations, patient history, and epidemiological information.  Fact Sheet for Patients:   StrictlyIdeas.no  Fact Sheet for  Healthcare Providers: BankingDealers.co.za  This test is not yet approved or  cleared by the [place] and has been authorized for detection and/or diagnosis of SARS-CoV-2 by FDA under an Emergency Use Authorization (EUA).  This EUA will remain in effect (meaning this test can be used) for the duration of the COVID-19 declaration under Section 564(b)(1) of the Act, 21 U.S.C. section 360bbb-3(b)(1), unless the authorization is terminated or revoked sooner.  Performed at [facility], [address], [place], [place] [address]          Radiology Studies: DG Chest Portable 1 View  Result Date: 06/01/2020 CLINICAL DATA:  Shortness of breath.  Arrived via EMS. EXAM: PORTABLE CHEST 1 VIEW COMPARISON:  04/07/2020 FINDINGS: Reaccumulation of pleural fluid in the RIGHT chest with moderately large RIGHT pleural effusion tracking into the mid RIGHT chest. Dense opacification throughout the entire lower half of the RIGHT chest. Cardiomediastinal contours stable enlarged following median sternotomy where they can be assessed.  Obscured along the RIGHT heart border. Dense opacification at the RIGHT lung base may be due to volume loss, infection is difficult to exclude. Blunting also the LEFT costodiaphragmatic sulcus. Trachea is midline. No acute skeletal process to the extent evaluated. IMPRESSION: 1. Reaccumulation of pleural fluid in the RIGHT chest with moderately large RIGHT pleural effusion tracking into the mid RIGHT chest. 2. Small LEFT-sided effusion. 3. Dense opacification at the RIGHT lung base does not allow for the ability to exclude infection. Would suggest correlation with any clinical or laboratory evidence of heart failure based on appearance of the chest. 4. Dense opacification throughout the entire lower half of the RIGHT chest. Again on the basis of plain film radiography the possibility of underlying lesion is not excluded. Electronically Signed   By: Zetta Bills M.D.   On: 06/01/2020 11:50   DG Shoulder Left  Result Date: 06/01/2020 CLINICAL DATA:  LEFT-sided neck pain and pain radiating to the scapula for 3-4 days EXAM: LEFT SHOULDER - 2+ VIEW COMPARISON:  None FINDINGS: There is no evidence of fracture or dislocation. Mild acromioclavicular degenerative changes. Mild glenohumeral degenerative changes. Soft tissues are unremarkable. IMPRESSION: Mild degenerative changes in the left shoulder. No acute abnormality. Electronically Signed   By: Zetta Bills M.D.   On: 06/01/2020 19:04        Scheduled Meds: . aspirin EC  81 mg Oral Daily  . atorvastatin  80 mg Oral Daily  . digoxin  125 mcg Oral Daily  . fluticasone  2 spray Each Nare Daily  . furosemide  40 mg Intravenous Q12H  . gabapentin  100 mg Oral Q supper  . gabapentin  300 mg Oral QHS  . insulin aspart  0-9 Units Subcutaneous Q4H  . insulin glargine  5 Units Subcutaneous QHS  . irbesartan  300 mg Oral Daily  . loratadine  10 mg Oral Daily  . metoprolol succinate  25 mg Oral Daily  . potassium chloride  40 mEq Oral Once  .  senna-docusate  2 tablet Oral QHS  . sodium chloride flush  3 mL Intravenous Q12H  . tamsulosin  0.4 mg Oral Daily   Continuous Infusions: . sodium chloride    . methocarbamol (ROBAXIN) IV Stopped (06/02/20 0317)     LOS: 1 day    Time spent: 28 minutes    Sharen Hones, MD [facility]   To contact the attending provider between 7A-7P or the covering provider during after hours 7P-7A, please log into the web  site [email or web address] and access using universal [place] password for that web site. If you do not have the password, please call the hospital operator.  06/02/2020, 10:15 AM

## 2020-06-02 NOTE — Progress Notes (Signed)
Ch visited with Pt in response to an OR for AD. Pt took the AD packet and said he will look it over and complete it later. Pt asked Ch to close blinds to make room darker. Ch closed blinds. Pt did not want prayer at this time.

## 2020-06-02 NOTE — Evaluation (Signed)
Physical Therapy Evaluation Patient Details Name: Jeremiah Chapman MRN: [id number] DOB: [birth year] Today's Date: 06/02/2020   History of Present Illness  Mr. Richart is a 74 y/o gentleman who was admitted for acute on chronic CHF. Chief complaints include SOB x 3 days that is progressively worsening, worsening BLE edema, and L shoulder pain x 4 days. PMH icludes: HTN, hyperlipidemia, DM, CHF with EF 30-35%, CAD, CABG, A-fib on Eliquis, CKD III, and gout.  Clinical Impression  Upon arrival to room, pt seated edge of bed finishing breakfast. Pt agreeable to PT evaluation. Pt required CGA for sit <> stand transfers from bed and ambulated 60 feet using RW with CGA for safety on 4L O2 via nasal cannula. Pt with decreased strength overall. Pt initially with no c/o pain in L shoulder however as session progressed, pain began and remained at 5/10. Pt could benefit from continued skilled PT to address aforementioned deficits and optimize return to PLOF of ambulating using rollator with no physical assistance. Pt seated in recliner chair at end of session with phone/call bell/remote within arm's reach. No green box for chair alarm in room. Nurse and unit secretary notified of this and pt's positioning in chair. Pt notified to call for assistance to get up out of chair/move about for safety.  Seated EOB:  93% on 4L O2 Standing: 98% on 4L O2 Seated after ambulation: 92% on 4L End of session: 100% on 3L O2    Follow Up Recommendations Home health PT    Equipment Recommendations  3in1 (PT)    Recommendations for Other Services       Precautions / Restrictions Precautions Precautions: Fall Restrictions Weight Bearing Restrictions: No      Mobility  Bed Mobility               General bed mobility comments: Pt seated edge of bed eating breakfast therefore bed mobility no performed.  Transfers Overall transfer level: Needs assistance Equipment used: Rolling walker (2 wheeled) Transfers: Sit  to/from Stand Sit to Stand: Min guard         General transfer comment: Pt required two attempts to perform and was able to come into upright standing with CGA for steadying. Verbal cues for hand placement.  Ambulation/Gait Ambulation/Gait assistance: Min guard Gait Distance (Feet): 60 Feet Assistive device: Rolling walker (2 wheeled) Gait Pattern/deviations: Step-through pattern Gait velocity: decreased   General Gait Details: Pt ambulated with slightly decreased but steady gait speed. Noted reciprocal gait pattern and no LOB with CGA.  Stairs            Wheelchair Mobility    Modified Rankin (Stroke Patients Only)       Balance Overall balance assessment: Needs assistance Sitting-balance support: Feet supported Sitting balance-Leahy Scale: Good Sitting balance - Comments: no overt LOB noted when seated unsupported edge of bed   Standing balance support: Bilateral upper extremity supported Standing balance-Leahy Scale: Good Standing balance comment: CGA for standing balance for safety with BUE support on RW                             Pertinent Vitals/Pain Pain Assessment: 0-10 Pain Score: 5  Pain Location: L shoulder Pain Descriptors / Indicators: Discomfort Pain Intervention(s): Monitored during session;Limited activity within patient's tolerance;Repositioned    Home Living Family/patient expects to be discharged to:: Private residence Living Arrangements: Alone Available Help at Discharge: Family;Available PRN/intermittently;Other (Comment) (sister will come when he is d/c)  Type of Home: Independent living facility Home Access: Level entry     Home Layout: One level Home Equipment: Walker - 2 wheels;Walker - 4 wheels;Grab bars - tub/shower Additional Comments: Pt reports that he lives at "the [facility]" which he moved into just recently. Pt uses 2L O2 chronically.    Prior Function Level of Independence: Independent with assistive device(s)          Comments: Pt reports utilizing rollator as it gives him stability and allows him to carry his O2. He states that he is able to perform ADLs independently and required no assistance for bed mobility or transfers. Pt reports ability to ambulate into and out of stores for shopping.     Hand Dominance        Extremity/Trunk Assessment   Upper Extremity Assessment Upper Extremity Assessment: Generalized weakness (LUE 3+/5 overall, RUE 4-/5 overall)    Lower Extremity Assessment Lower Extremity Assessment: Generalized weakness (overall 4-/5)       Communication   Communication: No difficulties  Cognition Arousal/Alertness: Awake/alert Behavior During Therapy: WFL for tasks assessed/performed Overall Cognitive Status: Within Functional Limits for tasks assessed                                        General Comments General comments (skin integrity, edema, etc.): BLE with pitting edema    Exercises Other Exercises Other Exercises: pt performed heel slides and alternating seated marches x 10 bilat; SLR x 5 reps bilaterally, 2 sets   Assessment/Plan    PT Assessment Patient needs continued PT services  PT Problem List Decreased strength;Decreased balance;Pain       PT Treatment Interventions DME instruction;Gait training;Functional mobility training;Therapeutic activities;Therapeutic exercise;Balance training;Patient/family education    PT Goals (Current goals can be found in the Care Plan section)  Acute Rehab PT Goals Patient Stated Goal: to go home PT Goal Formulation: With patient Time For Goal Achievement: 06/16/20 Potential to Achieve Goals: Good    Frequency Min 2X/week   Barriers to discharge        Co-evaluation               AM-PAC PT "6 Clicks" Mobility  Outcome Measure Help needed turning from your back to your side while in a flat bed without using bedrails?: None Help needed moving from lying on your back to sitting  on the side of a flat bed without using bedrails?: None Help needed moving to and from a bed to a chair (including a wheelchair)?: A Little Help needed standing up from a chair using your arms (e.g., wheelchair or bedside chair)?: A Little Help needed to walk in hospital room?: A Little Help needed climbing 3-5 steps with a railing? : A Little 6 Click Score: 20    End of Session Equipment Utilized During Treatment: Gait belt Activity Tolerance: Patient tolerated treatment well;Patient limited by pain Patient left: in chair;with call bell/phone within reach Nurse Communication: Mobility status;Other (comment) (no chair alarm in room; weaned to 3L O2 with stable vitals) PT Visit Diagnosis: Unsteadiness on feet (R26.81);Muscle weakness (generalized) (M62.81);Pain Pain - Right/Left: Left Pain - part of body: Shoulder    Time: 1660-6301 PT Time Calculation (min) (ACUTE ONLY): 43 min   Charges:              Frederich Chick, SPT  Frederich Chick 06/02/2020, 12:08 PM

## 2020-06-02 NOTE — Progress Notes (Signed)
Initial Nutrition Assessment  DOCUMENTATION CODES:   Not applicable  INTERVENTION:   Ensure Enlive po BID, each supplement provides 350 kcal and 20 grams of protein  MVI daily   Low sodium diet education   NUTRITION DIAGNOSIS:   Inadequate oral intake related to acute illness as evidenced by meal completion < 50%.  GOAL:   Patient will meet greater than or equal to 90% of their needs  MONITOR:   PO intake, Supplement acceptance, Labs, Weight trends, Skin, I & O's  REASON FOR ASSESSMENT:   Malnutrition Screening Tool    ASSESSMENT:   74 y.o. male with medical history significant of hypertension, hyperlipidemia, diabetes mellitus, CHF with EF 30-35%, CAD, CABG, atrial fibrillation on Eliquis, CKD III and gout who presents with CHF   Met with pt in room today. Pt reports poor appetite and oral intake for the past six months r/t a heart attack he had in November. Pt reports that he has been losing weight. Pt's UBW is ~210lbs. Per chart, pt has lost 19lbs(9%) since 10/2019; this is not significant. Pt reports that his appetite remains poor in hospital; pt eating ~50% of his meals. Pt is willing to try chocolate or strawberry supplements in hospital; RD will order. Of note, pt is lactose intolerant and does not like oatmeal. Pt does drink lactaid. Pt provided with low sodium diet education today.   RD provided "Low Sodium Nutrition Therapy" handout from the Academy of [employer]etics. Reviewed patient's dietary recall. Provided examples on ways to decrease sodium intake in diet. Discouraged intake of processed foods and use of salt shaker. Encouraged fresh fruits and vegetables as well as whole grain sources of carbohydrates to maximize fiber intake.   RD discussed why it is important for patient to adhere to diet recommendations, and emphasized the role of fluids, foods to avoid, and importance of weighing self daily. Teach back method used.  Medications reviewed and  include: aspirin, lasix, insulin, KCl, senokot  Labs reviewed: K 3.2(L), BUN 26(H), creat 1.48(H)  NUTRITION - FOCUSED PHYSICAL EXAM:    Most Recent Value  Orbital Region No depletion  Upper Arm Region Mild depletion  Thoracic and Lumbar Region Mild depletion  Buccal Region Mild depletion  Temple Region Mild depletion  Clavicle Bone Region Mild depletion  Clavicle and Acromion Bone Region Mild depletion  Scapular Bone Region No depletion  Dorsal Hand Mild depletion  Patellar Region Mild depletion  Anterior Thigh Region Mild depletion  Posterior Calf Region Mild depletion  Edema (RD Assessment) Mild  Hair Reviewed  Eyes Reviewed  Mouth Reviewed  Skin Reviewed  Nails Reviewed     Diet Order:   Diet Order            Diet heart healthy/carb modified Room service appropriate? Yes; Fluid consistency: Thin  Diet effective now                EDUCATION NEEDS:   Education needs have been addressed  Skin:  Skin Assessment: Reviewed RN Assessment (ecchymosis)  Last BM:  7/3  Height:   Ht Readings from Last 1 Encounters:  06/01/20 '5\' 10"'$  (1.778 m)    Weight:   Wt Readings from Last 1 Encounters:  06/01/20 85.5 kg    Ideal Body Weight:  75.45 kg  BMI:  Body mass index is 27.06 kg/m.  Estimated Nutritional Needs:   Kcal:  2100-2400kcal/day  Protein:  105-120g/day  Fluid:  1.9L/day  Koleen Distance MS, RD, LDN Please refer to  AMION for RD and/or RD on-call/weekend/after hours pager

## 2020-06-02 NOTE — Progress Notes (Signed)
Per Dr. Chipper Herb hold scheduled dose of eliquis, patient may need thoracentesis.

## 2020-06-02 NOTE — Progress Notes (Addendum)
Cross Cover Brief Note Patient with increased neck and should pain requiring IV narcotic pain med during night.  Added robaxin to therapy.  Suspect radiculopathy and cervical thoracic AP lateral radiographs ordered. May also need follow up MRI given recurrent pleural effusions can be concerning for cancer

## 2020-06-02 NOTE — Progress Notes (Signed)
Pt verbalized annoyance of having his blood sugar checked so often. He also stated he is getting no rest and is very tired.  Last few bg's assessed.  NP on called notified and BG's changed to AC/HS at patient request.

## 2020-06-02 NOTE — Progress Notes (Addendum)
OT Cancellation Note  Patient Details Name: Jeremiah Chapman MRN: [id number] DOB: 03/04/1946   Cancelled Treatment:    Reason Eval/Treat Not Completed: Fatigue/lethargy limiting ability to participate. Upon 2nd attempt, pt sleeping soundly. Wakes to therapist's voice. Politely declines citing significant fatigue. Pt requests additional time to rest prior to OT evaluation. Will re-attempt at later time as schedule permits.   Addendum, 3:10pm: Pt again declining OT evaluation 2/2 fatigue and neck pain. Pt requesting OT to inform RN of pain. RN notified. Pt agreeable to OT re-attempting next date.  Richrd Prime, MPH, MS, OTR/L ascom [phone] 06/02/20, 1:58 PM

## 2020-06-02 NOTE — TOC Initial Note (Signed)
Transition of Care [facility]) - Initial/Assessment Note    Patient Details  Name: REFUGIO VANDEVOORDE MRN: [id number] Date of Birth: 1946-01-26  Transition of Care [facility]) CM/SW Contact:    Shawn Route, RN Phone Number: 06/02/2020, 8:44 AM  Clinical Narrative:                  Mr. Fesler is a resident at [facility], he has a son and family for support.  He chronically wears 2LNC.  Currently waiting on PT/OT eval for possible needs.    Expected Discharge Plan: Home w Home Health Services Barriers to Discharge: Continued Medical Work up   Patient Goals and CMS Choice Patient states their goals for this hospitalization and ongoing recovery are:: to return to ALF CMS Medicare.gov Compare Post Acute Care list provided to:: Patient Choice offered to / list presented to : Patient  Expected Discharge Plan and Services Expected Discharge Plan: Home w Home Health Services   Discharge Planning Services: CM Consult   Living arrangements for the past 2 months: Assisted Living Facility                                      Prior Living Arrangements/Services Living arrangements for the past 2 months: Assisted Living Facility Lives with:: Facility Resident Patient language and need for interpreter reviewed:: Yes Do you feel safe going back to the place where you live?: Yes      Need for Family Participation in Patient Care: No (Comment) Care giver support system in place?: Yes (comment)   Criminal Activity/Legal Involvement Pertinent to Current Situation/Hospitalization: No - Comment as needed  Activities of Daily Living Home Assistive Devices/Equipment: Eyeglasses, Oxygen, CBG Meter, Walker (specify type) ADL Screening (condition at time of admission) Patient's cognitive ability adequate to safely complete daily activities?: Yes Is the patient deaf or have difficulty hearing?: No Does the patient have difficulty seeing, even when wearing glasses/contacts?: Yes Does the  patient have difficulty concentrating, remembering, or making decisions?: No Patient able to express need for assistance with ADLs?: Yes Does the patient have difficulty dressing or bathing?: No Independently performs ADLs?: Yes (appropriate for developmental age) Does the patient have difficulty walking or climbing stairs?: Yes Weakness of Legs: Left Weakness of Arms/Hands: None  Permission Sought/Granted                  Emotional Assessment       Orientation: : Oriented to Self, Oriented to Place, Oriented to  Time, Oriented to Situation Alcohol / Substance Use: Not Applicable Psych Involvement: No (comment)  Admission diagnosis:  Pleural effusion [J90] Acute respiratory failure with hypoxia (HCC) [J96.01] Acute on chronic heart failure, unspecified heart failure type (HCC) [I50.9] Acute on chronic systolic (congestive) heart failure (HCC) [I50.23] Patient Active Problem List   Diagnosis Date Noted  . Pleural effusion 06/01/2020  . Acute on chronic systolic (congestive) heart failure (HCC) 06/01/2020  . Left shoulder pain 06/01/2020  . Hypoglycemia 10/29/2019  . Sepsis (HCC) 10/29/2019  . Acute on chronic systolic CHF (congestive heart failure) (HCC) 10/29/2019  . NSTEMI (non-ST elevated myocardial infarction) (HCC) 10/29/2019  . Acute on chronic diastolic CHF (congestive heart failure) (HCC) 05/26/2019  . PNA (pneumonia) 04/21/2017  . S/P cardiac catheterization 04/05/2017  . Bradycardia 03/26/2017  . Elevated troponin 03/26/2017  . CKD (chronic kidney disease), stage IIIa 03/26/2017  . SOB (shortness of breath)  04/15/2016  . CAD (coronary artery disease) 10/21/2015  . Hyperlipidemia 02/12/2015  . Paresthesia of foot, bilateral 02/12/2015  . Hyperlipidemia due to type 2 diabetes mellitus (HCC) 02/12/2015  . H/O adenomatous polyp of colon 01/08/2015  . Essential hypertension 10/14/2014  . Type II diabetes mellitus with renal manifestations (HCC) 10/14/2014  . S/P  CABG x 2 06/13/2014  . Atrial fibrillation (HCC) 06/07/2014  . Gout 06/07/2014   PCP:  Dorothey Baseman, MD Pharmacy:   [facility] [address] [place], [place] - [address] [place] [address] Phone: [phone] Fax: [phone]  [id number] [place], [place] - [address] AT [facility] [address] [place] [address] [place] [address] Phone: [phone] Fax: [phone]     Social Determinants of Health (SDOH) Interventions    Readmission Risk Interventions No flowsheet data found.

## 2020-06-03 ENCOUNTER — Other Ambulatory Visit: Payer: Self-pay

## 2020-06-03 ENCOUNTER — Inpatient Hospital Stay: Payer: Medicare Other

## 2020-06-03 ENCOUNTER — Inpatient Hospital Stay
Admit: 2020-06-03 | Discharge: 2020-06-03 | Disposition: A | Discharge status: Home / Self Care | Payer: Medicare Other | Attending: Internal Medicine | Admitting: Internal Medicine

## 2020-06-03 LAB — BASIC METABOLIC PANEL
Anion gap: 13 (ref 5–15)
BUN: 28 mg/dL — ABNORMAL HIGH (ref 8–23)
CO2: 33 mmol/L — ABNORMAL HIGH (ref 22–32)
Calcium: 9.1 mg/dL (ref 8.9–10.3)
Chloride: 94 mmol/L — ABNORMAL LOW (ref 98–111)
Creatinine, Ser: 1.54 mg/dL — ABNORMAL HIGH (ref 0.61–1.24)
GFR calc Af Amer: 50 mL/min — ABNORMAL LOW (ref >60)
GFR calc non Af Amer: 43 mL/min — ABNORMAL LOW (ref >60)
Glucose, Bld: 146 mg/dL — ABNORMAL HIGH (ref 70–99)
Potassium: 4.4 mmol/L (ref 3.5–5.1)
Sodium: 140 mmol/L (ref 135–145)

## 2020-06-03 LAB — GLUCOSE, CAPILLARY
Glucose-Capillary: 128 mg/dL — ABNORMAL HIGH (ref 70–99)
Glucose-Capillary: 180 mg/dL — ABNORMAL HIGH (ref 70–99)

## 2020-06-03 LAB — MAGNESIUM: Magnesium: 1.7 mg/dL (ref 1.7–2.4)

## 2020-06-03 LAB — ECHOCARDIOGRAM COMPLETE
Height: 70 in
Weight: 3001.6 oz

## 2020-06-03 LAB — PROTEIN, PLEURAL OR PERITONEAL FLUID: Total protein, fluid: <3 g/dL

## 2020-06-03 LAB — LACTATE DEHYDROGENASE, PLEURAL OR PERITONEAL FLUID: LD, Fluid: 70 U/L — ABNORMAL HIGH (ref 3–23)

## 2020-06-03 LAB — BODY FLUID CELL COUNT WITH DIFFERENTIAL
Eos, Fluid: 0 %
Lymphs, Fluid: 79 %
Monocyte-Macrophage-Serous Fluid: 20 %
Neutrophil Count, Fluid: 1 %
Total Nucleated Cell Count, Fluid: 313 cu mm

## 2020-06-03 MED ORDER — POTASSIUM CHLORIDE ER 10 MEQ PO TBCR: 10.0000 meq | EXTENDED_RELEASE_TABLET | Freq: Every day | ORAL | 0 refills | Status: AC

## 2020-06-03 MED ORDER — FUROSEMIDE 20 MG PO TABS: 40.0000 mg | ORAL_TABLET | Freq: Two times a day (BID) | ORAL | 0 refills | Status: AC

## 2020-06-03 NOTE — Discharge Summary (Addendum)
Physician Discharge Summary  Patient ID: Jeremiah Chapman MRN: [id number] DOB/AGE: 1946-07-16 74 y.o.  Admit date: 06/01/2020 Discharge date: 06/03/2020  Admission Diagnoses:  Discharge Diagnoses:  Principal Problem:   Acute on chronic systolic CHF (congestive heart failure) (HCC) Active Problems:   Atrial fibrillation (HCC)   CAD (coronary artery disease)   Essential hypertension   Hyperlipidemia   Type II diabetes mellitus with renal manifestations (HCC)   Elevated troponin   CKD (chronic kidney disease), stage IIIa   Pleural effusion   Acute on chronic systolic (congestive) heart failure (HCC)   Left shoulder pain   Discharged Condition: good  Hospital Course:  Jeremiah Charland Hepleris a 74 y.o.malewith medical history significant ofhypertension, hyperlipidemia, diabetes mellitus, CHF with EF 30-35%, CAD, CABG, atrial fibrillation on Eliquis, CKD three, gout, who presents with shortness of breath. He had a elevated BNP at 1909, mild elevation troponin 56, chest x-ray showed moderate amount of pleural effusion on the right side.  There is some opacity in the right side.  Procalcitonin level less than 0.1.  Patient is started on IV Lasix.  7/6.  Patient had a thoracentesis, removed 1.9 L of fluid.  Short of breath much improved.  Medically stable to be discharged.  #1.  Acute on chronic systolic congestive heart failure.   Ejection fraction 30 to 35%.  Patient has evidence of volume overload.    He received IV Lasix.  Condition had improved, will continue increased dose of Lasix to 40 mg twice a day.  Added potassium supplement daily.  2.  Right-sided pleural effusion. Most likely due to congestive heart failure.  No evidence of infection or pneumonia.  Procalcitonin level less than 0.1.    This appears to be recurrent.  Thoracentesis was performed, removed 1.9 L of fluids.  Patient need to follow-up with PCP in the near future, may need repeated thoracentesis.  3.   Hypokalemia. Secondary to diuretics.    Added oral supplement at discharge.  4.  Permanent  atrial fibrillation. Resume anticoagulation on discharge.  5.  Essential hypertension. Continue home medicines.  6.  Type 2 diabetes. Relatively controlled.    Resume home medicines.  Consults: None  Significant Diagnostic Studies:  PORTABLE CHEST 1 VIEW  COMPARISON:  04/07/2020  FINDINGS: Reaccumulation of pleural fluid in the RIGHT chest with moderately large RIGHT pleural effusion tracking into the mid RIGHT chest. Dense opacification throughout the entire lower half of the RIGHT chest.  Cardiomediastinal contours stable enlarged following median sternotomy where they can be assessed. Obscured along the RIGHT heart border. Dense opacification at the RIGHT lung base may be due to volume loss, infection is difficult to exclude.  Blunting also the LEFT costodiaphragmatic sulcus.  Trachea is midline.  No acute skeletal process to the extent evaluated.  IMPRESSION: 1. Reaccumulation of pleural fluid in the RIGHT chest with moderately large RIGHT pleural effusion tracking into the mid RIGHT chest. 2. Small LEFT-sided effusion. 3. Dense opacification at the RIGHT lung base does not allow for the ability to exclude infection. Would suggest correlation with any clinical or laboratory evidence of heart failure based on appearance of the chest. 4. Dense opacification throughout the entire lower half of the RIGHT chest. Again on the basis of plain film radiography the possibility of underlying lesion is not excluded.   Electronically Signed   By: Donzetta Kohut M.D.   On: 06/01/2020 11:50  CERVICAL SPINE - 2-3 VIEW  COMPARISON:  None.  FINDINGS: There is no fracture.  Facet degenerative disease results in 0.3 cm anterolisthesis C4 on C5 and 0.6 cm anterolisthesis C6 on C7. Loss of disc space height and endplate spurring are seen at C5-6. Prevertebral soft  tissues are normal. Lung apices are clear.  IMPRESSION: No acute abnormality.  Degenerative disc disease C5-6.  Multilevel facet arthropathy resulting in anterolisthesis C4 on C5 and C6 on C7.   Electronically Signed   By: Drusilla Kanner M.D.   On: 06/02/2020 13:05  THORACIC SPINE 2 VIEWS  COMPARISON:  CT chest 10/29/2019. Single-view of the chest 06/01/2020.  FINDINGS: There is no fracture or malalignment. Multilevel anterior endplate spurring and DISH are stable in appearance. Paraspinous structures demonstrate right larger than left pleural effusions which are better seen on recent chest film.  IMPRESSION: No acute abnormality. No change in the appearance of cervical spondylosis and DISH since 10/29/2019.   Electronically Signed   By: Drusilla Kanner M.D.   On: 06/02/2020 13:04  US THORACENTESIS ASP PLEURAL SPACE W/IMG GUIDE  COMPARISON:  Chest radiograph-06/01/2020; ultrasound-guided right-sided thoracentesis-04/07/2020 (yielding 2 L of pleural fluid)  MEDICATIONS: None.  COMPLICATIONS: None immediate.  TECHNIQUE: Informed written consent was obtained from the patient after a discussion of the risks, benefits and alternatives to treatment. A timeout was performed prior to the initiation of the procedure.  Initial ultrasound scanning demonstrates a large anechoic right-sided pleural effusion. The lower chest was prepped and draped in the usual sterile fashion. 1% lidocaine was used for local anesthesia. An ultrasound image was saved for documentation purposes. An 8 Fr Safe-T-Centesis catheter was introduced. The thoracentesis was performed. The catheter was removed and a dressing was applied. The patient tolerated the procedure well without immediate post procedural complication. The patient was escorted to have an upright chest radiograph.  FINDINGS: A total of approximately 1.95 liters of serous fluid was removed. Requested  samples were sent to the laboratory.  IMPRESSION: Successful ultrasound-guided right sided thoracentesis yielding 1.95 liters of pleural fluid.   Electronically Signed   By: Simonne Come M.D.   On: 06/03/2020 14:08 PORTABLE CHEST 1 VIEW  COMPARISON:  06/01/2020; 04/07/2020; chest CT-10/29/2019  FINDINGS: Grossly unchanged enlarged cardiac silhouette and mediastinal contours post median sternotomy. Redemonstrated fracture of the two most superior sternotomy wires.  Interval reduction in persistent small likely partially loculated right-sided effusion post thoracentesis. Improved aeration of the right lung base with persistent right basilar heterogeneous/consolidative opacities. The left lung remains well aerated. No new focal airspace opacities. No left-sided pleural effusion. Pulmonary venous congestion without frank evidence of edema. No acute osseous abnormalities.  IMPRESSION: 1. Interval reduction in persistent small likely partially loculated right-sided effusion post thoracentesis. 2. Improved aeration the right lung base with persistent right basilar heterogeneous/consolidative opacities, atelectasis versus infiltrate.   Electronically Signed   By: Simonne Come M.D.   On: 06/03/2020 14:07  Treatments: IV Lasix and a thoracentesis.  Discharge Exam: Blood pressure (!) 101/59, pulse 97, temperature (!) 97.5 F (36.4 C), temperature source Oral, resp. rate 20, height 5\' 10"  (1.778 m), weight 85.1 kg, SpO2 (!) 59 %. General appearance: alert and cooperative Resp: Decreased breathing sounds on the right side. Cardio: regular rate and rhythm, S1, S2 normal, no murmur, click, rub or gallop and Irregular, no murmurs. GI: soft, non-tender; bowel sounds normal; no masses,  no organomegaly Extremities: 1+ edema.  Disposition: Discharge disposition: 01-Home or Self Care       Discharge Instructions    Diet - low sodium heart healthy   Complete  by: As  directed    Increase activity slowly   Complete by: As directed      Allergies as of 06/03/2020      Reactions   Lactose Intolerance (gi)    Sitagliptin Rash, Other (See Comments)   Januvia      Medication List    STOP taking these medications   glimepiride 4 MG tablet Commonly known as: AMARYL     TAKE these medications   albuterol 108 (90 Base) MCG/ACT inhaler Commonly known as: VENTOLIN HFA Inhale 2 puffs into the lungs every 6 (six) hours as needed.   apixaban 5 MG Tabs tablet Commonly known as: ELIQUIS Take 5 mg by mouth every 12 (twelve) hours.   aspirin 81 MG EC tablet Take 81 mg by mouth daily.   atorvastatin 80 MG tablet Commonly known as: LIPITOR TAKE 1 TABLET BY MOUTH EVERY DAY   digoxin 0.125 MG tablet Commonly known as: LANOXIN TAKE 1 TABLET BY MOUTH EVERY DAY   fluticasone 50 MCG/ACT nasal spray Commonly known as: FLONASE Place into the nose. Place 2 sprays into both nostrils once daily   furosemide 20 MG tablet Commonly known as: LASIX Take 2 tablets (40 mg total) by mouth 2 (two) times daily. What changed: how much to take   gabapentin 100 MG capsule Commonly known as: NEURONTIN Take 100-300 mg by mouth See admin instructions. Take 1 capsule (100mg ) by mouth at dinnertime and take 3 capsules (300mg ) by mouth at bedtime   insulin degludec 200 UNIT/ML FlexTouch Pen Commonly known as: TRESIBA Inject 8 Units into the skin daily.   irbesartan 300 MG tablet Commonly known as: AVAPRO TAKE 1 TABLET BY MOUTH EVERY DAY   loratadine 10 MG dissolvable tablet Commonly known as: CLARITIN REDITABS Take by mouth.   metFORMIN 500 MG 24 hr tablet Commonly known as: GLUCOPHAGE-XR Take 1,000 mg by mouth 2 (two) times daily.   metoprolol succinate 25 MG 24 hr tablet Commonly known as: TOPROL-XL Take 25 mg by mouth daily.   polyethylene glycol 17 g packet Commonly known as: MIRALAX / GLYCOLAX Take 17 g by mouth daily.   potassium chloride 10 MEQ  tablet Commonly known as: KLOR-CON Take 1 tablet (10 mEq total) by mouth daily.   senna-docusate 8.6-50 MG tablet Commonly known as: Senokot-S Take 2 tablets by mouth at bedtime.   tamsulosin 0.4 MG Caps capsule Commonly known as: FLOMAX Take 0.4 mg by mouth daily. Take after the same meal each day.   triamcinolone cream 0.1 % Commonly known as: KENALOG Apply 1 application topically 2 (two) times daily as needed (skin irritations).       Follow-up Information    [facility] Follow up on 06/30/2020.   Specialty: Cardiology Why: at 1:30pm. Enter through the [facility] entrance Contact information: [address] [place] [address] [place] [phone]       [id number], MD Follow up in 1 week(s).   Specialty: Family Medicine Contact information: [address] [place] [address] [place] [phone]             35 minutes  Signed: [id number] 06/03/2020, 2:38 PM

## 2020-06-03 NOTE — Progress Notes (Signed)
REDS reader inoperable.  Marked fro biomed to fix.

## 2020-06-03 NOTE — Progress Notes (Signed)
*  PRELIMINARY RESULTS* Echocardiogram 2D Echocardiogram has been performed.  Jeremiah Chapman 06/03/2020, 12:55 PM

## 2020-06-03 NOTE — Progress Notes (Signed)
OT Cancellation Note  Patient Details Name: Jeremiah Chapman MRN: [id number] DOB: 10/03/1946   Cancelled Treatment:    Reason Eval/Treat Not Completed: Patient at procedure or test/ unavailable. Pt currently getting thoracentesis. Unavailable for OT evaluation. Will re-attempt at later date/time as pt is available and medically appropriate.   Richrd Prime, MPH, MS, OTR/L ascom [phone] 06/03/20, 1:34 PM

## 2020-06-03 NOTE — Discharge Instructions (Signed)
1.  Follow-up with PCP in 1 week. 2.  Follow-up with heart failure clinic as scheduled. 3.  Daily weight, call PCP if weight gain of 2 pounds. 4.  Fluid restriction less than 1.8 L, lower salt diet.

## 2020-06-03 NOTE — Progress Notes (Signed)
OT Cancellation Note  Patient Details Name: Jeremiah Chapman MRN: [id number] DOB: 08/29/1946   Cancelled Treatment:    Reason Eval/Treat Not Completed: Fatigue/lethargy limiting ability to participate. Pt sleeping soundly this am, does not wake to verbal cues. Will re-attempt a bit later this date for OT evaluation.   Richrd Prime, MPH, MS, OTR/L ascom [phone] 06/03/20, 9:26 AM

## 2020-06-03 NOTE — Progress Notes (Signed)
Jeremiah Chapman  A and O x 4. VSS. Pt tolerating diet well. No complaints of pain or nausea. IV removed intact, prescriptions given. Pt voiced understanding of discharge instructions with no further questions. Pt discharged via wheelchair.    Allergies as of 06/03/2020      Reactions   Lactose Intolerance (gi)    Sitagliptin Rash, Other (See Comments)   Januvia      Medication List    STOP taking these medications   glimepiride 4 MG tablet Commonly known as: AMARYL     TAKE these medications   albuterol 108 (90 Base) MCG/ACT inhaler Commonly known as: VENTOLIN HFA Inhale 2 puffs into the lungs every 6 (six) hours as needed.   apixaban 5 MG Tabs tablet Commonly known as: ELIQUIS Take 5 mg by mouth every 12 (twelve) hours.   aspirin 81 MG EC tablet Take 81 mg by mouth daily.   atorvastatin 80 MG tablet Commonly known as: LIPITOR TAKE 1 TABLET BY MOUTH EVERY DAY   digoxin 0.125 MG tablet Commonly known as: LANOXIN TAKE 1 TABLET BY MOUTH EVERY DAY   fluticasone 50 MCG/ACT nasal spray Commonly known as: FLONASE Place into the nose. Place 2 sprays into both nostrils once daily   furosemide 20 MG tablet Commonly known as: LASIX Take 2 tablets (40 mg total) by mouth 2 (two) times daily. What changed: how much to take   gabapentin 100 MG capsule Commonly known as: NEURONTIN Take 100-300 mg by mouth See admin instructions. Take 1 capsule (100mg ) by mouth at dinnertime and take 3 capsules (300mg ) by mouth at bedtime   insulin degludec 200 UNIT/ML FlexTouch Pen Commonly known as: TRESIBA Inject 8 Units into the skin daily.   irbesartan 300 MG tablet Commonly known as: AVAPRO TAKE 1 TABLET BY MOUTH EVERY DAY   loratadine 10 MG dissolvable tablet Commonly known as: CLARITIN REDITABS Take by mouth.   metFORMIN 500 MG 24 hr tablet Commonly known as: GLUCOPHAGE-XR Take 1,000 mg by mouth 2 (two) times daily.   metoprolol succinate 25 MG 24 hr tablet Commonly known as:  TOPROL-XL Take 25 mg by mouth daily.   polyethylene glycol 17 g packet Commonly known as: MIRALAX / GLYCOLAX Take 17 g by mouth daily.   potassium chloride 10 MEQ tablet Commonly known as: KLOR-CON Take 1 tablet (10 mEq total) by mouth daily.   senna-docusate 8.6-50 MG tablet Commonly known as: Senokot-S Take 2 tablets by mouth at bedtime.   tamsulosin 0.4 MG Caps capsule Commonly known as: FLOMAX Take 0.4 mg by mouth daily. Take after the same meal each day.   triamcinolone cream 0.1 % Commonly known as: KENALOG Apply 1 application topically 2 (two) times daily as needed (skin irritations).       Vitals:   06/03/20 1352 06/03/20 1551  BP: (!) 101/59 131/74  Pulse: 97 81  Resp:  18  Temp:  97.6 F (36.4 C)  SpO2: (!) 59% 97%    08/04/20

## 2020-06-03 NOTE — Progress Notes (Signed)
Warm compress applied to left shoulder.  Pain med takes pain from, 7-8/10 to 4-5/10.  Pt verbalizes that the warm compress feels good and is helping with his pain.

## 2020-06-03 NOTE — Procedures (Signed)
Pre procedural Dx: Symptomatic pleural effusion Post procedural Dx: Same  Successful US guided right sided thoracentesis yielding 1.95 L of serous pleural fluid.   Samples sent to lab for analysis.  EBL: None Complications: None immediate.  Katherina Right, MD Pager #: [phone]

## 2020-06-04 LAB — PROTEIN, BODY FLUID (OTHER): Total Protein, Body Fluid Other: 2.2 g/dL

## 2020-06-10 ENCOUNTER — Telehealth: Payer: Self-pay | Admitting: Family

## 2020-06-10 NOTE — Telephone Encounter (Signed)
Spoke to patient regarding his new CHF Clinic appointment that was made after his recent hospital discharge and patient confirmed appointment. He has no signs or symptoms at this time, is following a low sodium diet, staying active and checking his weight daily.    Peggi Yono, NT

## 2020-06-15 IMAGING — DX DG CHEST 1V
1 series · 1 of 1 positions shown · non-contrast
Comparison: October 30, 2019

CLINICAL DATA: Shortness of breath

EXAM:
CHEST  1 VIEW

[chest ap]
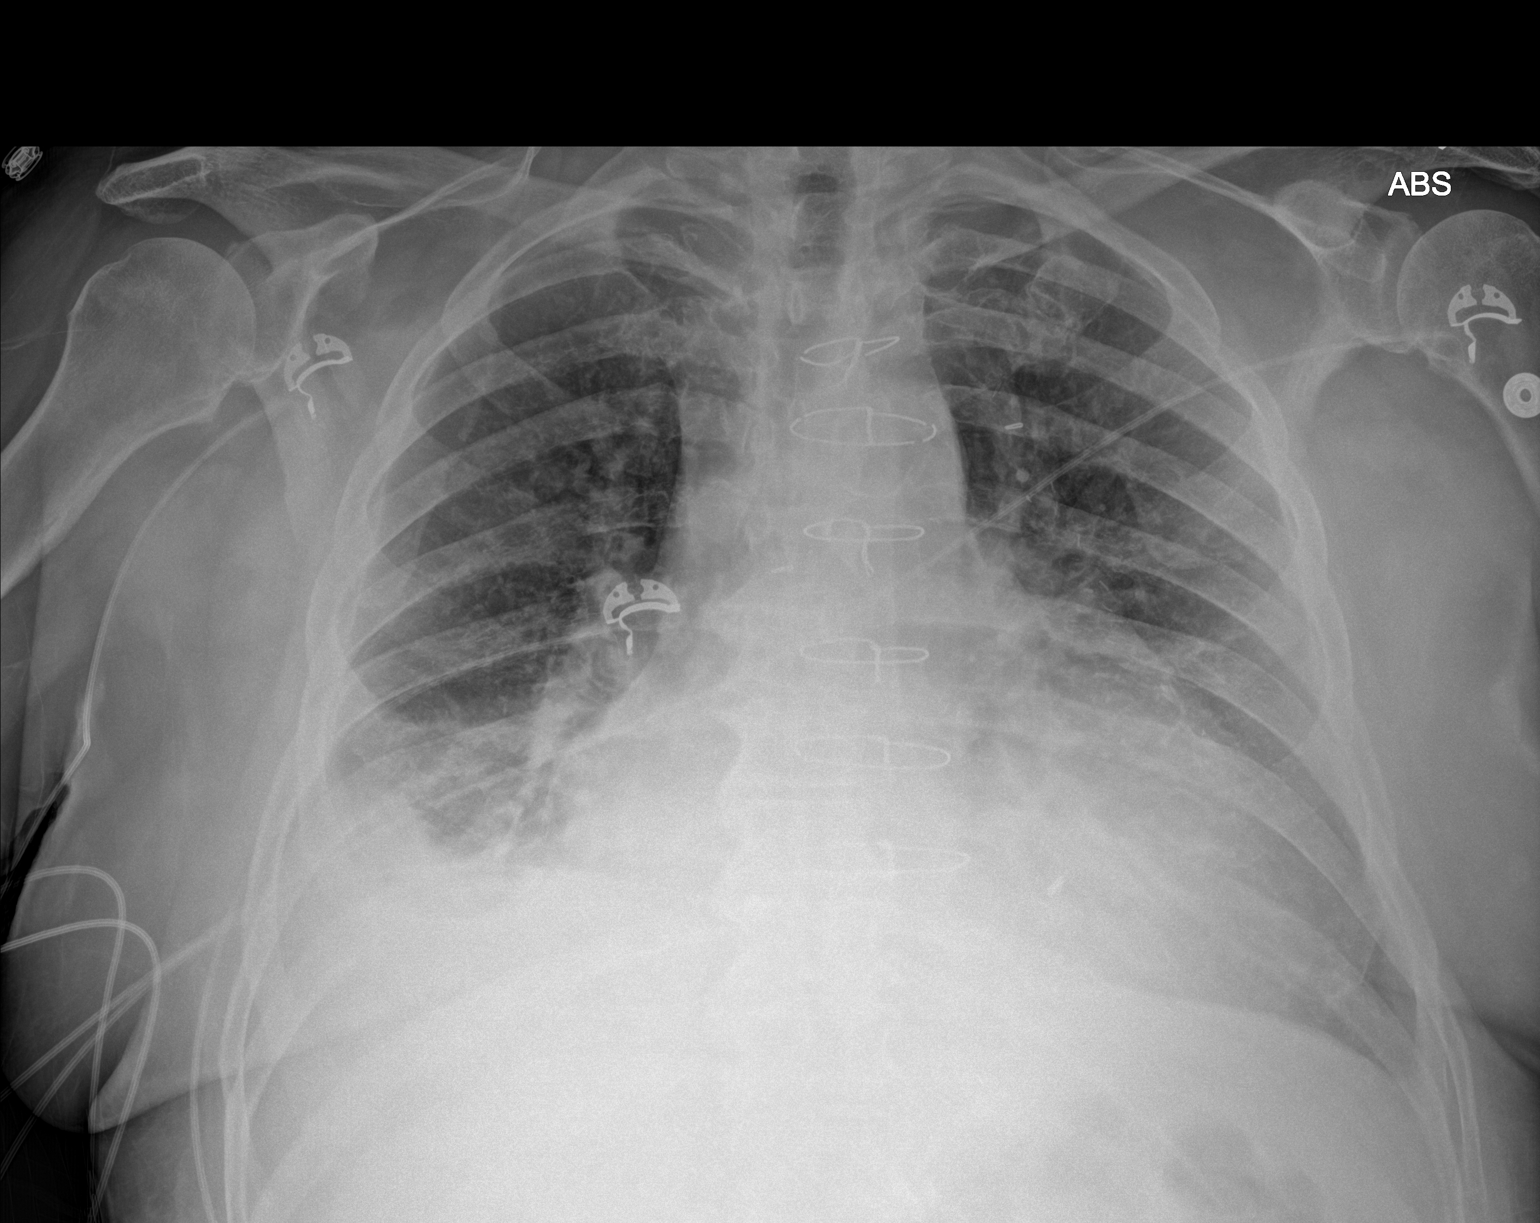

[1 of 1 positions shown; findings below may reference images not displayed]

FINDINGS: There is persistent consolidation in the lateral right base with
small right pleural effusion. The lungs elsewhere are clear. There
is cardiomegaly with pulmonary vascularity normal. No adenopathy.
Patient is status post median sternotomy. The two superior most
sternal wires are fractured, unchanged. No adenopathy. No bone
lesions.
IMPRESSION: Lateral right base airspace consolidation with small right pleural
effusion. Lungs elsewhere clear.

Stable cardiomegaly.

Stable postoperative changes.

## 2020-06-24 ENCOUNTER — Encounter: Payer: Self-pay | Admitting: Podiatry

## 2020-06-24 ENCOUNTER — Other Ambulatory Visit: Payer: Self-pay

## 2020-06-24 ENCOUNTER — Ambulatory Visit (INDEPENDENT_AMBULATORY_CARE_PROVIDER_SITE_OTHER): Payer: Medicare Other | Admitting: Podiatry

## 2020-06-24 DIAGNOSIS — E1142 Type 2 diabetes mellitus with diabetic polyneuropathy: Secondary | ICD-10-CM

## 2020-06-24 DIAGNOSIS — I999 Unspecified disorder of circulatory system: Secondary | ICD-10-CM | POA: Diagnosis not present

## 2020-06-24 DIAGNOSIS — L97922 Non-pressure chronic ulcer of unspecified part of left lower leg with fat layer exposed: Secondary | ICD-10-CM

## 2020-06-24 DIAGNOSIS — L97909 Non-pressure chronic ulcer of unspecified part of unspecified lower leg with unspecified severity: Secondary | ICD-10-CM

## 2020-06-25 NOTE — Progress Notes (Signed)
Subjective:  Patient ID: Jeremiah Chapman, male    DOB: 01-Jun-1946,  MRN: [id number]  Chief Complaint  Patient presents with  . Diabetic Ulcer    Patient presents today for leaky diabetic ulcers on LLE    74 y.o. male presents with the above complaint.  Patient presents with left left leg arterial ulcers that has been present for quite some time.  Patient states that they have been very painful to touch.  Patient is tried Neosporin.  Is there is some redness associated with it.  There is a lot of pain with it.  He states that this happened about a month ago and has progressive gotten worse.  He denies any other acute complaints.  He has not seen anyone else for these ulcers prior to see me.  He does not have any history of getting vascular studies done.   Review of Systems: Negative except as noted in the HPI. Denies N/V/F/Ch.  Past Medical History:  Diagnosis Date  . Atrial fibrillation (HCC)   . CAD (coronary artery disease)   . CHF (congestive heart failure) (HCC)   . Diabetes mellitus without complication (HCC)   . Gout   . Hypercholesterolemia   . Hypertension     Current Outpatient Medications:  .  albuterol (VENTOLIN HFA) 108 (90 Base) MCG/ACT inhaler, Inhale 2 puffs into the lungs every 6 (six) hours as needed. , Disp: , Rfl:  .  apixaban (ELIQUIS) 5 MG TABS tablet, Take 5 mg by mouth every 12 (twelve) hours. , Disp: , Rfl:  .  aspirin 81 MG EC tablet, Take 81 mg by mouth daily. , Disp: , Rfl:  .  atorvastatin (LIPITOR) 80 MG tablet, TAKE 1 TABLET BY MOUTH EVERY DAY, Disp: , Rfl:  .  digoxin (LANOXIN) 0.125 MG tablet, TAKE 1 TABLET BY MOUTH EVERY DAY, Disp: , Rfl:  .  fluticasone (FLONASE) 50 MCG/ACT nasal spray, Place into the nose. Place 2 sprays into both nostrils once daily, Disp: , Rfl:  .  furosemide (LASIX) 20 MG tablet, Take 2 tablets (40 mg total) by mouth 2 (two) times daily., Disp: 30 tablet, Rfl: 0 .  gabapentin (NEURONTIN) 100 MG capsule, Take 100-300 mg by  mouth See admin instructions. Take 1 capsule (100mg ) by mouth at dinnertime and take 3 capsules (300mg ) by mouth at bedtime, Disp: , Rfl:  .  Insulin Degludec 200 UNIT/ML SOPN, Inject 8 Units into the skin daily., Disp: 0.5 mL, Rfl: 0 .  irbesartan (AVAPRO) 300 MG tablet, TAKE 1 TABLET BY MOUTH EVERY DAY, Disp: , Rfl:  .  loratadine (CLARITIN REDITABS) 10 MG dissolvable tablet, Take by mouth., Disp: , Rfl:  .  metFORMIN (GLUCOPHAGE-XR) 500 MG 24 hr tablet, Take 1,000 mg by mouth 2 (two) times daily. , Disp: , Rfl:  .  metoprolol succinate (TOPROL-XL) 25 MG 24 hr tablet, Take 25 mg by mouth daily., Disp: , Rfl:  .  polyethylene glycol (MIRALAX / GLYCOLAX) 17 g packet, Take 17 g by mouth daily., Disp: 14 each, Rfl: 0 .  potassium chloride (KLOR-CON) 10 MEQ tablet, Take 1 tablet (10 mEq total) by mouth daily., Disp: 60 tablet, Rfl: 0 .  senna-docusate (SENOKOT-S) 8.6-50 MG tablet, Take 2 tablets by mouth at bedtime., Disp: 10 tablet, Rfl: 0 .  tamsulosin (FLOMAX) 0.4 MG CAPS capsule, Take 0.4 mg by mouth daily. Take after the same meal each day., Disp: , Rfl:  .  triamcinolone cream (KENALOG) 0.1 %, Apply 1 application topically  2 (two) times daily as needed (skin irritations). , Disp: , Rfl:   Social History   Tobacco Use  Smoking Status Former Smoker  . Packs/day: 0.50  . Years: 50.00  . Pack years: 25.00  . Types: Cigarettes  Smokeless Tobacco Current User    Allergies  Allergen Reactions  . Lactose Intolerance (Gi)   . Sitagliptin Rash and Other (See Comments)    Januvia   Objective:  There were no vitals filed for this visit. There is no height or weight on file to calculate BMI. Constitutional Well developed. Well nourished.  Vascular Dorsalis pedis pulses non palpable bilaterally. Posterior tibial pulses non palpable bilaterally. Capillary refill normal to all digits.  No cyanosis or clubbing noted. Pedal hair growth normal.  Neurologic Normal speech. Oriented to person,  place, and time. Epicritic sensation to light touch grossly present bilaterally.  Dermatologic  left arterial ulcers with fibrogranular tissue.  Dependent rubor noted to both lower extremity.  No purulent drainage identified.  No other clinical signs of infection noted including malodor.  Orthopedic: Normal joint ROM without pain or crepitus bilaterally. No visible deformities. No bony tenderness.   Radiographs: None Assessment:   1. Diabetic polyneuropathy associated with type 2 diabetes mellitus (HCC)   2. Arterial leg ulcer (HCC)   3. Vascular abnormality   4. Leg ulcer, left, with fat layer exposed (HCC)    Plan:  Patient was evaluated and treated and all questions answered.  Left lower leg arterial ulcers x2 -I explained patient the etiology of ulceration and various treatment options were discussed.  Clinically given that the pain that she is experiencing with the dry nature of the wound this is definitely arterial in nature.  Patient does not have good circulation to the lower extremity given there is dependent rubor present as well.  I believe patient will benefit from ABIs PVRs and vascular assessment to the left lower extremity followed by aggressive intervention.  Patient agrees with the plan. -For now we will discuss doing Betadine wet-to-dry dressing changes.  At this time it is not clinically infected and it does not go deep therefore not concerned for any kind of osteomyelitic changes.  However I did discuss with the patient that in the setting of poor vascular flow these ulcers can regress very quickly and can cause very bad infection that can lead to losing leg or life.  Patient states understanding -ABIs PVRs will be ordered to assess the vascular flow -I will hold off on any debridement as patient is having excruciating pain.  Until the vascular flow is restored restored, patient will continue to have vascular related pain. -  No follow-ups on file.

## 2020-06-26 ENCOUNTER — Telehealth: Payer: Self-pay | Admitting: *Deleted

## 2020-06-26 ENCOUNTER — Encounter: Payer: Self-pay | Admitting: Podiatry

## 2020-06-26 DIAGNOSIS — L97922 Non-pressure chronic ulcer of unspecified part of left lower leg with fat layer exposed: Secondary | ICD-10-CM

## 2020-06-26 DIAGNOSIS — L97909 Non-pressure chronic ulcer of unspecified part of unspecified lower leg with unspecified severity: Secondary | ICD-10-CM

## 2020-06-26 DIAGNOSIS — I999 Unspecified disorder of circulatory system: Secondary | ICD-10-CM

## 2020-06-26 DIAGNOSIS — D689 Coagulation defect, unspecified: Secondary | ICD-10-CM

## 2020-06-26 NOTE — Telephone Encounter (Signed)
-----   Message from Candelaria Stagers, DPM sent at 06/26/2020 12:23 PM EDT ----- Regarding: ABIs PVRs Hi Raaga Maeder,  Would you be able to order ABIs PVRs for this patient.  Patient is a Educational psychologist patient however Karoline Caldwell is really behind on catching up on the side stuff.  Thank you appreciated

## 2020-06-26 NOTE — Telephone Encounter (Signed)
[facility] orders faxed.

## 2020-06-30 ENCOUNTER — Other Ambulatory Visit: Payer: Self-pay

## 2020-06-30 ENCOUNTER — Ambulatory Visit: Payer: Medicare Other | Attending: Family | Admitting: Family

## 2020-06-30 ENCOUNTER — Encounter: Payer: Self-pay | Admitting: Family

## 2020-06-30 VITALS — BP 152/68 | HR 44 | Resp 16 | Ht 70.0 in | Wt 186.0 lb

## 2020-06-30 DIAGNOSIS — Z79899 Other long term (current) drug therapy: Secondary | ICD-10-CM | POA: Diagnosis not present

## 2020-06-30 DIAGNOSIS — I482 Chronic atrial fibrillation, unspecified: Secondary | ICD-10-CM | POA: Diagnosis not present

## 2020-06-30 DIAGNOSIS — E785 Hyperlipidemia, unspecified: Secondary | ICD-10-CM | POA: Insufficient documentation

## 2020-06-30 DIAGNOSIS — Z7951 Long term (current) use of inhaled steroids: Secondary | ICD-10-CM | POA: Diagnosis not present

## 2020-06-30 DIAGNOSIS — I110 Hypertensive heart disease with heart failure: Secondary | ICD-10-CM | POA: Insufficient documentation

## 2020-06-30 DIAGNOSIS — I2510 Atherosclerotic heart disease of native coronary artery without angina pectoris: Secondary | ICD-10-CM | POA: Insufficient documentation

## 2020-06-30 DIAGNOSIS — E1122 Type 2 diabetes mellitus with diabetic chronic kidney disease: Secondary | ICD-10-CM

## 2020-06-30 DIAGNOSIS — Z713 Dietary counseling and surveillance: Secondary | ICD-10-CM | POA: Insufficient documentation

## 2020-06-30 DIAGNOSIS — Z955 Presence of coronary angioplasty implant and graft: Secondary | ICD-10-CM | POA: Diagnosis not present

## 2020-06-30 DIAGNOSIS — E7800 Pure hypercholesterolemia, unspecified: Secondary | ICD-10-CM | POA: Insufficient documentation

## 2020-06-30 DIAGNOSIS — I10 Essential (primary) hypertension: Secondary | ICD-10-CM

## 2020-06-30 DIAGNOSIS — Z951 Presence of aortocoronary bypass graft: Secondary | ICD-10-CM | POA: Diagnosis not present

## 2020-06-30 DIAGNOSIS — Z7982 Long term (current) use of aspirin: Secondary | ICD-10-CM | POA: Diagnosis not present

## 2020-06-30 DIAGNOSIS — Z87891 Personal history of nicotine dependence: Secondary | ICD-10-CM | POA: Diagnosis not present

## 2020-06-30 DIAGNOSIS — Z7901 Long term (current) use of anticoagulants: Secondary | ICD-10-CM | POA: Diagnosis not present

## 2020-06-30 DIAGNOSIS — Z794 Long term (current) use of insulin: Secondary | ICD-10-CM | POA: Insufficient documentation

## 2020-06-30 DIAGNOSIS — E119 Type 2 diabetes mellitus without complications: Secondary | ICD-10-CM | POA: Diagnosis not present

## 2020-06-30 DIAGNOSIS — J90 Pleural effusion, not elsewhere classified: Secondary | ICD-10-CM | POA: Diagnosis not present

## 2020-06-30 DIAGNOSIS — Z8249 Family history of ischemic heart disease and other diseases of the circulatory system: Secondary | ICD-10-CM | POA: Diagnosis not present

## 2020-06-30 DIAGNOSIS — I5022 Chronic systolic (congestive) heart failure: Secondary | ICD-10-CM

## 2020-06-30 NOTE — Progress Notes (Signed)
Patient ID: Jeremiah Chapman, male    DOB: October 03, 1946, 74 y.o.   MRN: [id number]  HPI  Mr Brahm is a 74 y/o male with a history of CAD, DM, hyperlipidemia, HTN, atrial fibrillation (chronic), pleural effusion, gout, previous tobacco use and chronic heart failure.   Echo report from 06/03/20 reviewed and showed an EF of 30-35% along with moderate LVH and moderately elevated PA pressure.   Catheterization done 10/30/2019 and showed:  Ost LAD to Prox LAD lesion is 100% stenosed.  Dist LAD lesion is 50% stenosed.  Prox Cx to Mid Cx lesion is 30% stenosed.  Previously placed Mid Cx drug eluting stent is widely patent.  Balloon angioplasty was performed.  Ost 1st Mrg to 1st Mrg lesion is 90% stenosed.  Ost 2nd Mrg to 2nd Mrg lesion is 100% stenosed.  Prox RCA to Mid RCA lesion is 50% stenosed.  SVG and is moderate in size.  LIMA and is moderate in size.  3rd Mrg-1 lesion is 90% stenosed.  3rd Mrg-2 lesion is 80% stenosed.  Admitted 06/01/20 due to acute on chronic HF along with right sided pleural effusion. Initially needed IV lasix with transition to oral diuretics. Had a thoracentesis with removal of 1.9 L of fluid. Hypokalemia corrected. Discharged after 2 days.   He presents today for his initial visit with a chief complaint of moderate fatigue upon moderate exertion. He describes this as chronic in nature having been present for several years. He has associated pedal edema, easy bruising, difficulty sleeping and left lower leg wound along with this. He denies any dizziness, abdominal distention, palpitations, chest pain, shortness of breath, cough or weight gain.   Currently has his left lower leg wrapped and goes to the vascular provider tomorrow. Has had recurrent pleural effusions with both being on the right side.   Past Medical History:  Diagnosis Date  . Atrial fibrillation (HCC)   . CAD (coronary artery disease)   . CHF (congestive heart failure) (HCC)   . Diabetes  mellitus without complication (HCC)   . Gout   . Hypercholesterolemia   . Hypertension   . Pleural effusion    Past Surgical History:  Procedure Laterality Date  . CARDIAC CATHETERIZATION    . CARDIOVERSION N/A 01/20/2017   Procedure: CARDIOVERSION;  Surgeon: Marcina Millard, MD;  Location: [facility] ORS;  Service: Cardiovascular;  Laterality: N/A;  . COLONOSCOPY    . CORONARY ARTERY BYPASS GRAFT    . CORONARY STENT INTERVENTION N/A 03/28/2017   Procedure: Coronary Stent Intervention;  Surgeon: Marcina Millard, MD;  Location: [facility] INVASIVE CV [facility];  Service: Cardiovascular;  Laterality: N/A;  . LEFT HEART CATH AND CORONARY ANGIOGRAPHY N/A 03/28/2017   Procedure: Left Heart Cath and Coronary Angiography;  Surgeon: Marcina Millard, MD;  Location: [facility] INVASIVE CV [facility];  Service: Cardiovascular;  Laterality: N/A;  . LEFT HEART CATH AND CORS/GRAFTS ANGIOGRAPHY N/A 10/30/2019   Procedure: LEFT HEART CATH AND CORS/GRAFTS ANGIOGRAPHY;  Surgeon: Lamar Blinks, MD;  Location: [facility] INVASIVE CV LAB;  Service: Cardiovascular;  Laterality: N/A;  . limbar back surgery    . PENILE PROSTHESIS IMPLANT     Family History  Problem Relation Age of Onset  . Hypertension Mother    Social History   Tobacco Use  . Smoking status: Former Smoker    Packs/day: 0.50    Years: 50.00    Pack years: 25.00    Types: Cigarettes  . Smokeless tobacco: Current User  Substance Use Topics  .  Alcohol use: Yes    Alcohol/week: 0.0 standard drinks   Allergies  Allergen Reactions  . Lactose Intolerance (Gi)   . Sitagliptin Rash and Other (See Comments)    Januvia   Prior to Admission medications   Medication Sig Start Date End Date Taking? Authorizing Provider  albuterol (VENTOLIN HFA) 108 (90 Base) MCG/ACT inhaler Inhale 2 puffs into the lungs every 6 (six) hours as needed.  02/08/20  Yes [provider]  apixaban (ELIQUIS) 5 MG TABS tablet Take 5 mg by mouth every 12 (twelve) hours.    Yes  [provider]  aspirin 81 MG EC tablet Take 81 mg by mouth daily.    Yes [provider]  atorvastatin (LIPITOR) 80 MG tablet TAKE 1 TABLET BY MOUTH EVERY DAY 03/17/20  Yes [provider]  digoxin (LANOXIN) 0.125 MG tablet TAKE 1 TABLET BY MOUTH EVERY DAY 03/17/20  Yes [provider]  fluticasone (FLONASE) 50 MCG/ACT nasal spray Place into the nose. Place 2 sprays into both nostrils once daily 02/19/20 02/18/21 Yes [provider]  furosemide (LASIX) 20 MG tablet Take 2 tablets (40 mg total) by mouth 2 (two) times daily. 06/03/20  Yes Marrion Coy, MD  gabapentin (NEURONTIN) 100 MG capsule Take 100-300 mg by mouth See admin instructions. Take 1 capsule (100mg ) by mouth at dinnertime and take 3 capsules (300mg ) by mouth at bedtime   Yes [provider]  Insulin Degludec 200 UNIT/ML SOPN Inject 8 Units into the skin daily. 11/06/19  Yes 06-15-1974, MD  irbesartan (AVAPRO) 300 MG tablet TAKE 1 TABLET BY MOUTH EVERY DAY 03/17/20  Yes [provider]  loratadine (CLARITIN REDITABS) 10 MG dissolvable tablet Take by mouth. 02/19/20 02/18/21 Yes [provider]  metFORMIN (GLUCOPHAGE-XR) 500 MG 24 hr tablet Take 1,000 mg by mouth 2 (two) times daily.    Yes [provider]  metoprolol succinate (TOPROL-XL) 25 MG 24 hr tablet Take 25 mg by mouth daily. 01/15/20  Yes [provider]  polyethylene glycol (MIRALAX / GLYCOLAX) 17 g packet Take 17 g by mouth daily. 11/07/19  Yes 01/17/20, MD  potassium chloride (KLOR-CON) 10 MEQ tablet Take 1 tablet (10 mEq total) by mouth daily. 06/03/20  Yes Rolly Salter, MD  senna-docusate (SENOKOT-S) 8.6-50 MG tablet Take 2 tablets by mouth at bedtime. 11/06/19  Yes 03-07-1979, MD  tamsulosin (FLOMAX) 0.4 MG CAPS capsule Take 0.4 mg by mouth daily. Take after the same meal each day. 07/20/19 07/19/20 Yes [provider]  triamcinolone cream (KENALOG) 0.1 % Apply 1 application  topically 2 (two) times daily as needed (skin irritations).    Yes [provider]    Review of Systems  Constitutional: Positive for fatigue (with moderate exertion). Negative for appetite change.  HENT: Negative for congestion, postnasal drip and sore throat.   Eyes: Negative.   Respiratory: Negative for cough, chest tightness and shortness of breath.   Cardiovascular: Positive for leg swelling. Negative for chest pain and palpitations.  Gastrointestinal: Negative for abdominal distention and abdominal pain.  Endocrine: Negative.   Genitourinary: Negative.   Musculoskeletal: Negative for back pain and neck pain.  Skin: Positive for wound (left lower leg).  Allergic/Immunologic: Negative.   Neurological: Negative for dizziness and light-headedness.  Hematological: Negative for adenopathy. Bruises/bleeds easily.  Psychiatric/Behavioral: Positive for sleep disturbance. Negative for dysphoric mood. The patient is not nervous/anxious.    Vitals:   06/30/20 1339  BP: (!) 152/68  Pulse: (!) 44  Resp: 16  SpO2: 98%  Weight: 186 lb (84.4 kg)  Height: 5\' 10"  (1.778 m)   Wt Readings from Last 3 Encounters:  06/30/20 186 lb (84.4 kg)  06/03/20 187 lb 9.6 oz (85.1 kg)  11/06/19 208 lb (94.3 kg)   Lab Results  Component Value Date   CREATININE 1.54 (H) 06/03/2020   CREATININE 1.48 (H) 06/02/2020   CREATININE 1.31 (H) 06/01/2020    Physical Exam Vitals and nursing note reviewed.  Constitutional:      Appearance: Normal appearance.  HENT:     Head: Normocephalic and atraumatic.  Cardiovascular:     Rate and Rhythm: Bradycardia present. Rhythm irregular.  Pulmonary:     Effort: Pulmonary effort is normal. No respiratory distress.     Breath sounds: No wheezing or rales.     Comments: Diminished RLL Abdominal:     General: There is no distension.     Palpations: Abdomen is soft.  Musculoskeletal:        General: No tenderness.     Cervical back: Normal range of  motion and neck supple.     Right lower leg: Edema (trace pitting) present.     Left lower leg: Edema present.  Skin:    General: Skin is warm and dry.     Comments: Left lower leg wrapped in ACE bandage  Neurological:     General: No focal deficit present.     Mental Status: He is alert and oriented to person, place, and time.  Psychiatric:        Mood and Affect: Mood normal.        Behavior: Behavior normal.        Thought Content: Thought content normal.    Assessment & Plan:  1: Chronic heart failure with reduced ejection fraction- - NYHA class II/ III - euvolemic today - weighing daily and he was instructed to call for an overnight weight gain of >2 pounds or a weekly weight gain of >5 pounds - admits to liking the taste of salt but is aware of not adding salt to his food; encouraged him to try using Mrs. Dash, pepper or other seasonings that don't contain salt - saw cardiology 08/02/2020) 05/19/20 - consider changing his irbesartan to entresto at future visits - BNP 06/01/20 was 1909.0 - reports receiving both COVID vaccines  2: HTN- - BP mildly elevated today; continue to monitor - saw PCP 08/02/20) 06/06/20 - BMP 06/06/20 reviewed and showed sodium 142, potassium 4.2, creatinine 1.5 and GFR 46  3: DM- - A1c 06/01/20 was 8.0% - glucose at home this morning was 132  4: Recurrent pleural effusion- - recent right-sided thoracentesis done - saw pulmonology 08/02/20) 04/04/20   Medication list was reviewed.   Return in 6 weeks or sooner for any questions/problems before then.

## 2020-06-30 NOTE — Patient Instructions (Signed)
Continue weighing daily and call for an overnight weight gain of > 2 pounds or a weekly weight gain of >5 pounds. 

## 2020-07-01 ENCOUNTER — Other Ambulatory Visit: Payer: Self-pay

## 2020-07-01 ENCOUNTER — Ambulatory Visit (INDEPENDENT_AMBULATORY_CARE_PROVIDER_SITE_OTHER): Payer: Medicare Other

## 2020-07-01 DIAGNOSIS — L97922 Non-pressure chronic ulcer of unspecified part of left lower leg with fat layer exposed: Secondary | ICD-10-CM

## 2020-07-01 DIAGNOSIS — I999 Unspecified disorder of circulatory system: Secondary | ICD-10-CM

## 2020-07-01 DIAGNOSIS — L97909 Non-pressure chronic ulcer of unspecified part of unspecified lower leg with unspecified severity: Secondary | ICD-10-CM

## 2020-07-01 DIAGNOSIS — D689 Coagulation defect, unspecified: Secondary | ICD-10-CM

## 2020-07-14 ENCOUNTER — Telehealth: Payer: Self-pay

## 2020-07-14 NOTE — Telephone Encounter (Signed)
For now do Betadine wet-to-dry dressings three times weekly.  Based on what his vascular study shows I can make changes accordingly.  Thank you

## 2020-07-14 NOTE — Telephone Encounter (Signed)
Elease Hashimoto, nurse with [facility] was notified of Dr. Jill Alexanders orders.

## 2020-07-14 NOTE — Telephone Encounter (Signed)
Elease Hashimoto, nurse with [facility] called needing clarification on wound care orders and approved nurse visits.  Please advise

## 2020-07-17 ENCOUNTER — Other Ambulatory Visit: Payer: Self-pay

## 2020-07-17 ENCOUNTER — Encounter: Payer: Self-pay | Admitting: Podiatry

## 2020-07-17 ENCOUNTER — Ambulatory Visit: Payer: Medicare Other | Admitting: Podiatry

## 2020-07-17 ENCOUNTER — Ambulatory Visit (INDEPENDENT_AMBULATORY_CARE_PROVIDER_SITE_OTHER): Payer: Medicare Other | Admitting: Podiatry

## 2020-07-17 DIAGNOSIS — L97922 Non-pressure chronic ulcer of unspecified part of left lower leg with fat layer exposed: Secondary | ICD-10-CM

## 2020-07-17 DIAGNOSIS — L97909 Non-pressure chronic ulcer of unspecified part of unspecified lower leg with unspecified severity: Secondary | ICD-10-CM | POA: Diagnosis not present

## 2020-07-17 DIAGNOSIS — I999 Unspecified disorder of circulatory system: Secondary | ICD-10-CM

## 2020-07-18 ENCOUNTER — Encounter: Payer: Self-pay | Admitting: Podiatry

## 2020-07-18 NOTE — Progress Notes (Signed)
Subjective:  Patient ID: Jeremiah Chapman, male    DOB: 09/14/1946,  MRN: [id number]  Chief Complaint  Patient presents with  . Wound Check    "I don't think its any better and its very painful"    74 y.o. male presents for wound care.  Patient follows up with left leg arterial ulcers.  Patient states that the ulcers are very painful to touch.  He had ABIs PVRs done that showed excellent flow to the lower extremity.  He has been doing local wound care.  He denies any other acute complaints.  He would like to know what his next treatment plans are given that he has optimal flow to the lower extremity.   Review of Systems: Negative except as noted in the HPI. Denies N/V/F/Ch.  Past Medical History:  Diagnosis Date  . Atrial fibrillation (HCC)   . CAD (coronary artery disease)   . CHF (congestive heart failure) (HCC)   . Diabetes mellitus without complication (HCC)   . Gout   . Hypercholesterolemia   . Hypertension   . Pleural effusion     Current Outpatient Medications:  .  albuterol (VENTOLIN HFA) 108 (90 Base) MCG/ACT inhaler, Inhale 2 puffs into the lungs every 6 (six) hours as needed. , Disp: , Rfl:  .  apixaban (ELIQUIS) 5 MG TABS tablet, Take 5 mg by mouth every 12 (twelve) hours. , Disp: , Rfl:  .  aspirin 81 MG EC tablet, Take 81 mg by mouth daily. , Disp: , Rfl:  .  atorvastatin (LIPITOR) 80 MG tablet, TAKE 1 TABLET BY MOUTH EVERY DAY, Disp: , Rfl:  .  digoxin (LANOXIN) 0.125 MG tablet, TAKE 1 TABLET BY MOUTH EVERY DAY, Disp: , Rfl:  .  fluticasone (FLONASE) 50 MCG/ACT nasal spray, Place into the nose. Place 2 sprays into both nostrils once daily, Disp: , Rfl:  .  furosemide (LASIX) 20 MG tablet, Take 2 tablets (40 mg total) by mouth 2 (two) times daily., Disp: 30 tablet, Rfl: 0 .  gabapentin (NEURONTIN) 100 MG capsule, Take 100-300 mg by mouth See admin instructions. Take 1 capsule (100mg ) by mouth at dinnertime and take 3 capsules (300mg ) by mouth at bedtime, Disp: , Rfl:    .  Insulin Degludec 200 UNIT/ML SOPN, Inject 8 Units into the skin daily., Disp: 0.5 mL, Rfl: 0 .  irbesartan (AVAPRO) 300 MG tablet, TAKE 1 TABLET BY MOUTH EVERY DAY, Disp: , Rfl:  .  loratadine (CLARITIN REDITABS) 10 MG dissolvable tablet, Take by mouth., Disp: , Rfl:  .  metFORMIN (GLUCOPHAGE-XR) 500 MG 24 hr tablet, Take 1,000 mg by mouth 2 (two) times daily. , Disp: , Rfl:  .  metoprolol succinate (TOPROL-XL) 25 MG 24 hr tablet, Take 25 mg by mouth daily., Disp: , Rfl:  .  polyethylene glycol (MIRALAX / GLYCOLAX) 17 g packet, Take 17 g by mouth daily., Disp: 14 each, Rfl: 0 .  potassium chloride (KLOR-CON) 10 MEQ tablet, Take 1 tablet (10 mEq total) by mouth daily., Disp: 60 tablet, Rfl: 0 .  senna-docusate (SENOKOT-S) 8.6-50 MG tablet, Take 2 tablets by mouth at bedtime., Disp: 10 tablet, Rfl: 0 .  tamsulosin (FLOMAX) 0.4 MG CAPS capsule, Take 0.4 mg by mouth daily. Take after the same meal each day., Disp: , Rfl:  .  triamcinolone cream (KENALOG) 0.1 %, Apply 1 application topically 2 (two) times daily as needed (skin irritations). , Disp: , Rfl:   Social History   Tobacco Use  Smoking  Status Former Smoker  . Packs/day: 0.50  . Years: 50.00  . Pack years: 25.00  . Types: Cigarettes  Smokeless Tobacco Current User    Allergies  Allergen Reactions  . Lactose Intolerance (Gi)   . Sitagliptin Rash and Other (See Comments)    Januvia   Objective:  There were no vitals filed for this visit. There is no height or weight on file to calculate BMI. Constitutional Well developed. Well nourished.  Vascular Dorsalis pedis pulses palpable bilaterally. Posterior tibial pulses palpable bilaterally. Capillary refill normal to all digits.  No cyanosis or clubbing noted. Pedal hair growth normal.  Neurologic Normal speech. Oriented to person, place, and time. Protective sensation absent  Dermatologic Wound Location: Left posterior leg and left lateral leg with fat layer exposed.  Does  not probe down to bone.  No cellulitis or erythema noted.  No malodor present. Wound Base: Mixed Granular/Fibrotic Peri-wound: Normal Exudate: Scant/small amount Serous exudate Wound Measurements: -See below  Orthopedic: No pain to palpation either foot.   Radiographs: None Assessment:   1. Arterial leg ulcer (HCC)   2. Vascular abnormality   3. Leg ulcer, left, with fat layer exposed (HCC)    Plan:  Patient was evaluated and treated and all questions answered.  Ulcer left arterial leg ulcer x2 -Debridement as below. -Dressed with Betadine wet-to-dry, DSD. -Continue off-loading with surgical shoe. -ABIs PVR showed adequate flow to the lower extremity to allow for these ulcerations to heal.  Given that patient has good flow I will discuss with her about graft application. -I will work with [facility] for application for this patient.  If patient does not receive this graft he is at a high risk of limb loss.  Given the amount of clinical pain that he is having secondary to being very arterial in nature I believe patient will definitely benefit from the graft.  Given that the wound has been present for greater than a month without decreasing in size and depth, he will need this graft to allow for closure.  Procedure: Excisional Debridement of Wound posterior leg~stagnant Tool: Sharp chisel blade/tissue nipper Rationale: Removal of non-viable soft tissue from the wound to promote healing.  Anesthesia: none Pre-Debridement Wound Measurements: 6  cm x 4 cm x 0.3 cm  Post-Debridement Wound Measurements: 6.1 cm x 4.1 cm x 0.3 cm  Type of Debridement: Sharp Excisional Tissue Removed: Non-viable soft tissue Blood loss: Minimal (<50cc) Depth of Debridement: subcutaneous tissue. Technique: Sharp excisional debridement to bleeding, viable wound base.  Wound Progress: The wound has been stagnant. Dressing: Dry, sterile, compression dressing. Disposition: Patient tolerated procedure  well. Patient to return in 1 week for follow-up.  And  Procedure: Excisional Debridement of Wound posterior leg~stagnant Tool: Sharp chisel blade/tissue nipper Rationale: Removal of non-viable soft tissue from the wound to promote healing.  Anesthesia: none Pre-Debridement Wound Measurements: 2 cm x 2 cm x 0.3 cm Post-Debridement Wound Measurements: 2.1 cm x 2 cm x 0.3 cm Type of Debridement: Sharp Excisional Tissue Removed: Non-viable soft tissue Blood loss: Minimal (<50cc) Depth of Debridement: subcutaneous tissue. Technique: Sharp excisional debridement to bleeding, viable wound base.  Wound Progress: The wound has been stagnant. Dressing: Dry, sterile, compression dressing. Disposition: Patient tolerated procedure well. Patient to return in 1 week for follow-up.  No follow-ups on file.

## 2020-07-21 ENCOUNTER — Ambulatory Visit: Payer: Medicare Other | Admitting: Podiatry

## 2020-07-24 ENCOUNTER — Ambulatory Visit (INDEPENDENT_AMBULATORY_CARE_PROVIDER_SITE_OTHER): Payer: Medicare Other | Admitting: Podiatry

## 2020-07-24 ENCOUNTER — Other Ambulatory Visit: Payer: Self-pay

## 2020-07-24 ENCOUNTER — Encounter: Payer: Self-pay | Admitting: Podiatry

## 2020-07-24 DIAGNOSIS — M79676 Pain in unspecified toe(s): Secondary | ICD-10-CM | POA: Diagnosis not present

## 2020-07-24 DIAGNOSIS — B351 Tinea unguium: Secondary | ICD-10-CM

## 2020-07-24 DIAGNOSIS — E1142 Type 2 diabetes mellitus with diabetic polyneuropathy: Secondary | ICD-10-CM

## 2020-07-24 NOTE — Progress Notes (Signed)
This patient returns to my office for at risk foot care.  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  Patient is taking eliquiss.  This patient is unable to cut nails himself since the patient cannot reach his nails.These nails are painful walking and wearing shoes.  This patient presents for at risk foot care today.  General Appearance  Alert, conversant and in no acute stress.  Vascular  Dorsalis pedis and posterior tibial  pulses are palpable  bilaterally.  Capillary return is within normal limits  bilaterally. Temperature is within normal limits  bilaterally.  Neurologic  Senn-Weinstein monofilament wire test within normal limits  bilaterally. Muscle power within normal limits bilaterally.  Nails Thick disfigured discolored nails with subungual debris  from hallux to fifth toes bilaterally. No evidence of bacterial infection or drainage bilaterally.  Orthopedic  No limitations of motion  feet .  No crepitus or effusions noted.  No bony pathology or digital deformities noted.  HAV  B/L.  Skin  normotropic skin with no porokeratosis noted bilaterally.  No signs of infections or ulcers noted.     Onychomycosis  Pain in right toes  Pain in left toes  Consent was obtained for treatment procedures.   Mechanical debridement of nails 1-5  bilaterally performed with a nail nipper.  Filed with dremel without incident.    Return office visit    3 months                 Told patient to return for periodic foot care and evaluation due to potential at risk complications.   Helane Gunther DPM

## 2020-07-29 ENCOUNTER — Encounter: Payer: Self-pay | Admitting: Podiatry

## 2020-07-29 ENCOUNTER — Other Ambulatory Visit: Payer: Self-pay

## 2020-07-29 ENCOUNTER — Ambulatory Visit (INDEPENDENT_AMBULATORY_CARE_PROVIDER_SITE_OTHER): Payer: Medicare Other | Admitting: Podiatry

## 2020-07-29 DIAGNOSIS — E1142 Type 2 diabetes mellitus with diabetic polyneuropathy: Secondary | ICD-10-CM

## 2020-07-29 DIAGNOSIS — L97909 Non-pressure chronic ulcer of unspecified part of unspecified lower leg with unspecified severity: Secondary | ICD-10-CM | POA: Diagnosis not present

## 2020-07-30 ENCOUNTER — Telehealth: Payer: Self-pay

## 2020-07-30 ENCOUNTER — Encounter: Payer: Self-pay | Admitting: Podiatry

## 2020-07-30 ENCOUNTER — Other Ambulatory Visit
Admission: RE | Admit: 2020-07-30 | Discharge: 2020-07-30 | Disposition: A | Discharge status: Home / Self Care | Payer: Medicare Other | Source: Ambulatory Visit | Attending: Specialist | Admitting: Specialist

## 2020-07-30 DIAGNOSIS — J90 Pleural effusion, not elsewhere classified: Secondary | ICD-10-CM | POA: Diagnosis present

## 2020-07-30 DIAGNOSIS — I5022 Chronic systolic (congestive) heart failure: Secondary | ICD-10-CM | POA: Diagnosis present

## 2020-07-30 DIAGNOSIS — R0602 Shortness of breath: Secondary | ICD-10-CM | POA: Diagnosis present

## 2020-07-30 LAB — BRAIN NATRIURETIC PEPTIDE: B Natriuretic Peptide: 1204 pg/mL — ABNORMAL HIGH (ref 0.0–100.0)

## 2020-07-30 NOTE — Progress Notes (Signed)
Subjective:  Patient ID: Jeremiah Chapman, male    DOB: [birth year],  MRN: [id number]  Chief Complaint  Patient presents with  . Diabetic Ulcer    dressing change, graft placement    74 y.o. male presents for wound care.  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  Patient states that he had an acute onset of new ulceration to the lateral proximal leg.  He would like to discuss treatment options.  He was approved for tides medical graft.  We will begin the application right away.   Review of Systems: Negative except as noted in the HPI. Denies N/V/F/Ch.  Past Medical History:  Diagnosis Date  . Atrial fibrillation (HCC)   . CAD (coronary artery disease)   . CHF (congestive heart failure) (HCC)   . Diabetes mellitus without complication (HCC)   . Gout   . Hypercholesterolemia   . Hypertension   . Pleural effusion     Current Outpatient Medications:  .  albuterol (VENTOLIN HFA) 108 (90 Base) MCG/ACT inhaler, Inhale 2 puffs into the lungs every 6 (six) hours as needed. , Disp: , Rfl:  .  apixaban (ELIQUIS) 5 MG TABS tablet, Take 5 mg by mouth every 12 (twelve) hours. , Disp: , Rfl:  .  aspirin 81 MG EC tablet, Take 81 mg by mouth daily. , Disp: , Rfl:  .  atorvastatin (LIPITOR) 80 MG tablet, TAKE 1 TABLET BY MOUTH EVERY DAY, Disp: , Rfl:  .  digoxin (LANOXIN) 0.125 MG tablet, TAKE 1 TABLET BY MOUTH EVERY DAY, Disp: , Rfl:  .  fluticasone (FLONASE) 50 MCG/ACT nasal spray, Place into the nose. Place 2 sprays into both nostrils once daily, Disp: , Rfl:  .  furosemide (LASIX) 20 MG tablet, Take 2 tablets (40 mg total) by mouth 2 (two) times daily., Disp: 30 tablet, Rfl: 0 .  gabapentin (NEURONTIN) 100 MG capsule, Take 100-300 mg by mouth See admin instructions. Take 1 capsule (100mg ) by mouth at dinnertime and take 3 capsules (300mg ) by mouth at bedtime, Disp: , Rfl:  .  Insulin Degludec 200 UNIT/ML SOPN, Inject 8 Units into the skin daily., Disp: 0.5 mL, Rfl: 0 .  irbesartan  (AVAPRO) 300 MG tablet, TAKE 1 TABLET BY MOUTH EVERY DAY, Disp: , Rfl:  .  loratadine (CLARITIN REDITABS) 10 MG dissolvable tablet, Take by mouth., Disp: , Rfl:  .  metFORMIN (GLUCOPHAGE-XR) 500 MG 24 hr tablet, Take 1,000 mg by mouth 2 (two) times daily. , Disp: , Rfl:  .  metoprolol succinate (TOPROL-XL) 25 MG 24 hr tablet, Take 25 mg by mouth daily., Disp: , Rfl:  .  polyethylene glycol (MIRALAX / GLYCOLAX) 17 g packet, Take 17 g by mouth daily., Disp: 14 each, Rfl: 0 .  potassium chloride (KLOR-CON) 10 MEQ tablet, Take 1 tablet (10 mEq total) by mouth daily., Disp: 60 tablet, Rfl: 0 .  senna-docusate (SENOKOT-S) 8.6-50 MG tablet, Take 2 tablets by mouth at bedtime., Disp: 10 tablet, Rfl: 0 .  triamcinolone cream (KENALOG) 0.1 %, Apply 1 application topically 2 (two) times daily as needed (skin irritations). , Disp: , Rfl:   Social History   Tobacco Use  Smoking Status Former Smoker  . Packs/day: 0.50  . Years: 50.00  . Pack years: 25.00  . Types: Cigarettes  Smokeless Tobacco Current User    Allergies  Allergen Reactions  . Lactose Intolerance (Gi)   . Sitagliptin Rash and Other (See Comments)    Januvia  Objective:  There were no vitals filed for this visit. There is no height or weight on file to calculate BMI. Constitutional Well developed. Well nourished.  Vascular Dorsalis pedis pulses palpable bilaterally. Posterior tibial pulses palpable bilaterally. Capillary refill normal to all digits.  No cyanosis or clubbing noted. Pedal hair growth normal.  Neurologic Normal speech. Oriented to person, place, and time. Protective sensation absent  Dermatologic Wound Location: Left posterior leg and left lateral leg with fat layer exposed.  Does not probe down to bone.  No cellulitis or erythema noted.  No malodor present. Wound Base: Mixed Granular/Fibrotic Peri-wound: Normal Exudate: Scant/small amount Serous exudate Wound Measurements: -See below  Orthopedic: No pain  to palpation either foot.   Radiographs: None Assessment:   1. Arterial leg ulcer (HCC)   2. Diabetic polyneuropathy associated with type 2 diabetes mellitus (HCC)    Plan:  Patient was evaluated and treated and all questions answered.  Ulcer left arterial leg ulcer x3 -Debridement as below. -Dressed with Betadine wet-to-dry, DSD. -Continue off-loading with surgical shoe. -ABIs PVR showed adequate flow to the lower extremity to allow for these ulcerations to heal.  Given that patient has good flow I will discuss with her about graft application. -Patient was approved for tides medical graft.  We will start beginning the application process today.  This will be the first application.  We will continue to monitor the progression of it.  Procedure: Excisional Debridement of Wound posterior leg~stagnant Tool: Sharp chisel blade/tissue nipper Rationale: Removal of non-viable soft tissue from the wound to promote healing.  Anesthesia: none Pre-Debridement Wound Measurements: 6  cm x 4 cm x 0.3 cm  Post-Debridement Wound Measurements: 6.1 cm x 4.1 cm x 0.3 cm  Type of Debridement: Sharp Excisional Tissue Removed: Non-viable soft tissue Blood loss: Minimal (<50cc) Depth of Debridement: subcutaneous tissue. Technique: Sharp excisional debridement to bleeding, viable wound base.  Wound Progress: The wound has been stagnant. Dressing: Dry, sterile, compression dressing. Disposition: Patient tolerated procedure well. Patient to return in 1 week for follow-up.  And  Procedure: Excisional Debridement of Wound posterior leg~stagnant Tool: Sharp chisel blade/tissue nipper Rationale: Removal of non-viable soft tissue from the wound to promote healing.  Anesthesia: none Pre-Debridement Wound Measurements: 2 cm x 2 cm x 0.3 cm Post-Debridement Wound Measurements: 2.1 cm x 2 cm x 0.3 cm Type of Debridement: Sharp Excisional Tissue Removed: Non-viable soft tissue Blood loss: Minimal  (<50cc) Depth of Debridement: subcutaneous tissue. Technique: Sharp excisional debridement to bleeding, viable wound base.  Wound Progress: The wound has been stagnant. Dressing: Dry, sterile, compression dressing. Disposition: Patient tolerated procedure well. Patient to return in 1 week for follow-up.  Procedure: Excisional Debridement of Wound anterior proximal leg~stagnant Tool: Sharp chisel blade/tissue nipper Rationale: Removal of non-viable soft tissue from the wound to promote healing.  Anesthesia: none Pre-Debridement Wound Measurements: 5 cm x 2 cm x 0.3 cm Post-Debridement Wound Measurements: 5.1 cm x 2.1 cm x 0.3 cm Type of Debridement: Sharp Excisional Tissue Removed: Non-viable soft tissue Blood loss: Minimal (<50cc) Depth of Debridement: subcutaneous tissue. Technique: Sharp excisional debridement to bleeding, viable wound base.  Wound Progress: The wound has been stagnant. Dressing: Dry, sterile, compression dressing. Disposition: Patient tolerated procedure well. Patient to return in 1 week for follow-up.  No follow-ups on file.

## 2020-07-30 NOTE — Telephone Encounter (Signed)
Patient called and stated that you were supposed to send in an antibiotic, but its not at the pharmacy and it is not documented in your office note.  Please advise

## 2020-07-31 MED ORDER — DOXYCYCLINE HYCLATE 100 MG PO TABS
100.0000 mg | ORAL_TABLET | Freq: Two times a day (BID) | ORAL | 0 refills | Status: DC
Start: 2020-07-31 — End: 2020-08-14

## 2020-07-31 NOTE — Telephone Encounter (Signed)
Patient notified that script has been sent to pharmacy

## 2020-07-31 NOTE — Telephone Encounter (Signed)
None I sent in doxycycline

## 2020-07-31 NOTE — Addendum Note (Signed)
Addended by: Nicholes Rough on: 07/31/2020 01:29 PM   Modules accepted: Orders

## 2020-08-04 NOTE — Progress Notes (Signed)
Patient ID: Jeremiah Chapman, male    DOB: [birth year], 74 y.o.   MRN: [id number]  HPI  Jeremiah Chapman is a 74 y/o male with a history of CAD, DM, hyperlipidemia, HTN, atrial fibrillation (chronic), pleural effusion, gout, previous tobacco use and chronic heart failure.   Echo report from 06/03/20 reviewed and showed an EF of 30-35% along with moderate LVH and moderately elevated PA pressure.   Catheterization done 10/30/2019 and showed:  Ost LAD to Prox LAD lesion is 100% stenosed.  Dist LAD lesion is 50% stenosed.  Prox Cx to Mid Cx lesion is 30% stenosed.  Previously placed Mid Cx drug eluting stent is widely patent.  Balloon angioplasty was performed.  Ost 1st Mrg to 1st Mrg lesion is 90% stenosed.  Ost 2nd Mrg to 2nd Mrg lesion is 100% stenosed.  Prox RCA to Mid RCA lesion is 50% stenosed.  SVG and is moderate in size.  LIMA and is moderate in size.  3rd Mrg-1 lesion is 90% stenosed.  3rd Mrg-2 lesion is 80% stenosed.  Admitted 06/01/20 due to acute on chronic HF along with right sided pleural effusion. Initially needed IV lasix with transition to oral diuretics. Had a thoracentesis with removal of 1.9 L of fluid. Hypokalemia corrected. Discharged after 2 days.   He presents today for a follow-up visit with a chief complaint of minimal shortness of breath upon moderate exertion. He describes this as chronic in nature having been present for several years. He has associated pedal edema, wound on left lower leg, easy bruising and weight loss along with this. He denies any dizziness, abdominal distention, palpitations, chest pain, cough or fatigue.   Has had metoprolol decreased due to bradycardia.   Past Medical History:  Diagnosis Date  . Atrial fibrillation (HCC)   . CAD (coronary artery disease)   . CHF (congestive heart failure) (HCC)   . Diabetes mellitus without complication (HCC)   . Gout   . Hypercholesterolemia   . Hypertension   . Pleural effusion    Past  Surgical History:  Procedure Laterality Date  . CARDIAC CATHETERIZATION    . CARDIOVERSION N/A 01/20/2017   Procedure: CARDIOVERSION;  Surgeon: Marcina Millard, MD;  Location: [facility] ORS;  Service: Cardiovascular;  Laterality: N/A;  . COLONOSCOPY    . CORONARY ARTERY BYPASS GRAFT    . CORONARY STENT INTERVENTION N/A 03/28/2017   Procedure: Coronary Stent Intervention;  Surgeon: Marcina Millard, MD;  Location: [facility]ASIVE [facility];  Service: Cardiovascular;  Laterality: N/A;  . LEFT HEART CATH AND CORONARY ANGIOGRAPHY N/A 03/28/2017   Procedure: Left Heart Cath and Coronary Angiography;  Surgeon: Marcina Millard, MD;  Location: [facility];  Service: Cardiovascular;  Laterality: N/A;  . LEFT HEART CATH AND CORS/GRAFTS ANGIOGRAPHY N/A 10/30/2019   Procedure: LEFT HEART CATH AND CORS/GRAFTS ANGIOGRAPHY;  Surgeon: Lamar Blinks, MD;  Location: [facility];  Service: Cardiovascular;  Laterality: N/A;  . limbar back surgery    . PENILE PROSTHESIS IMPLANT     Family History  Problem Relation Age of Onset  . Hypertension Mother    Social History   Tobacco Use  . Smoking status: Former Smoker    Packs/day: 0.50    Years: 50.00    Pack years: 25.00    Types: Cigarettes  . Smokeless tobacco: Current User  Substance Use Topics  . Alcohol use: Yes    Alcohol/week: 0.0 standard drinks   Allergies  Allergen Reactions  . Lactose Intolerance (  Gi)   . Sitagliptin Rash and Other (See Comments)    Januvia   Prior to Admission medications   Medication Sig Start Date End Date Taking? Authorizing Provider  albuterol (VENTOLIN HFA) 108 (90 Base) MCG/ACT inhaler Inhale 2 puffs into the lungs every 6 (six) hours as needed.  02/08/20  Yes [provider]  apixaban (ELIQUIS) 5 MG TABS tablet Take 5 mg by mouth every 12 (twelve) hours.    Yes [provider]  aspirin 81 MG EC tablet Take 81 mg by mouth daily.    Yes [provider]  atorvastatin  (LIPITOR) 80 MG tablet TAKE 1 TABLET BY MOUTH EVERY DAY 03/17/20  Yes [provider]  digoxin (LANOXIN) 0.125 MG tablet TAKE 1 TABLET BY MOUTH EVERY DAY 03/17/20  Yes [provider]  doxycycline (VIBRA-TABS) 100 MG tablet Take 1 tablet (100 mg total) by mouth 2 (two) times daily. 07/31/20  Yes Candelaria Stagers, DPM  fluticasone (FLONASE) 50 MCG/ACT nasal spray Place into the nose. Place 2 sprays into both nostrils once daily 02/19/20 02/18/21 Yes [provider]  furosemide (LASIX) 20 MG tablet Take 2 tablets (40 mg total) by mouth 2 (two) times daily. 06/03/20  Yes Marrion Coy, MD  gabapentin (NEURONTIN) 100 MG capsule Take 100-300 mg by mouth See admin instructions. Take 1 capsule (100mg ) by mouth at dinnertime and take 3 capsules (300mg ) by mouth at bedtime   Yes [provider]  Insulin Degludec 200 UNIT/ML SOPN Inject 8 Units into the skin daily. 11/06/19  Yes 06-15-1974, MD  irbesartan (AVAPRO) 300 MG tablet TAKE 1 TABLET BY MOUTH EVERY DAY 03/17/20  Yes [provider]  loratadine (CLARITIN REDITABS) 10 MG dissolvable tablet Take by mouth. 02/19/20 02/18/21 Yes [provider]  metFORMIN (GLUCOPHAGE-XR) 500 MG 24 hr tablet Take 1,000 mg by mouth 2 (two) times daily.    Yes [provider]  metoprolol succinate (TOPROL-XL) 25 MG 24 hr tablet Take 12.5 mg by mouth daily.  01/15/20  Yes [provider]  polyethylene glycol (MIRALAX / GLYCOLAX) 17 g packet Take 17 g by mouth daily. 11/07/19  Yes 01/17/20, MD  potassium chloride (KLOR-CON) 10 MEQ tablet Take 1 tablet (10 mEq total) by mouth daily. 06/03/20  Yes Rolly Salter, MD  senna-docusate (SENOKOT-S) 8.6-50 MG tablet Take 2 tablets by mouth at bedtime. 11/06/19  Yes 03-07-1979, MD  triamcinolone cream (KENALOG) 0.1 % Apply 1 application topically 2 (two) times daily as needed (skin irritations).    Yes [provider]    Review of Systems  Constitutional:  Negative for appetite change and fatigue.  HENT: Negative for congestion, postnasal drip and sore throat.   Eyes: Negative.   Respiratory: Positive for shortness of breath (with moderate exertion). Negative for cough and chest tightness.   Cardiovascular: Positive for leg swelling. Negative for chest pain and palpitations.  Gastrointestinal: Negative for abdominal distention and abdominal pain.  Endocrine: Negative.   Genitourinary: Negative.   Musculoskeletal: Negative for back pain and neck pain.  Skin: Positive for wound (left lower leg).  Allergic/Immunologic: Negative.   Neurological: Negative for dizziness and light-headedness.  Hematological: Negative for adenopathy. Bruises/bleeds easily.  Psychiatric/Behavioral: Positive for sleep disturbance. Negative for dysphoric mood. The patient is not nervous/anxious.    Vitals:   08/05/20 1259  BP: (!) 111/56  Pulse: (!) 58  Resp: 16  SpO2: 98%  Weight: 166 lb 8 oz (75.5 kg)  Height:  5\' 9"  (1.753 m)   Wt Readings from Last 3 Encounters:  08/05/20 166 lb 8 oz (75.5 kg)  06/30/20 186 lb (84.4 kg)  06/03/20 187 lb 9.6 oz (85.1 kg)   Lab Results  Component Value Date   CREATININE 1.54 (H) 06/03/2020   CREATININE 1.48 (H) 06/02/2020   CREATININE 1.31 (H) 06/01/2020   Physical Exam Vitals and nursing note reviewed.  Constitutional:      Appearance: Normal appearance.  HENT:     Head: Normocephalic and atraumatic.  Cardiovascular:     Rate and Rhythm: Bradycardia present. Rhythm irregular.  Pulmonary:     Effort: Pulmonary effort is normal. No respiratory distress.     Breath sounds: No wheezing or rales.     Comments: Diminished RLL Abdominal:     General: There is no distension.     Palpations: Abdomen is soft.  Musculoskeletal:        General: No tenderness.     Cervical back: Normal range of motion and neck supple.     Right lower leg: No edema.     Left lower leg: Edema present.  Skin:    General: Skin is warm and  dry.     Comments: Left lower leg wrapped in ACE bandage  Neurological:     General: No focal deficit present.     Mental Status: He is alert and oriented to person, place, and time.  Psychiatric:        Mood and Affect: Mood normal.        Behavior: Behavior normal.        Thought Content: Thought content normal.    Assessment & Plan:  1: Chronic heart failure with reduced ejection fraction- - NYHA class II - euvolemic today - weighing daily and he was instructed to call for an overnight weight gain of >2 pounds or a weekly weight gain of >5 pounds - weight down 20 pounds from last visit here 1 month ago - admits to liking the taste of salt but is aware of not adding salt to his food; encouraged him to try using Mrs. Dash, pepper or other seasonings that don't contain salt - saw cardiology 08/02/2020) 07/21/20 - current BP will not allow changing medication to entresto - BNP 07/30/20 was 1204.0 - reports receiving both COVID vaccines  2: HTN- - BP looks good although on the low side - saw PCP 09/29/20) 06/06/20 - BMP 07/30/20 reviewed and showed sodium 133, potassium 3.3, creatinine 1.7 and GFR 39  3: DM- - A1c 06/01/20 was 8.0% - glucose at home this morning was 118  4: Recurrent pleural effusion- - recent right-sided thoracentesis done - saw pulmonology 08/02/20) 07/30/20 - wearing oxygen at 2L   Medication list was reviewed.   Return in 6 months or sooner for any questions/problems before then.

## 2020-08-05 ENCOUNTER — Encounter: Payer: Self-pay | Admitting: Family

## 2020-08-05 ENCOUNTER — Ambulatory Visit: Payer: Medicare Other | Attending: Family | Admitting: Family

## 2020-08-05 ENCOUNTER — Other Ambulatory Visit: Payer: Self-pay

## 2020-08-05 VITALS — BP 111/56 | HR 58 | Resp 16 | Ht 69.0 in | Wt 166.5 lb

## 2020-08-05 DIAGNOSIS — I10 Essential (primary) hypertension: Secondary | ICD-10-CM

## 2020-08-05 DIAGNOSIS — N1831 Chronic kidney disease, stage 3a: Secondary | ICD-10-CM

## 2020-08-05 DIAGNOSIS — M109 Gout, unspecified: Secondary | ICD-10-CM | POA: Insufficient documentation

## 2020-08-05 DIAGNOSIS — Z951 Presence of aortocoronary bypass graft: Secondary | ICD-10-CM | POA: Insufficient documentation

## 2020-08-05 DIAGNOSIS — Z794 Long term (current) use of insulin: Secondary | ICD-10-CM | POA: Insufficient documentation

## 2020-08-05 DIAGNOSIS — Z87891 Personal history of nicotine dependence: Secondary | ICD-10-CM | POA: Insufficient documentation

## 2020-08-05 DIAGNOSIS — E119 Type 2 diabetes mellitus without complications: Secondary | ICD-10-CM | POA: Diagnosis not present

## 2020-08-05 DIAGNOSIS — I482 Chronic atrial fibrillation, unspecified: Secondary | ICD-10-CM | POA: Insufficient documentation

## 2020-08-05 DIAGNOSIS — J90 Pleural effusion, not elsewhere classified: Secondary | ICD-10-CM

## 2020-08-05 DIAGNOSIS — E7800 Pure hypercholesterolemia, unspecified: Secondary | ICD-10-CM | POA: Diagnosis not present

## 2020-08-05 DIAGNOSIS — Z7901 Long term (current) use of anticoagulants: Secondary | ICD-10-CM | POA: Diagnosis not present

## 2020-08-05 DIAGNOSIS — E785 Hyperlipidemia, unspecified: Secondary | ICD-10-CM | POA: Insufficient documentation

## 2020-08-05 DIAGNOSIS — I2510 Atherosclerotic heart disease of native coronary artery without angina pectoris: Secondary | ICD-10-CM | POA: Diagnosis not present

## 2020-08-05 DIAGNOSIS — I110 Hypertensive heart disease with heart failure: Secondary | ICD-10-CM | POA: Insufficient documentation

## 2020-08-05 DIAGNOSIS — I5022 Chronic systolic (congestive) heart failure: Secondary | ICD-10-CM | POA: Diagnosis present

## 2020-08-05 DIAGNOSIS — Z955 Presence of coronary angioplasty implant and graft: Secondary | ICD-10-CM | POA: Diagnosis not present

## 2020-08-05 DIAGNOSIS — Z7982 Long term (current) use of aspirin: Secondary | ICD-10-CM | POA: Insufficient documentation

## 2020-08-05 DIAGNOSIS — Z8249 Family history of ischemic heart disease and other diseases of the circulatory system: Secondary | ICD-10-CM | POA: Diagnosis not present

## 2020-08-05 DIAGNOSIS — Z79899 Other long term (current) drug therapy: Secondary | ICD-10-CM | POA: Diagnosis not present

## 2020-08-05 NOTE — Patient Instructions (Addendum)
Continue weighing daily and call for an overnight weight gain of > 2 pounds or a weekly weight gain of >5 pounds. 

## 2020-08-06 ENCOUNTER — Telehealth: Payer: Self-pay | Admitting: Podiatrist

## 2020-08-06 NOTE — Telephone Encounter (Signed)
Elease Hashimoto with [facility] called the [facility] nurses line for approval for nursing visits for wound care for Mr. Kathe Mariner.   Her call back is [phone]  Thanks!

## 2020-08-07 ENCOUNTER — Other Ambulatory Visit: Payer: Self-pay

## 2020-08-07 ENCOUNTER — Ambulatory Visit (INDEPENDENT_AMBULATORY_CARE_PROVIDER_SITE_OTHER): Payer: Medicare Other | Admitting: Podiatry

## 2020-08-07 DIAGNOSIS — E1142 Type 2 diabetes mellitus with diabetic polyneuropathy: Secondary | ICD-10-CM

## 2020-08-07 DIAGNOSIS — L97922 Non-pressure chronic ulcer of unspecified part of left lower leg with fat layer exposed: Secondary | ICD-10-CM | POA: Diagnosis not present

## 2020-08-07 NOTE — Telephone Encounter (Signed)
Ok- I called and let Elease Hashimoto know-  she will keep his case open for a couple weeks just in case he needs to resume dressing changes

## 2020-08-07 NOTE — Telephone Encounter (Signed)
He wont need to change the dressing as he is undergoing tides medical graft once a week.

## 2020-08-08 ENCOUNTER — Encounter: Payer: Self-pay | Admitting: Podiatry

## 2020-08-08 NOTE — Progress Notes (Signed)
Subjective:  Patient ID: Jeremiah Chapman, male    DOB: 1946-05-28,  MRN: [id number]  No chief complaint on file.   74 y.o. male presents for wound care.  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  Patient states that he had an acute onset of new ulceration to the lateral proximal leg.  He would like to discuss treatment options.  He was approved for tides medical graft.  We will begin the application right away.   Review of Systems: Negative except as noted in the HPI. Denies N/V/F/Ch.  Past Medical History:  Diagnosis Date  . Atrial fibrillation (HCC)   . CAD (coronary artery disease)   . CHF (congestive heart failure) (HCC)   . Diabetes mellitus without complication (HCC)   . Gout   . Hypercholesterolemia   . Hypertension   . Pleural effusion     Current Outpatient Medications:  .  albuterol (VENTOLIN HFA) 108 (90 Base) MCG/ACT inhaler, Inhale 2 puffs into the lungs every 6 (six) hours as needed. , Disp: , Rfl:  .  apixaban (ELIQUIS) 5 MG TABS tablet, Take 5 mg by mouth every 12 (twelve) hours. , Disp: , Rfl:  .  aspirin 81 MG EC tablet, Take 81 mg by mouth daily. , Disp: , Rfl:  .  atorvastatin (LIPITOR) 80 MG tablet, TAKE 1 TABLET BY MOUTH EVERY DAY, Disp: , Rfl:  .  digoxin (LANOXIN) 0.125 MG tablet, TAKE 1 TABLET BY MOUTH EVERY DAY, Disp: , Rfl:  .  doxycycline (VIBRA-TABS) 100 MG tablet, Take 1 tablet (100 mg total) by mouth 2 (two) times daily., Disp: 20 tablet, Rfl: 0 .  fluticasone (FLONASE) 50 MCG/ACT nasal spray, Place into the nose. Place 2 sprays into both nostrils once daily, Disp: , Rfl:  .  furosemide (LASIX) 20 MG tablet, Take 2 tablets (40 mg total) by mouth 2 (two) times daily., Disp: 30 tablet, Rfl: 0 .  gabapentin (NEURONTIN) 100 MG capsule, Take 100-300 mg by mouth See admin instructions. Take 1 capsule (100mg ) by mouth at dinnertime and take 3 capsules (300mg ) by mouth at bedtime, Disp: , Rfl:  .  Insulin Degludec 200 UNIT/ML SOPN, Inject 8 Units  into the skin daily., Disp: 0.5 mL, Rfl: 0 .  irbesartan (AVAPRO) 300 MG tablet, TAKE 1 TABLET BY MOUTH EVERY DAY, Disp: , Rfl:  .  loratadine (CLARITIN REDITABS) 10 MG dissolvable tablet, Take by mouth., Disp: , Rfl:  .  metFORMIN (GLUCOPHAGE-XR) 500 MG 24 hr tablet, Take 1,000 mg by mouth 2 (two) times daily. , Disp: , Rfl:  .  metoprolol succinate (TOPROL-XL) 25 MG 24 hr tablet, Take 12.5 mg by mouth daily. , Disp: , Rfl:  .  polyethylene glycol (MIRALAX / GLYCOLAX) 17 g packet, Take 17 g by mouth daily., Disp: 14 each, Rfl: 0 .  potassium chloride (KLOR-CON) 10 MEQ tablet, Take 1 tablet (10 mEq total) by mouth daily., Disp: 60 tablet, Rfl: 0 .  senna-docusate (SENOKOT-S) 8.6-50 MG tablet, Take 2 tablets by mouth at bedtime., Disp: 10 tablet, Rfl: 0 .  triamcinolone cream (KENALOG) 0.1 %, Apply 1 application topically 2 (two) times daily as needed (skin irritations). , Disp: , Rfl:   Social History   Tobacco Use  Smoking Status Former Smoker  . Packs/day: 0.50  . Years: 50.00  . Pack years: 25.00  . Types: Cigarettes  Smokeless Tobacco Current User    Allergies  Allergen Reactions  . Lactose Intolerance (Gi)   .  Sitagliptin Rash and Other (See Comments)    Januvia   Objective:  There were no vitals filed for this visit. There is no height or weight on file to calculate BMI. Constitutional Well developed. Well nourished.  Vascular Dorsalis pedis pulses palpable bilaterally. Posterior tibial pulses palpable bilaterally. Capillary refill normal to all digits.  No cyanosis or clubbing noted. Pedal hair growth normal.  Neurologic Normal speech. Oriented to person, place, and time. Protective sensation absent  Dermatologic Wound Location: Left posterior leg and left lateral leg with fat layer exposed.  Does not probe down to bone.  No cellulitis or erythema noted.  No malodor present. Wound Base: Mixed Granular/Fibrotic Peri-wound: Normal Exudate: Scant/small amount Serous  exudate Wound Measurements: -See below  Orthopedic: No pain to palpation either foot.   Radiographs: None Assessment:   1. Leg ulcer, left, with fat layer exposed (HCC)   2. Diabetic polyneuropathy associated with type 2 diabetes mellitus (HCC)    Plan:  Patient was evaluated and treated and all questions answered.  Ulcer left arterial leg ulcer x3 with fat layer exposed -Debridement as below. -Dressed with Betadine wet-to-dry, DSD. -Continue off-loading with surgical shoe. -ABIs PVR showed adequate flow to the lower extremity to allow for these ulcerations to heal.  Given that patient has good flow I will discuss with her about graft application. -Patient was approved for tides medical graft.  Past medical graft was applied in standard technique without complication.  We will continue to monitor the progression of it.  Procedure: Excisional Debridement of Wound posterior leg~stagnant Tool: Sharp chisel blade/tissue nipper Rationale: Removal of non-viable soft tissue from the wound to promote healing.  Anesthesia: none Pre-Debridement Wound Measurements: 3 cm x 3 cm x 0.3 cm Post-Debridement Wound Measurements: 3.2 cm x 3.1 cm x 0.3 cm Type of Debridement: Sharp Excisional Tissue Removed: Non-viable soft tissue Blood loss: Minimal (<50cc) Depth of Debridement: subcutaneous tissue. Technique: Sharp excisional debridement to bleeding, viable wound base.  Wound Progress: The wound is regressing with continued application of tides medical graft. Dressing: Dry, sterile, compression dressing. Disposition: Patient tolerated procedure well. Patient to return in 1 week for follow-up.  And  Procedure: Excisional Debridement of Wound posterior leg~stagnant Tool: Sharp chisel blade/tissue nipper Rationale: Removal of non-viable soft tissue from the wound to promote healing.  Anesthesia: none Pre-Debridement Wound Measurements: 1.5 cm x 1.5 cm x 0.3 cm Post-Debridement Wound Measurements:  1.6 cm x 1.6 cm x 0.3 cm Type of Debridement: Sharp Excisional Tissue Removed: Non-viable soft tissue Blood loss: Minimal (<50cc) Depth of Debridement: subcutaneous tissue. Technique: Sharp excisional debridement to bleeding, viable wound base.  Wound Progress: The wound is regressing with continued application of past medical graft Dressing: Dry, sterile, compression dressing. Disposition: Patient tolerated procedure well. Patient to return in 1 week for follow-up.  Procedure: Excisional Debridement of Wound anterior proximal leg~stagnant Tool: Sharp chisel blade/tissue nipper Rationale: Removal of non-viable soft tissue from the wound to promote healing.  Anesthesia: none Pre-Debridement Wound Measurements: 5 cm x 2 cm x 0.3 cm Post-Debridement Wound Measurements: 5.1 cm x 2.1 cm x 0.3 cm Type of Debridement: Sharp Excisional Tissue Removed: Non-viable soft tissue Blood loss: Minimal (<50cc) Depth of Debridement: subcutaneous tissue. Technique: Sharp excisional debridement to bleeding, viable wound base.  Wound Progress: The wound has been stagnant. Dressing: Dry, sterile, compression dressing. Disposition: Patient tolerated procedure well. Patient to return in 1 week for follow-up.  No follow-ups on file.

## 2020-08-14 ENCOUNTER — Ambulatory Visit (INDEPENDENT_AMBULATORY_CARE_PROVIDER_SITE_OTHER): Payer: Medicare Other | Admitting: Podiatry

## 2020-08-14 ENCOUNTER — Other Ambulatory Visit: Payer: Self-pay

## 2020-08-14 ENCOUNTER — Encounter: Payer: Self-pay | Admitting: Podiatry

## 2020-08-14 DIAGNOSIS — E1142 Type 2 diabetes mellitus with diabetic polyneuropathy: Secondary | ICD-10-CM

## 2020-08-14 DIAGNOSIS — L97922 Non-pressure chronic ulcer of unspecified part of left lower leg with fat layer exposed: Secondary | ICD-10-CM | POA: Diagnosis not present

## 2020-08-14 MED ORDER — DOXYCYCLINE HYCLATE 100 MG PO TABS: 100.0000 mg | ORAL_TABLET | Freq: Two times a day (BID) | ORAL | 0 refills | Status: AC

## 2020-08-15 ENCOUNTER — Encounter: Payer: Self-pay | Admitting: Podiatry

## 2020-08-15 NOTE — Progress Notes (Signed)
Subjective:  Patient ID: Jeremiah Chapman, male    DOB: 1946/06/22,  MRN: [id number]  Chief Complaint  Patient presents with  . Wound Check    wound is looking better, but still painful    74 y.o. male presents for wound care.  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  Patient states that he had an acute onset of new ulceration to the lateral proximal leg.  He would like to discuss treatment options.  He was approved for tides medical graft.  We will begin the application right away.   Review of Systems: Negative except as noted in the HPI. Denies N/V/F/Ch.  Past Medical History:  Diagnosis Date  . Atrial fibrillation (HCC)   . CAD (coronary artery disease)   . CHF (congestive heart failure) (HCC)   . Diabetes mellitus without complication (HCC)   . Gout   . Hypercholesterolemia   . Hypertension   . Pleural effusion     Current Outpatient Medications:  .  albuterol (VENTOLIN HFA) 108 (90 Base) MCG/ACT inhaler, Inhale 2 puffs into the lungs every 6 (six) hours as needed. , Disp: , Rfl:  .  apixaban (ELIQUIS) 5 MG TABS tablet, Take 5 mg by mouth every 12 (twelve) hours. , Disp: , Rfl:  .  aspirin 81 MG EC tablet, Take 81 mg by mouth daily. , Disp: , Rfl:  .  atorvastatin (LIPITOR) 80 MG tablet, TAKE 1 TABLET BY MOUTH EVERY DAY, Disp: , Rfl:  .  digoxin (LANOXIN) 0.125 MG tablet, TAKE 1 TABLET BY MOUTH EVERY DAY, Disp: , Rfl:  .  doxycycline (VIBRA-TABS) 100 MG tablet, Take 1 tablet (100 mg total) by mouth 2 (two) times daily., Disp: 60 tablet, Rfl: 0 .  fluticasone (FLONASE) 50 MCG/ACT nasal spray, Place into the nose. Place 2 sprays into both nostrils once daily, Disp: , Rfl:  .  furosemide (LASIX) 20 MG tablet, Take 2 tablets (40 mg total) by mouth 2 (two) times daily., Disp: 30 tablet, Rfl: 0 .  gabapentin (NEURONTIN) 100 MG capsule, Take 100-300 mg by mouth See admin instructions. Take 1 capsule (100mg ) by mouth at dinnertime and take 3 capsules (300mg ) by mouth at  bedtime, Disp: , Rfl:  .  Insulin Degludec 200 UNIT/ML SOPN, Inject 8 Units into the skin daily., Disp: 0.5 mL, Rfl: 0 .  irbesartan (AVAPRO) 300 MG tablet, TAKE 1 TABLET BY MOUTH EVERY DAY, Disp: , Rfl:  .  loratadine (CLARITIN REDITABS) 10 MG dissolvable tablet, Take by mouth., Disp: , Rfl:  .  metFORMIN (GLUCOPHAGE-XR) 500 MG 24 hr tablet, Take 1,000 mg by mouth 2 (two) times daily. , Disp: , Rfl:  .  metoprolol succinate (TOPROL-XL) 25 MG 24 hr tablet, Take 12.5 mg by mouth daily. , Disp: , Rfl:  .  polyethylene glycol (MIRALAX / GLYCOLAX) 17 g packet, Take 17 g by mouth daily., Disp: 14 each, Rfl: 0 .  potassium chloride (KLOR-CON) 10 MEQ tablet, Take 1 tablet (10 mEq total) by mouth daily., Disp: 60 tablet, Rfl: 0 .  senna-docusate (SENOKOT-S) 8.6-50 MG tablet, Take 2 tablets by mouth at bedtime., Disp: 10 tablet, Rfl: 0 .  triamcinolone cream (KENALOG) 0.1 %, Apply 1 application topically 2 (two) times daily as needed (skin irritations). , Disp: , Rfl:   Social History   Tobacco Use  Smoking Status Former Smoker  . Packs/day: 0.50  . Years: 50.00  . Pack years: 25.00  . Types: Cigarettes  Smokeless Tobacco Current  User    Allergies  Allergen Reactions  . Lactose Intolerance (Gi)   . Sitagliptin Rash and Other (See Comments)    Januvia   Objective:  There were no vitals filed for this visit. There is no height or weight on file to calculate BMI. Constitutional Well developed. Well nourished.  Vascular Dorsalis pedis pulses palpable bilaterally. Posterior tibial pulses palpable bilaterally. Capillary refill normal to all digits.  No cyanosis or clubbing noted. Pedal hair growth normal.  Neurologic Normal speech. Oriented to person, place, and time. Protective sensation absent  Dermatologic Wound Location: Left posterior leg and left lateral leg with fat layer exposed.  Does not probe down to bone.  No cellulitis or erythema noted.  No malodor present. Wound Base: Mixed  Granular/Fibrotic Peri-wound: Normal Exudate: Scant/small amount Serous exudate Wound Measurements: -See below  Orthopedic: No pain to palpation either foot.   Radiographs: None Assessment:   1. Leg ulcer, left, with fat layer exposed (HCC)   2. Diabetic polyneuropathy associated with type 2 diabetes mellitus (HCC)    Plan:  Patient was evaluated and treated and all questions answered.  Ulcer left arterial leg ulcer x3 with fat layer exposed -Debridement as below. -Dressed with Betadine wet-to-dry, DSD. -Continue off-loading with surgical shoe. -ABIs PVR showed adequate flow to the lower extremity to allow for these ulcerations to heal.  Given that patient has good flow I will discuss with her about graft application. -Patient was approved for tides medical graft.  Past medical graft was applied in standard technique without complication.  We will continue to monitor the progression of it.  Procedure: Excisional Debridement of Wound posterior leg~decreasing Tool: Sharp chisel blade/tissue nipper Rationale: Removal of non-viable soft tissue from the wound to promote healing.  Anesthesia: none Pre-Debridement Wound Measurements: 2.5 cm x 2.5 cm x 0.2 cm Post-Debridement Wound Measurements: 2.7 cm x 2.5 cm x 0.3 cm Type of Debridement: Sharp Excisional Tissue Removed: Non-viable soft tissue Blood loss: Minimal (<50cc) Depth of Debridement: subcutaneous tissue. Technique: Sharp excisional debridement to bleeding, viable wound base.  Wound Progress: The wound is regressing with continued application of tides medical graft. Dressing: Dry, sterile, compression dressing. Disposition: Patient tolerated procedure well. Patient to return in 1 week for follow-up.  And  Procedure: Excisional Debridement of Wound posterior leg~decreasing Tool: Sharp chisel blade/tissue nipper Rationale: Removal of non-viable soft tissue from the wound to promote healing.  Anesthesia: none Pre-Debridement  Wound Measurements: 1.3 cm x 1.2 cm x 0.3 cm Post-Debridement Wound Measurements: 1.4 cm x 1.2 cm x 0.3 cm Type of Debridement: Sharp Excisional Tissue Removed: Non-viable soft tissue Blood loss: Minimal (<50cc) Depth of Debridement: subcutaneous tissue. Technique: Sharp excisional debridement to bleeding, viable wound base.  Wound Progress: The wound is regressing with continued application of tides medical graft Dressing: Dry, sterile, compression dressing. Disposition: Patient tolerated procedure well. Patient to return in 1 week for follow-up.  Procedure: Excisional Debridement of Wound anterior proximal leg~decreasing Tool: Sharp chisel blade/tissue nipper Rationale: Removal of non-viable soft tissue from the wound to promote healing.  Anesthesia: none Pre-Debridement Wound Measurements: 4 cm x 2 cm x 0.3 cm Post-Debridement Wound Measurements: 4.2 cm x 2.2 cm x 0.3 cm Type of Debridement: Sharp Excisional Tissue Removed: Non-viable soft tissue Blood loss: Minimal (<50cc) Depth of Debridement: subcutaneous tissue. Technique: Sharp excisional debridement to bleeding, viable wound base.  Wound Progress: The wound is Degele decreasing/regressing with continued application of tides medical graft Dressing: Dry, sterile, compression dressing. Disposition: Patient  tolerated procedure well. Patient to return in 1 week for follow-up.  No follow-ups on file.

## 2020-08-21 ENCOUNTER — Ambulatory Visit: Payer: Medicare Other | Admitting: Podiatry

## 2020-08-26 ENCOUNTER — Ambulatory Visit (INDEPENDENT_AMBULATORY_CARE_PROVIDER_SITE_OTHER): Payer: Medicare Other | Admitting: Podiatry

## 2020-08-26 ENCOUNTER — Encounter: Payer: Self-pay | Admitting: Podiatry

## 2020-08-26 ENCOUNTER — Other Ambulatory Visit: Payer: Self-pay

## 2020-08-26 DIAGNOSIS — E1142 Type 2 diabetes mellitus with diabetic polyneuropathy: Secondary | ICD-10-CM

## 2020-08-26 DIAGNOSIS — L97922 Non-pressure chronic ulcer of unspecified part of left lower leg with fat layer exposed: Secondary | ICD-10-CM | POA: Diagnosis not present

## 2020-08-27 ENCOUNTER — Encounter: Payer: Self-pay | Admitting: Podiatry

## 2020-08-27 NOTE — Progress Notes (Addendum)
Subjective:  Patient ID: Jeremiah Chapman, male    DOB: [birth year],  MRN: [id number]  Chief Complaint  Patient presents with  . Wound Check    "Im having pain around my ankle"    74 y.o. male presents for wound care.  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  Patient states that he had an acute onset of new ulceration to the lateral proximal leg.  He would like to discuss treatment options.  He was approved for tides medical graft.  We will begin the application right away.   Review of Systems: Negative except as noted in the HPI. Denies N/V/F/Ch.  Past Medical History:  Diagnosis Date  . Atrial fibrillation (HCC)   . CAD (coronary artery disease)   . CHF (congestive heart failure) (HCC)   . Diabetes mellitus without complication (HCC)   . Gout   . Hypercholesterolemia   . Hypertension   . Pleural effusion     Current Outpatient Medications:  .  albuterol (VENTOLIN HFA) 108 (90 Base) MCG/ACT inhaler, Inhale 2 puffs into the lungs every 6 (six) hours as needed. , Disp: , Rfl:  .  apixaban (ELIQUIS) 5 MG TABS tablet, Take 5 mg by mouth every 12 (twelve) hours. , Disp: , Rfl:  .  aspirin 81 MG EC tablet, Take 81 mg by mouth daily. , Disp: , Rfl:  .  atorvastatin (LIPITOR) 80 MG tablet, TAKE 1 TABLET BY MOUTH EVERY DAY, Disp: , Rfl:  .  digoxin (LANOXIN) 0.125 MG tablet, TAKE 1 TABLET BY MOUTH EVERY DAY, Disp: , Rfl:  .  doxycycline (VIBRA-TABS) 100 MG tablet, Take 1 tablet (100 mg total) by mouth 2 (two) times daily., Disp: 60 tablet, Rfl: 0 .  fluticasone (FLONASE) 50 MCG/ACT nasal spray, Place into the nose. Place 2 sprays into both nostrils once daily, Disp: , Rfl:  .  furosemide (LASIX) 20 MG tablet, Take 2 tablets (40 mg total) by mouth 2 (two) times daily., Disp: 30 tablet, Rfl: 0 .  gabapentin (NEURONTIN) 100 MG capsule, Take 100-300 mg by mouth See admin instructions. Take 1 capsule (100mg ) by mouth at dinnertime and take 3 capsules (300mg ) by mouth at bedtime,  Disp: , Rfl:  .  Insulin Degludec 200 UNIT/ML SOPN, Inject 8 Units into the skin daily., Disp: 0.5 mL, Rfl: 0 .  irbesartan (AVAPRO) 300 MG tablet, TAKE 1 TABLET BY MOUTH EVERY DAY, Disp: , Rfl:  .  loratadine (CLARITIN REDITABS) 10 MG dissolvable tablet, Take by mouth., Disp: , Rfl:  .  metFORMIN (GLUCOPHAGE-XR) 500 MG 24 hr tablet, Take 1,000 mg by mouth 2 (two) times daily. , Disp: , Rfl:  .  metoprolol succinate (TOPROL-XL) 25 MG 24 hr tablet, Take 12.5 mg by mouth daily. , Disp: , Rfl:  .  polyethylene glycol (MIRALAX / GLYCOLAX) 17 g packet, Take 17 g by mouth daily., Disp: 14 each, Rfl: 0 .  potassium chloride (KLOR-CON) 10 MEQ tablet, Take 1 tablet (10 mEq total) by mouth daily., Disp: 60 tablet, Rfl: 0 .  senna-docusate (SENOKOT-S) 8.6-50 MG tablet, Take 2 tablets by mouth at bedtime., Disp: 10 tablet, Rfl: 0 .  triamcinolone cream (KENALOG) 0.1 %, Apply 1 application topically 2 (two) times daily as needed (skin irritations). , Disp: , Rfl:   Social History   Tobacco Use  Smoking Status Former Smoker  . Packs/day: 0.50  . Years: 50.00  . Pack years: 25.00  . Types: Cigarettes  Smokeless Tobacco Current User  Allergies  Allergen Reactions  . Lactose Intolerance (Gi)   . Sitagliptin Rash and Other (See Comments)    Januvia   Objective:  There were no vitals filed for this visit. There is no height or weight on file to calculate BMI. Constitutional Well developed. Well nourished.  Vascular Dorsalis pedis pulses palpable bilaterally. Posterior tibial pulses palpable bilaterally. Capillary refill normal to all digits.  No cyanosis or clubbing noted. Pedal hair growth normal.  Neurologic Normal speech. Oriented to person, place, and time. Protective sensation absent  Dermatologic Wound Location: Left posterior leg and left lateral leg with fat layer exposed.  Does not probe down to bone.  No cellulitis or erythema noted.  No malodor present. Wound Base: Mixed  Granular/Fibrotic Peri-wound: Normal Exudate: Scant/small amount Serous exudate Wound Measurements: -See below  Orthopedic: No pain to palpation either foot.   Radiographs: None Assessment:   1. Leg ulcer, left, with fat layer exposed (HCC)   2. Diabetic polyneuropathy associated with type 2 diabetes mellitus (HCC)    Plan:  Patient was evaluated and treated and all questions answered.  Ulcer left arterial leg ulcer x3 with fat layer exposed -Debridement as below. -Dressed with Betadine wet-to-dry, DSD. -Continue off-loading with surgical shoe. -ABIs PVR showed adequate flow to the lower extremity to allow for these ulcerations to heal.  Given that patient has good flow I will discuss with her about graft application. -Patient was approved for tides medical graft.  Past medical graft was applied in standard technique without complication.  We will continue to monitor the progression of it.  Procedure: Excisional Debridement of Wound posterior leg  with application of skin substitute/skin graft~decreasing Tool: Sharp chisel blade/tissue nipper Rationale: Removal of non-viable soft tissue from the wound to promote healing.  Anesthesia: none Pre-Debridement Wound Measurements: 0.5 cm x 0.3 cm x 0.3 cm Post-Debridement Wound Measurements: 0.7 cm x 0.3 cm x 0.3 cm Type of Debridement: Sharp Excisional Tissue Removed: Non-viable soft tissue Blood loss: Minimal (<50cc) Depth of Debridement: subcutaneous tissue. Technique: Sharp excisional debridement to bleeding, viable wound base.  Wound Progress: The wound is regressing with continued application of tides medical graft. Dressing: Dry, sterile, compression dressing. Disposition: Patient tolerated procedure well. Patient to return in 1 week for follow-up.  And  Procedure: Excisional Debridement of Wound posterior leg  with application of skin substitute/skin graft~decreasing Tool: Sharp chisel blade/tissue nipper Rationale: Removal  of non-viable soft tissue from the wound to promote healing.  Anesthesia: none Pre-Debridement Wound Measurements: 2.5 cm x 2.3 cm x 0.3 cm Post-Debridement Wound Measurements: 2.6 cm  x 2.4 cm  x 0.3 cm Type of Debridement: Sharp Excisional Tissue Removed: Non-viable soft tissue Blood loss: Minimal (<50cc) Depth of Debridement: subcutaneous tissue. Technique: Sharp excisional debridement to bleeding, viable wound base.  Wound Progress: The wound is regressing with continued application of tides medical graft Dressing: Dry, sterile, compression dressing. Disposition: Patient tolerated procedure well. Patient to return in 1 week for follow-up.  Procedure: Excisional Debridement of Wound anterior proximal leg  with application of skin substitute/skin graft~decreasing Tool: Sharp chisel blade/tissue nipper Rationale: Removal of non-viable soft tissue from the wound to promote healing.  Anesthesia: none Pre-Debridement Wound Measurements: 2 cm x 1 cm x 0.3 cm Post-Debridement Wound Measurements: 2.2 cm x 1.2 cm x 0.3 cm Type of Debridement: Sharp Excisional Tissue Removed: Non-viable soft tissue Blood loss: Minimal (<50cc) Depth of Debridement: subcutaneous tissue. Technique: Sharp excisional debridement to bleeding, viable wound base.  Wound Progress:  The wound is Degele decreasing/regressing with continued application of tides medical graft Dressing: Dry, sterile, compression dressing. Disposition: Patient tolerated procedure well. Patient to return in 1 week for follow-up.  No follow-ups on file.

## 2020-09-04 ENCOUNTER — Ambulatory Visit (INDEPENDENT_AMBULATORY_CARE_PROVIDER_SITE_OTHER): Payer: Medicare Other | Admitting: Podiatry

## 2020-09-04 ENCOUNTER — Encounter: Payer: Self-pay | Admitting: Podiatry

## 2020-09-04 ENCOUNTER — Other Ambulatory Visit: Payer: Self-pay

## 2020-09-04 DIAGNOSIS — L97922 Non-pressure chronic ulcer of unspecified part of left lower leg with fat layer exposed: Secondary | ICD-10-CM | POA: Diagnosis not present

## 2020-09-04 DIAGNOSIS — E1142 Type 2 diabetes mellitus with diabetic polyneuropathy: Secondary | ICD-10-CM | POA: Diagnosis not present

## 2020-09-04 NOTE — Progress Notes (Addendum)
Subjective:  Patient ID: Jeremiah Chapman, male    DOB: 1946-11-28,  MRN: [id number]  Chief Complaint  Patient presents with  . Wound Check    wound dressing change left leg.   He is now concerned about RLE, new small wound and rash, but denies any pain or itching,  He noticed about 1 week ago    74 y.o. male presents for wound care.  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  Patient states that 2 of these ulcers have completely healed.  The past medical grafts are helping considerably.  He now only has 1 ulcerations to the left posterior leg.   Review of Systems: Negative except as noted in the HPI. Denies N/V/F/Ch.  Past Medical History:  Diagnosis Date  . Atrial fibrillation (HCC)   . CAD (coronary artery disease)   . CHF (congestive heart failure) (HCC)   . Diabetes mellitus without complication (HCC)   . Gout   . Hypercholesterolemia   . Hypertension   . Pleural effusion     Current Outpatient Medications:  .  albuterol (VENTOLIN HFA) 108 (90 Base) MCG/ACT inhaler, Inhale 2 puffs into the lungs every 6 (six) hours as needed. , Disp: , Rfl:  .  apixaban (ELIQUIS) 5 MG TABS tablet, Take 5 mg by mouth every 12 (twelve) hours. , Disp: , Rfl:  .  aspirin 81 MG EC tablet, Take 81 mg by mouth daily. , Disp: , Rfl:  .  atorvastatin (LIPITOR) 80 MG tablet, TAKE 1 TABLET BY MOUTH EVERY DAY, Disp: , Rfl:  .  digoxin (LANOXIN) 0.125 MG tablet, TAKE 1 TABLET BY MOUTH EVERY DAY, Disp: , Rfl:  .  doxycycline (VIBRA-TABS) 100 MG tablet, Take 1 tablet (100 mg total) by mouth 2 (two) times daily., Disp: 60 tablet, Rfl: 0 .  fluticasone (FLONASE) 50 MCG/ACT nasal spray, Place into the nose. Place 2 sprays into both nostrils once daily, Disp: , Rfl:  .  furosemide (LASIX) 20 MG tablet, Take 2 tablets (40 mg total) by mouth 2 (two) times daily., Disp: 30 tablet, Rfl: 0 .  gabapentin (NEURONTIN) 100 MG capsule, Take 100-300 mg by mouth See admin instructions. Take 1 capsule (100mg ) by  mouth at dinnertime and take 3 capsules (300mg ) by mouth at bedtime, Disp: , Rfl:  .  Insulin Degludec 200 UNIT/ML SOPN, Inject 8 Units into the skin daily., Disp: 0.5 mL, Rfl: 0 .  irbesartan (AVAPRO) 300 MG tablet, TAKE 1 TABLET BY MOUTH EVERY DAY, Disp: , Rfl:  .  loratadine (CLARITIN REDITABS) 10 MG dissolvable tablet, Take by mouth., Disp: , Rfl:  .  metFORMIN (GLUCOPHAGE-XR) 500 MG 24 hr tablet, Take 1,000 mg by mouth 2 (two) times daily. , Disp: , Rfl:  .  metoprolol succinate (TOPROL-XL) 25 MG 24 hr tablet, Take 12.5 mg by mouth daily. , Disp: , Rfl:  .  polyethylene glycol (MIRALAX / GLYCOLAX) 17 g packet, Take 17 g by mouth daily., Disp: 14 each, Rfl: 0 .  potassium chloride (KLOR-CON) 10 MEQ tablet, Take 1 tablet (10 mEq total) by mouth daily., Disp: 60 tablet, Rfl: 0 .  senna-docusate (SENOKOT-S) 8.6-50 MG tablet, Take 2 tablets by mouth at bedtime., Disp: 10 tablet, Rfl: 0 .  triamcinolone cream (KENALOG) 0.1 %, Apply 1 application topically 2 (two) times daily as needed (skin irritations). , Disp: , Rfl:   Social History   Tobacco Use  Smoking Status Former Smoker  . Packs/day: 0.50  . Years:  50.00  . Pack years: 25.00  . Types: Cigarettes  Smokeless Tobacco Current User    Allergies  Allergen Reactions  . Lactose Intolerance (Gi)   . Sitagliptin Rash and Other (See Comments)    Januvia   Objective:  There were no vitals filed for this visit. There is no height or weight on file to calculate BMI. Constitutional Well developed. Well nourished.  Vascular Dorsalis pedis pulses palpable bilaterally. Posterior tibial pulses palpable bilaterally. Capillary refill normal to all digits.  No cyanosis or clubbing noted. Pedal hair growth normal.  Neurologic Normal speech. Oriented to person, place, and time. Protective sensation absent  Dermatologic Wound Location: Left posterior leg and left lateral leg with fat layer exposed.  Does not probe down to bone.  No cellulitis  or erythema noted.  No malodor present. Wound Base: Mixed Granular/Fibrotic Peri-wound: Normal Exudate: Scant/small amount Serous exudate Wound Measurements: -See below  Orthopedic: No pain to palpation either foot.   Radiographs: None Assessment:   1. Leg ulcer, left, with fat layer exposed (HCC)   2. Diabetic polyneuropathy associated with type 2 diabetes mellitus (HCC)    Plan:  Patient was evaluated and treated and all questions answered.  Ulcer left arterial leg ulcer x1 with fat layer exposed now with 2 ulcers completely reepithelialized -Debridement as below.. -Continue off-loading with surgical shoe. -ABIs PVR showed adequate flow to the lower extremity to allow for these ulcerations to heal.  Given that patient has good flow I will discuss with her about graft application. -Patient was approved for tides medical graft.  Past medical graft was applied in standard technique without complication.  We will continue to monitor the progression of it.   Procedure: Excisional Debridement of Wound posterior leg  with application of skin substitute/skin graft~decreasing Tool: Sharp chisel blade/tissue nipper Rationale: Removal of non-viable soft tissue from the wound to promote healing.  Anesthesia: none Pre-Debridement Wound Measurements: 2.2 cm  x 2.3 cm  x 0.3 cm Post-Debridement Wound Measurements: 2.3 cm  x 2.4 cm  x 0.3 cm Type of Debridement: Sharp Excisional Tissue Removed: Non-viable soft tissue Blood loss: Minimal (<50cc) Depth of Debridement: subcutaneous tissue. Technique: Sharp excisional debridement to bleeding, viable wound base.  Wound Progress: The wound is regressing with continued application of tides medical graft Dressing: Dry, sterile, compression dressing. Disposition: Patient tolerated procedure well. Patient to return in 1 week for follow-up.    No follow-ups on file.

## 2020-09-11 ENCOUNTER — Ambulatory Visit (INDEPENDENT_AMBULATORY_CARE_PROVIDER_SITE_OTHER): Payer: Medicare Other | Admitting: Podiatry

## 2020-09-11 ENCOUNTER — Other Ambulatory Visit: Payer: Self-pay

## 2020-09-11 ENCOUNTER — Encounter: Payer: Self-pay | Admitting: Podiatry

## 2020-09-11 DIAGNOSIS — L97922 Non-pressure chronic ulcer of unspecified part of left lower leg with fat layer exposed: Secondary | ICD-10-CM

## 2020-09-11 DIAGNOSIS — E1142 Type 2 diabetes mellitus with diabetic polyneuropathy: Secondary | ICD-10-CM

## 2020-09-11 NOTE — Progress Notes (Addendum)
Subjective:  Patient ID: Jeremiah Chapman, male    DOB: 01-Aug-1946,  MRN: [id number]  Chief Complaint  Patient presents with   Wound Check    "I think its doing better"    74 y.o. male presents for wound care.  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  Patient states that 2 of these ulcers have completely healed.  The past medical grafts are helping considerably.  He now only has 1 ulcerations to the left posterior leg.   Review of Systems: Negative except as noted in the HPI. Denies N/V/F/Ch.  Past Medical History:  Diagnosis Date   Atrial fibrillation (HCC)    CAD (coronary artery disease)    CHF (congestive heart failure) (HCC)    Diabetes mellitus without complication (HCC)    Gout    Hypercholesterolemia    Hypertension    Pleural effusion     Current Outpatient Medications:    albuterol (VENTOLIN HFA) 108 (90 Base) MCG/ACT inhaler, Inhale 2 puffs into the lungs every 6 (six) hours as needed. , Disp: , Rfl:    apixaban (ELIQUIS) 5 MG TABS tablet, Take 5 mg by mouth every 12 (twelve) hours. , Disp: , Rfl:    aspirin 81 MG EC tablet, Take 81 mg by mouth daily. , Disp: , Rfl:    atorvastatin (LIPITOR) 80 MG tablet, TAKE 1 TABLET BY MOUTH EVERY DAY, Disp: , Rfl:    digoxin (LANOXIN) 0.125 MG tablet, TAKE 1 TABLET BY MOUTH EVERY DAY, Disp: , Rfl:    doxycycline (VIBRA-TABS) 100 MG tablet, Take 1 tablet (100 mg total) by mouth 2 (two) times daily., Disp: 60 tablet, Rfl: 0   fluticasone (FLONASE) 50 MCG/ACT nasal spray, Place into the nose. Place 2 sprays into both nostrils once daily, Disp: , Rfl:    furosemide (LASIX) 20 MG tablet, Take 2 tablets (40 mg total) by mouth 2 (two) times daily., Disp: 30 tablet, Rfl: 0   gabapentin (NEURONTIN) 100 MG capsule, Take 100-300 mg by mouth See admin instructions. Take 1 capsule (100mg ) by mouth at dinnertime and take 3 capsules (300mg ) by mouth at bedtime, Disp: , Rfl:    Insulin Degludec 200 UNIT/ML SOPN,  Inject 8 Units into the skin daily., Disp: 0.5 mL, Rfl: 0   irbesartan (AVAPRO) 300 MG tablet, TAKE 1 TABLET BY MOUTH EVERY DAY, Disp: , Rfl:    loratadine (CLARITIN REDITABS) 10 MG dissolvable tablet, Take by mouth., Disp: , Rfl:    metFORMIN (GLUCOPHAGE-XR) 500 MG 24 hr tablet, Take 1,000 mg by mouth 2 (two) times daily. , Disp: , Rfl:    metoprolol succinate (TOPROL-XL) 25 MG 24 hr tablet, Take 12.5 mg by mouth daily. , Disp: , Rfl:    polyethylene glycol (MIRALAX / GLYCOLAX) 17 g packet, Take 17 g by mouth daily., Disp: 14 each, Rfl: 0   potassium chloride (KLOR-CON) 10 MEQ tablet, Take 1 tablet (10 mEq total) by mouth daily., Disp: 60 tablet, Rfl: 0   senna-docusate (SENOKOT-S) 8.6-50 MG tablet, Take 2 tablets by mouth at bedtime., Disp: 10 tablet, Rfl: 0   triamcinolone cream (KENALOG) 0.1 %, Apply 1 application topically 2 (two) times daily as needed (skin irritations). , Disp: , Rfl:   Social History   Tobacco Use  Smoking Status Former Smoker   Packs/day: 0.50   Years: 50.00   Pack years: 25.00   Types: Cigarettes  Smokeless Tobacco Current User    Allergies  Allergen Reactions   Lactose Intolerance (  Gi)    Sitagliptin Rash and Other (See Comments)    Januvia   Objective:  There were no vitals filed for this visit. There is no height or weight on file to calculate BMI. Constitutional Well developed. Well nourished.  Vascular Dorsalis pedis pulses palpable bilaterally. Posterior tibial pulses palpable bilaterally. Capillary refill normal to all digits.  No cyanosis or clubbing noted. Pedal hair growth normal.  Neurologic Normal speech. Oriented to person, place, and time. Protective sensation absent  Dermatologic Wound Location: Left posterior leg and left lateral leg with fat layer exposed.  Does not probe down to bone.  No cellulitis or erythema noted.  No malodor present. Wound Base: Mixed Granular/Fibrotic Peri-wound: Normal Exudate: Scant/small  amount Serous exudate Wound Measurements: -See below  Orthopedic: No pain to palpation either foot.   Radiographs: None Assessment:   No diagnosis found. Plan:  Patient was evaluated and treated and all questions answered.  Ulcer left arterial leg ulcer x1 with fat layer exposed now with 1 ulcer completely reepithelialized -Debridement as below.. -Continue off-loading with surgical shoe. -ABIs PVR showed adequate flow to the lower extremity to allow for these ulcerations to heal.  Given that patient has good flow I will discuss with her about graft application. -Patient was approved for tides medical graft.  Past medical graft was applied in standard technique without complication.  We will continue to monitor the progression of it.   Procedure: Excisional Debridement of Wound posterior leg  with application of skin substitute/skin graft~decreasing Tool: Sharp chisel blade/tissue nipper Rationale: Removal of non-viable soft tissue from the wound to promote healing.  Anesthesia: none Pre-Debridement Wound Measurements: 2.1 cm  x 2.1 cm  x 0.3 cm Post-Debridement Wound Measurements: 2.2 cm  x 2.2 cm  x 0.3 cm Type of Debridement: Sharp Excisional Tissue Removed: Non-viable soft tissue Blood loss: Minimal (<50cc) Depth of Debridement: subcutaneous tissue. Technique: Sharp excisional debridement to bleeding, viable wound base.  Wound Progress: The wound is regressing with continued application of tides medical graft Dressing: Dry, sterile, compression dressing. Disposition: Patient tolerated procedure well. Patient to return in 1 week for follow-up.    Return in about 1 week (around 09/18/2020).

## 2020-09-17 ENCOUNTER — Ambulatory Visit: Payer: Medicare Other | Admitting: Podiatry

## 2020-09-23 ENCOUNTER — Ambulatory Visit (INDEPENDENT_AMBULATORY_CARE_PROVIDER_SITE_OTHER): Payer: Medicare Other | Admitting: Podiatry

## 2020-09-23 ENCOUNTER — Encounter: Payer: Self-pay | Admitting: Podiatry

## 2020-09-23 ENCOUNTER — Other Ambulatory Visit: Payer: Self-pay

## 2020-09-23 DIAGNOSIS — E1142 Type 2 diabetes mellitus with diabetic polyneuropathy: Secondary | ICD-10-CM

## 2020-09-23 DIAGNOSIS — L97922 Non-pressure chronic ulcer of unspecified part of left lower leg with fat layer exposed: Secondary | ICD-10-CM | POA: Diagnosis not present

## 2020-09-24 ENCOUNTER — Encounter: Payer: Self-pay | Admitting: Podiatry

## 2020-09-24 NOTE — Progress Notes (Addendum)
Subjective:  Patient ID: Jeremiah Chapman, male    DOB: Jan 04, 1946,  MRN: [id number]  Chief Complaint  Patient presents with  . Wound Check    wound follow up LLE    74 y.o. male presents for wound care.  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  Patient states that 2 of these ulcers have completely healed.  The past medical grafts are helping considerably.  He now only has 1 ulcerations to the left posterior leg.   Review of Systems: Negative except as noted in the HPI. Denies N/V/F/Ch.  Past Medical History:  Diagnosis Date  . Atrial fibrillation (HCC)   . CAD (coronary artery disease)   . CHF (congestive heart failure) (HCC)   . Diabetes mellitus without complication (HCC)   . Gout   . Hypercholesterolemia   . Hypertension   . Pleural effusion     Current Outpatient Medications:  .  albuterol (VENTOLIN HFA) 108 (90 Base) MCG/ACT inhaler, Inhale 2 puffs into the lungs every 6 (six) hours as needed. , Disp: , Rfl:  .  apixaban (ELIQUIS) 5 MG TABS tablet, Take 5 mg by mouth every 12 (twelve) hours. , Disp: , Rfl:  .  aspirin 81 MG EC tablet, Take 81 mg by mouth daily. , Disp: , Rfl:  .  atorvastatin (LIPITOR) 80 MG tablet, TAKE 1 TABLET BY MOUTH EVERY DAY, Disp: , Rfl:  .  digoxin (LANOXIN) 0.125 MG tablet, TAKE 1 TABLET BY MOUTH EVERY DAY, Disp: , Rfl:  .  doxycycline (VIBRA-TABS) 100 MG tablet, Take 1 tablet (100 mg total) by mouth 2 (two) times daily., Disp: 60 tablet, Rfl: 0 .  fluticasone (FLONASE) 50 MCG/ACT nasal spray, Place into the nose. Place 2 sprays into both nostrils once daily, Disp: , Rfl:  .  furosemide (LASIX) 20 MG tablet, Take 2 tablets (40 mg total) by mouth 2 (two) times daily., Disp: 30 tablet, Rfl: 0 .  gabapentin (NEURONTIN) 100 MG capsule, Take 100-300 mg by mouth See admin instructions. Take 1 capsule (100mg ) by mouth at dinnertime and take 3 capsules (300mg ) by mouth at bedtime, Disp: , Rfl:  .  Insulin Degludec 200 UNIT/ML SOPN, Inject 8  Units into the skin daily., Disp: 0.5 mL, Rfl: 0 .  irbesartan (AVAPRO) 300 MG tablet, TAKE 1 TABLET BY MOUTH EVERY DAY, Disp: , Rfl:  .  loratadine (CLARITIN REDITABS) 10 MG dissolvable tablet, Take by mouth., Disp: , Rfl:  .  metFORMIN (GLUCOPHAGE-XR) 500 MG 24 hr tablet, Take 1,000 mg by mouth 2 (two) times daily. , Disp: , Rfl:  .  metoprolol succinate (TOPROL-XL) 25 MG 24 hr tablet, Take 12.5 mg by mouth daily. , Disp: , Rfl:  .  polyethylene glycol (MIRALAX / GLYCOLAX) 17 g packet, Take 17 g by mouth daily., Disp: 14 each, Rfl: 0 .  potassium chloride (KLOR-CON) 10 MEQ tablet, Take 1 tablet (10 mEq total) by mouth daily., Disp: 60 tablet, Rfl: 0 .  senna-docusate (SENOKOT-S) 8.6-50 MG tablet, Take 2 tablets by mouth at bedtime., Disp: 10 tablet, Rfl: 0 .  triamcinolone cream (KENALOG) 0.1 %, Apply 1 application topically 2 (two) times daily as needed (skin irritations). , Disp: , Rfl:   Social History   Tobacco Use  Smoking Status Former Smoker  . Packs/day: 0.50  . Years: 50.00  . Pack years: 25.00  . Types: Cigarettes  Smokeless Tobacco Current User    Allergies  Allergen Reactions  . Lactose Intolerance (Gi)   .  Sitagliptin Rash and Other (See Comments)    Januvia   Objective:  There were no vitals filed for this visit. There is no height or weight on file to calculate BMI. Constitutional Well developed. Well nourished.  Vascular Dorsalis pedis pulses palpable bilaterally. Posterior tibial pulses palpable bilaterally. Capillary refill normal to all digits.  No cyanosis or clubbing noted. Pedal hair growth normal.  Neurologic Normal speech. Oriented to person, place, and time. Protective sensation absent  Dermatologic Wound Location: Left posterior leg and left lateral leg with fat layer exposed.  Does not probe down to bone.  No cellulitis or erythema noted.  No malodor present. Wound Base: Mixed Granular/Fibrotic Peri-wound: Normal Exudate: Scant/small amount  Serous exudate Wound Measurements: -See below  Orthopedic: No pain to palpation either foot.   Radiographs: None Assessment:   1. Leg ulcer, left, with fat layer exposed (HCC)   2. Diabetic polyneuropathy associated with type 2 diabetes mellitus (HCC)    Plan:  Patient was evaluated and treated and all questions answered.  Ulcer left arterial leg ulcer x1 with fat layer exposed now with 1 ulcer completely reepithelialized -Debridement as below.. -Continue off-loading with surgical shoe. -ABIs PVR showed adequate flow to the lower extremity to allow for these ulcerations to heal.  Given that patient has good flow I will discuss with her about graft application. -Continue to apply 2 x 2 medical graft. Tides.  Application of synthetic skin graft/substitute  Name: Tides Medical Graft  Usage: 4 x 4 cm Graft was applied directly to the listed above wound site and secured with Adaptic, kerlix, Ace bandage. Waste: No graft material was wasted and was applied in its entirety.   Procedure: Excisional Debridement of Wound posterior leg  with application of skin substitute/skin graft~decreasing Tool: Sharp chisel blade/tissue nipper Rationale: Removal of non-viable soft tissue from the wound to promote healing.  Anesthesia: none Pre-Debridement Wound Measurements: 2.0 cm  x 2.0 cm  x 0.3 cm Post-Debridement Wound Measurements: 2.1 cm  x 2.1 cm  x 0.3 cm Type of Debridement: Sharp Excisional Tissue Removed: Non-viable soft tissue Blood loss: Minimal (<50cc) Depth of Debridement: subcutaneous tissue. Technique: Sharp excisional debridement to bleeding, viable wound base.  Wound Progress: The wound is regressing with continued application of tides medical graft Dressing: Dry, sterile, compression dressing. Disposition: Patient tolerated procedure well. Patient to return in 1 week for follow-up.    No follow-ups on file.

## 2020-09-30 ENCOUNTER — Other Ambulatory Visit: Payer: Self-pay

## 2020-09-30 ENCOUNTER — Encounter: Payer: Self-pay | Admitting: Podiatry

## 2020-09-30 ENCOUNTER — Ambulatory Visit (INDEPENDENT_AMBULATORY_CARE_PROVIDER_SITE_OTHER): Payer: Medicare Other | Admitting: Podiatry

## 2020-09-30 DIAGNOSIS — E1142 Type 2 diabetes mellitus with diabetic polyneuropathy: Secondary | ICD-10-CM | POA: Diagnosis not present

## 2020-09-30 DIAGNOSIS — L97922 Non-pressure chronic ulcer of unspecified part of left lower leg with fat layer exposed: Secondary | ICD-10-CM | POA: Diagnosis not present

## 2020-09-30 NOTE — Progress Notes (Addendum)
Subjective:  Patient ID: Jeremiah Chapman, male    DOB: 25-Feb-1946,  MRN: [id number]  No chief complaint on file.   74 y.o. male presents for wound care.  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  Patient states that 2 of these ulcers have completely healed.  The past medical grafts are helping considerably.  He now only has 1 ulcerations to the left posterior leg.   Review of Systems: Negative except as noted in the HPI. Denies N/V/F/Ch.  Past Medical History:  Diagnosis Date   Atrial fibrillation (HCC)    CAD (coronary artery disease)    CHF (congestive heart failure) (HCC)    Diabetes mellitus without complication (HCC)    Gout    Hypercholesterolemia    Hypertension    Pleural effusion     Current Outpatient Medications:    albuterol (VENTOLIN HFA) 108 (90 Base) MCG/ACT inhaler, Inhale 2 puffs into the lungs every 6 (six) hours as needed. , Disp: , Rfl:    apixaban (ELIQUIS) 5 MG TABS tablet, Take 5 mg by mouth every 12 (twelve) hours. , Disp: , Rfl:    aspirin 81 MG EC tablet, Take 81 mg by mouth daily. , Disp: , Rfl:    atorvastatin (LIPITOR) 80 MG tablet, TAKE 1 TABLET BY MOUTH EVERY DAY, Disp: , Rfl:    digoxin (LANOXIN) 0.125 MG tablet, TAKE 1 TABLET BY MOUTH EVERY DAY, Disp: , Rfl:    doxycycline (VIBRA-TABS) 100 MG tablet, Take 1 tablet (100 mg total) by mouth 2 (two) times daily., Disp: 60 tablet, Rfl: 0   fluticasone (FLONASE) 50 MCG/ACT nasal spray, Place into the nose. Place 2 sprays into both nostrils once daily, Disp: , Rfl:    furosemide (LASIX) 20 MG tablet, Take 2 tablets (40 mg total) by mouth 2 (two) times daily., Disp: 30 tablet, Rfl: 0   gabapentin (NEURONTIN) 100 MG capsule, Take 100-300 mg by mouth See admin instructions. Take 1 capsule (100mg ) by mouth at dinnertime and take 3 capsules (300mg ) by mouth at bedtime, Disp: , Rfl:    Insulin Degludec 200 UNIT/ML SOPN, Inject 8 Units into the skin daily., Disp: 0.5 mL, Rfl: 0    irbesartan (AVAPRO) 300 MG tablet, TAKE 1 TABLET BY MOUTH EVERY DAY, Disp: , Rfl:    loratadine (CLARITIN REDITABS) 10 MG dissolvable tablet, Take by mouth., Disp: , Rfl:    metFORMIN (GLUCOPHAGE-XR) 500 MG 24 hr tablet, Take 1,000 mg by mouth 2 (two) times daily. , Disp: , Rfl:    metoprolol succinate (TOPROL-XL) 25 MG 24 hr tablet, Take 12.5 mg by mouth daily. , Disp: , Rfl:    polyethylene glycol (MIRALAX / GLYCOLAX) 17 g packet, Take 17 g by mouth daily., Disp: 14 each, Rfl: 0   potassium chloride (KLOR-CON) 10 MEQ tablet, Take 1 tablet (10 mEq total) by mouth daily., Disp: 60 tablet, Rfl: 0   senna-docusate (SENOKOT-S) 8.6-50 MG tablet, Take 2 tablets by mouth at bedtime., Disp: 10 tablet, Rfl: 0   triamcinolone cream (KENALOG) 0.1 %, Apply 1 application topically 2 (two) times daily as needed (skin irritations). , Disp: , Rfl:   Social History   Tobacco Use  Smoking Status Former Smoker   Packs/day: 0.50   Years: 50.00   Pack years: 25.00   Types: Cigarettes  Smokeless Tobacco Current User    Allergies  Allergen Reactions   Lactose Intolerance (Gi)    Sitagliptin Rash and Other (See Comments)    Januvia  Objective:  There were no vitals filed for this visit. There is no height or weight on file to calculate BMI. Constitutional Well developed. Well nourished.  Vascular Dorsalis pedis pulses palpable bilaterally. Posterior tibial pulses palpable bilaterally. Capillary refill normal to all digits.  No cyanosis or clubbing noted. Pedal hair growth normal.  Neurologic Normal speech. Oriented to person, place, and time. Protective sensation absent  Dermatologic Wound Location: Left posterior leg and left lateral leg with fat layer exposed.  Does not probe down to bone.  No cellulitis or erythema noted.  No malodor present. Wound Base: Mixed Granular/Fibrotic Peri-wound: Normal Exudate: Scant/small amount Serous exudate Wound Measurements: -See below    Orthopedic: No pain to palpation either foot.   Radiographs: None Assessment:   1. Diabetic polyneuropathy associated with type 2 diabetes mellitus (HCC)   2. Leg ulcer, left, with fat layer exposed (HCC)    Plan:  Patient was evaluated and treated and all questions answered.  Ulcer left arterial leg ulcer x1 with fat layer exposed now with 1 ulcer completely reepithelialized -Debridement as below.. -Continue off-loading with surgical shoe. -ABIs PVR showed adequate flow to the lower extremity to allow for these ulcerations to heal.  Given that patient has good flow I will discuss with her about graft application. -Continue to apply 4 x 4 medical graft. Tides.  Application of synthetic skin graft/substitute  Name: Tides Medical Graft  Usage: 4 x 4 cm Graft was applied directly to the listed above wound site and secured with Adaptic, kerlix, Ace bandage. Waste: No graft material was wasted and was applied in its entirety.   Procedure: Excisional Debridement of Wound posterior leg  with application of skin substitute/skin graft~decreasing Tool: Sharp chisel blade/tissue nipper Rationale: Removal of non-viable soft tissue from the wound to promote healing.  Anesthesia: none Pre-Debridement Wound Measurements: 1.7 cm  x 1.4 cm  x 0.3 cm Post-Debridement Wound Measurements: 1.8 cm  x 1.6 cm  x 0.3 cm Type of Debridement: Sharp Excisional Tissue Removed: Non-viable soft tissue Blood loss: Minimal (<50cc) Depth of Debridement: subcutaneous tissue. Technique: Sharp excisional debridement to bleeding, viable wound base.  Wound Progress: The wound is regressing with continued application of tides medical graft Dressing: Dry, sterile, compression dressing. Disposition: Patient tolerated procedure well. Patient to return in 1 week for follow-up.    No follow-ups on file.

## 2020-10-07 ENCOUNTER — Encounter: Payer: Self-pay | Admitting: Podiatry

## 2020-10-07 ENCOUNTER — Other Ambulatory Visit: Payer: Self-pay

## 2020-10-07 ENCOUNTER — Ambulatory Visit (INDEPENDENT_AMBULATORY_CARE_PROVIDER_SITE_OTHER): Payer: Medicare Other | Admitting: Podiatry

## 2020-10-07 DIAGNOSIS — L97922 Non-pressure chronic ulcer of unspecified part of left lower leg with fat layer exposed: Secondary | ICD-10-CM

## 2020-10-07 DIAGNOSIS — E1142 Type 2 diabetes mellitus with diabetic polyneuropathy: Secondary | ICD-10-CM

## 2020-10-07 NOTE — Progress Notes (Addendum)
Subjective:  Patient ID: Jeremiah Chapman, male    DOB: [birth year],  MRN: [id number]  Chief Complaint  Patient presents with  . Wound Check    74 y.o. male presents for wound care.  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  Patient states that 2 of these ulcers have completely healed.  The past medical grafts are helping considerably.  He now only has 1 ulcerations to the left posterior leg.   Review of Systems: Negative except as noted in the HPI. Denies N/V/F/Ch.  Past Medical History:  Diagnosis Date  . Atrial fibrillation (HCC)   . CAD (coronary artery disease)   . CHF (congestive heart failure) (HCC)   . Diabetes mellitus without complication (HCC)   . Gout   . Hypercholesterolemia   . Hypertension   . Pleural effusion     Current Outpatient Medications:  .  albuterol (VENTOLIN HFA) 108 (90 Base) MCG/ACT inhaler, Inhale 2 puffs into the lungs every 6 (six) hours as needed. , Disp: , Rfl:  .  apixaban (ELIQUIS) 5 MG TABS tablet, Take 5 mg by mouth every 12 (twelve) hours. , Disp: , Rfl:  .  aspirin 81 MG EC tablet, Take 81 mg by mouth daily. , Disp: , Rfl:  .  atorvastatin (LIPITOR) 80 MG tablet, TAKE 1 TABLET BY MOUTH EVERY DAY, Disp: , Rfl:  .  digoxin (LANOXIN) 0.125 MG tablet, TAKE 1 TABLET BY MOUTH EVERY DAY, Disp: , Rfl:  .  doxycycline (VIBRA-TABS) 100 MG tablet, Take 1 tablet (100 mg total) by mouth 2 (two) times daily., Disp: 60 tablet, Rfl: 0 .  fluticasone (FLONASE) 50 MCG/ACT nasal spray, Place into the nose. Place 2 sprays into both nostrils once daily, Disp: , Rfl:  .  furosemide (LASIX) 20 MG tablet, Take 2 tablets (40 mg total) by mouth 2 (two) times daily., Disp: 30 tablet, Rfl: 0 .  gabapentin (NEURONTIN) 100 MG capsule, Take 100-300 mg by mouth See admin instructions. Take 1 capsule (100mg ) by mouth at dinnertime and take 3 capsules (300mg ) by mouth at bedtime, Disp: , Rfl:  .  Insulin Degludec 200 UNIT/ML SOPN, Inject 8 Units into the skin  daily., Disp: 0.5 mL, Rfl: 0 .  irbesartan (AVAPRO) 300 MG tablet, TAKE 1 TABLET BY MOUTH EVERY DAY, Disp: , Rfl:  .  loratadine (CLARITIN REDITABS) 10 MG dissolvable tablet, Take by mouth., Disp: , Rfl:  .  metFORMIN (GLUCOPHAGE-XR) 500 MG 24 hr tablet, Take 1,000 mg by mouth 2 (two) times daily. , Disp: , Rfl:  .  metoprolol succinate (TOPROL-XL) 25 MG 24 hr tablet, Take 12.5 mg by mouth daily. , Disp: , Rfl:  .  polyethylene glycol (MIRALAX / GLYCOLAX) 17 g packet, Take 17 g by mouth daily., Disp: 14 each, Rfl: 0 .  potassium chloride (KLOR-CON) 10 MEQ tablet, Take 1 tablet (10 mEq total) by mouth daily., Disp: 60 tablet, Rfl: 0 .  senna-docusate (SENOKOT-S) 8.6-50 MG tablet, Take 2 tablets by mouth at bedtime., Disp: 10 tablet, Rfl: 0 .  triamcinolone cream (KENALOG) 0.1 %, Apply 1 application topically 2 (two) times daily as needed (skin irritations). , Disp: , Rfl:   Social History   Tobacco Use  Smoking Status Former Smoker  . Packs/day: 0.50  . Years: 50.00  . Pack years: 25.00  . Types: Cigarettes  Smokeless Tobacco Current User    Allergies  Allergen Reactions  . Lactose Intolerance (Gi)   . Sitagliptin Rash and Other (  See Comments)    Januvia   Objective:  There were no vitals filed for this visit. There is no height or weight on file to calculate BMI. Constitutional Well developed. Well nourished.  Vascular Dorsalis pedis pulses palpable bilaterally. Posterior tibial pulses palpable bilaterally. Capillary refill normal to all digits.  No cyanosis or clubbing noted. Pedal hair growth normal.  Neurologic Normal speech. Oriented to person, place, and time. Protective sensation absent  Dermatologic Wound Location: Left posterior leg and left lateral leg with fat layer exposed.  Does not probe down to bone.  No cellulitis or erythema noted.  No malodor present. Wound Base: Mixed Granular/Fibrotic Peri-wound: Normal Exudate: Scant/small amount Serous exudate Wound  Measurements: -See below  Orthopedic: No pain to palpation either foot.   Radiographs: None Assessment:   1. Diabetic polyneuropathy associated with type 2 diabetes mellitus (HCC)   2. Leg ulcer, left, with fat layer exposed (HCC)    Plan:  Patient was evaluated and treated and all questions answered.  Ulcer left arterial leg ulcer x1 with fat layer exposed now with 1 ulcer completely reepithelialized -Debridement as below.. -Continue off-loading with surgical shoe. -ABIs PVR showed adequate flow to the lower extremity to allow for these ulcerations to heal.  Given that patient has good flow I will discuss with her about graft application. -Continue to apply 4 x 4 medical graft. Tides.  Application of synthetic skin graft/substitute  Name: Tides Medical Graft  Usage: 4 x 4 cm Graft was applied directly to the listed above wound site and secured with Adaptic, kerlix, Ace bandage. Waste: No graft material was wasted and was applied in its entirety.   Procedure: Excisional Debridement of Wound posterior leg  with application of skin substitute/skin graft~decreasing Tool: Sharp chisel blade/tissue nipper Rationale: Removal of non-viable soft tissue from the wound to promote healing.  Anesthesia: none Pre-Debridement Wound Measurements: 1.5 cm  x 1.4 cm  x 0.3 cm Post-Debridement Wound Measurements:1.6 cm  x 1.5 cm  x 0.3 cm Type of Debridement: Sharp Excisional Tissue Removed: Non-viable soft tissue Blood loss: Minimal (<50cc) Depth of Debridement: subcutaneous tissue. Technique: Sharp excisional debridement to bleeding, viable wound base.  Wound Progress: The wound is regressing with continued application of tides medical graft Dressing: Dry, sterile, compression dressing. Disposition: Patient tolerated procedure well. Patient to return in 1 week for follow-up.    No follow-ups on file.

## 2020-10-14 ENCOUNTER — Ambulatory Visit (INDEPENDENT_AMBULATORY_CARE_PROVIDER_SITE_OTHER): Payer: Medicare Other | Admitting: Podiatry

## 2020-10-14 ENCOUNTER — Other Ambulatory Visit: Payer: Self-pay

## 2020-10-14 ENCOUNTER — Encounter: Payer: Self-pay | Admitting: Podiatry

## 2020-10-14 DIAGNOSIS — L97922 Non-pressure chronic ulcer of unspecified part of left lower leg with fat layer exposed: Secondary | ICD-10-CM

## 2020-10-14 DIAGNOSIS — E1142 Type 2 diabetes mellitus with diabetic polyneuropathy: Secondary | ICD-10-CM | POA: Diagnosis not present

## 2020-10-14 MED ORDER — SANTYL 250 UNIT/GM EX OINT
1.0000 | TOPICAL_OINTMENT | Freq: Every day | CUTANEOUS | 0 refills | Status: DC
Start: 2020-10-14 — End: 2020-12-04

## 2020-10-14 NOTE — Progress Notes (Signed)
Subjective:  Patient ID: Jeremiah Chapman, male    DOB: 1946/01/23,  MRN: [id number]  Chief Complaint  Patient presents with  . Wound Check    about the same    74 y.o. male presents for wound care.  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  Patient states that 2 of these ulcers have completely healed.  The past medical grafts are helping considerably.  He now only has 1 ulcerations to the left posterior leg.   Review of Systems: Negative except as noted in the HPI. Denies N/V/F/Ch.  Past Medical History:  Diagnosis Date  . Atrial fibrillation (HCC)   . CAD (coronary artery disease)   . CHF (congestive heart failure) (HCC)   . Diabetes mellitus without complication (HCC)   . Gout   . Hypercholesterolemia   . Hypertension   . Pleural effusion     Current Outpatient Medications:  .  albuterol (VENTOLIN HFA) 108 (90 Base) MCG/ACT inhaler, Inhale 2 puffs into the lungs every 6 (six) hours as needed. , Disp: , Rfl:  .  apixaban (ELIQUIS) 5 MG TABS tablet, Take 5 mg by mouth every 12 (twelve) hours. , Disp: , Rfl:  .  aspirin 81 MG EC tablet, Take 81 mg by mouth daily. , Disp: , Rfl:  .  atorvastatin (LIPITOR) 80 MG tablet, TAKE 1 TABLET BY MOUTH EVERY DAY, Disp: , Rfl:  .  collagenase (SANTYL) ointment, Apply 1 application topically daily., Disp: 15 g, Rfl: 0 .  digoxin (LANOXIN) 0.125 MG tablet, TAKE 1 TABLET BY MOUTH EVERY DAY, Disp: , Rfl:  .  doxycycline (VIBRA-TABS) 100 MG tablet, Take 1 tablet (100 mg total) by mouth 2 (two) times daily., Disp: 60 tablet, Rfl: 0 .  fluticasone (FLONASE) 50 MCG/ACT nasal spray, Place into the nose. Place 2 sprays into both nostrils once daily, Disp: , Rfl:  .  furosemide (LASIX) 20 MG tablet, Take 2 tablets (40 mg total) by mouth 2 (two) times daily., Disp: 30 tablet, Rfl: 0 .  gabapentin (NEURONTIN) 100 MG capsule, Take 100-300 mg by mouth See admin instructions. Take 1 capsule (100mg ) by mouth at dinnertime and take 3 capsules  (300mg ) by mouth at bedtime, Disp: , Rfl:  .  Insulin Degludec 200 UNIT/ML SOPN, Inject 8 Units into the skin daily., Disp: 0.5 mL, Rfl: 0 .  irbesartan (AVAPRO) 300 MG tablet, TAKE 1 TABLET BY MOUTH EVERY DAY, Disp: , Rfl:  .  loratadine (CLARITIN REDITABS) 10 MG dissolvable tablet, Take by mouth., Disp: , Rfl:  .  metFORMIN (GLUCOPHAGE-XR) 500 MG 24 hr tablet, Take 1,000 mg by mouth 2 (two) times daily. , Disp: , Rfl:  .  metoprolol succinate (TOPROL-XL) 25 MG 24 hr tablet, Take 12.5 mg by mouth daily. , Disp: , Rfl:  .  polyethylene glycol (MIRALAX / GLYCOLAX) 17 g packet, Take 17 g by mouth daily., Disp: 14 each, Rfl: 0 .  potassium chloride (KLOR-CON) 10 MEQ tablet, Take 1 tablet (10 mEq total) by mouth daily., Disp: 60 tablet, Rfl: 0 .  senna-docusate (SENOKOT-S) 8.6-50 MG tablet, Take 2 tablets by mouth at bedtime., Disp: 10 tablet, Rfl: 0 .  triamcinolone cream (KENALOG) 0.1 %, Apply 1 application topically 2 (two) times daily as needed (skin irritations). , Disp: , Rfl:   Social History   Tobacco Use  Smoking Status Former Smoker  . Packs/day: 0.50  . Years: 50.00  . Pack years: 25.00  . Types: Cigarettes  Smokeless Tobacco  Current User    Allergies  Allergen Reactions  . Lactose Intolerance (Gi)   . Sitagliptin Rash and Other (See Comments)    Januvia   Objective:  There were no vitals filed for this visit. There is no height or weight on file to calculate BMI. Constitutional Well developed. Well nourished.  Vascular Dorsalis pedis pulses palpable bilaterally. Posterior tibial pulses palpable bilaterally. Capillary refill normal to all digits.  No cyanosis or clubbing noted. Pedal hair growth normal.  Neurologic Normal speech. Oriented to person, place, and time. Protective sensation absent  Dermatologic Wound Location: Left posterior leg and left lateral leg with fat layer exposed.  Does not probe down to bone.  No cellulitis or erythema noted.  No malodor  present. Wound Base: Mixed Granular/Fibrotic Peri-wound: Normal Exudate: Scant/small amount Serous exudate Wound Measurements: -See below  Orthopedic: No pain to palpation either foot.   Radiographs: None Assessment:   1. Diabetic polyneuropathy associated with type 2 diabetes mellitus (HCC)   2. Leg ulcer, left, with fat layer exposed (HCC)    Plan:  Patient was evaluated and treated and all questions answered.  Ulcer left arterial leg ulcer x1 with fat layer exposed now with 1 ulcer completely reepithelialized -Debridement as below.. -Continue off-loading with surgical shoe. -ABIs PVR showed adequate flow to the lower extremity to allow for these ulcerations to heal.  Given that patient has good flow I will discuss with her about graft application. -Continue to apply 4 x 4 medical graft. Tides.  Application of synthetic skin graft/substitute  Name: Tides Medical Graft  Usage: 4 x 4 cm Graft was applied directly to the listed above wound site and secured with Adaptic, kerlix, Ace bandage. Waste: No graft material was wasted and was applied in its entirety.   Procedure: Excisional Debridement of Wound posterior leg  with application of skin substitute/skin graft~decreasing Tool: Sharp chisel blade/tissue nipper Rationale: Removal of non-viable soft tissue from the wound to promote healing.  Anesthesia: none Pre-Debridement Wound Measurements: 1.4 cm  x 1.2 cm  x 0.3 cm Post-Debridement Wound Measurements:1.5 cm  x 1.3 cm  x 0.3 cm Type of Debridement: Sharp Excisional Tissue Removed: Non-viable soft tissue Blood loss: Minimal (<50cc) Depth of Debridement: subcutaneous tissue. Technique: Sharp excisional debridement to bleeding, viable wound base.  Wound Progress: The wound is regressing with continued application of tides medical graft Dressing: Dry, sterile, compression dressing. Disposition: Patient tolerated procedure well. Patient to return in 1 week for  follow-up.    No follow-ups on file.

## 2020-10-21 ENCOUNTER — Encounter: Payer: Self-pay | Admitting: Podiatry

## 2020-10-21 ENCOUNTER — Other Ambulatory Visit: Payer: Self-pay

## 2020-10-21 ENCOUNTER — Ambulatory Visit (INDEPENDENT_AMBULATORY_CARE_PROVIDER_SITE_OTHER): Payer: Medicare Other | Admitting: Podiatry

## 2020-10-21 DIAGNOSIS — L97922 Non-pressure chronic ulcer of unspecified part of left lower leg with fat layer exposed: Secondary | ICD-10-CM | POA: Diagnosis not present

## 2020-10-21 DIAGNOSIS — E1142 Type 2 diabetes mellitus with diabetic polyneuropathy: Secondary | ICD-10-CM

## 2020-10-21 NOTE — Progress Notes (Signed)
Subjective:  Patient ID: Jeremiah Chapman, male    DOB: [birth year],  MRN: [id number]  No chief complaint on file.   74 y.o. male presents for wound care.  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  Patient states that 2 of these ulcers have completely healed. He has brought his center medication with him. He denies any other acute complaints.  Review of Systems: Negative except as noted in the HPI. Denies N/V/F/Ch.  Past Medical History:  Diagnosis Date  . Atrial fibrillation (HCC)   . CAD (coronary artery disease)   . CHF (congestive heart failure) (HCC)   . Diabetes mellitus without complication (HCC)   . Gout   . Hypercholesterolemia   . Hypertension   . Pleural effusion     Current Outpatient Medications:  .  albuterol (VENTOLIN HFA) 108 (90 Base) MCG/ACT inhaler, Inhale 2 puffs into the lungs every 6 (six) hours as needed. , Disp: , Rfl:  .  apixaban (ELIQUIS) 5 MG TABS tablet, Take 5 mg by mouth every 12 (twelve) hours. , Disp: , Rfl:  .  aspirin 81 MG EC tablet, Take 81 mg by mouth daily. , Disp: , Rfl:  .  atorvastatin (LIPITOR) 80 MG tablet, TAKE 1 TABLET BY MOUTH EVERY DAY, Disp: , Rfl:  .  collagenase (SANTYL) ointment, Apply 1 application topically daily., Disp: 15 g, Rfl: 0 .  digoxin (LANOXIN) 0.125 MG tablet, TAKE 1 TABLET BY MOUTH EVERY DAY, Disp: , Rfl:  .  doxycycline (VIBRA-TABS) 100 MG tablet, Take 1 tablet (100 mg total) by mouth 2 (two) times daily., Disp: 60 tablet, Rfl: 0 .  fluticasone (FLONASE) 50 MCG/ACT nasal spray, Place into the nose. Place 2 sprays into both nostrils once daily, Disp: , Rfl:  .  furosemide (LASIX) 20 MG tablet, Take 2 tablets (40 mg total) by mouth 2 (two) times daily., Disp: 30 tablet, Rfl: 0 .  gabapentin (NEURONTIN) 100 MG capsule, Take 100-300 mg by mouth See admin instructions. Take 1 capsule (100mg ) by mouth at dinnertime and take 3 capsules (300mg ) by mouth at bedtime, Disp: , Rfl:  .  Insulin Degludec 200 UNIT/ML  SOPN, Inject 8 Units into the skin daily., Disp: 0.5 mL, Rfl: 0 .  irbesartan (AVAPRO) 300 MG tablet, TAKE 1 TABLET BY MOUTH EVERY DAY, Disp: , Rfl:  .  loratadine (CLARITIN REDITABS) 10 MG dissolvable tablet, Take by mouth., Disp: , Rfl:  .  metFORMIN (GLUCOPHAGE-XR) 500 MG 24 hr tablet, Take 1,000 mg by mouth 2 (two) times daily. , Disp: , Rfl:  .  metoprolol succinate (TOPROL-XL) 25 MG 24 hr tablet, Take 12.5 mg by mouth daily. , Disp: , Rfl:  .  polyethylene glycol (MIRALAX / GLYCOLAX) 17 g packet, Take 17 g by mouth daily., Disp: 14 each, Rfl: 0 .  potassium chloride (KLOR-CON) 10 MEQ tablet, Take 1 tablet (10 mEq total) by mouth daily., Disp: 60 tablet, Rfl: 0 .  senna-docusate (SENOKOT-S) 8.6-50 MG tablet, Take 2 tablets by mouth at bedtime., Disp: 10 tablet, Rfl: 0 .  triamcinolone cream (KENALOG) 0.1 %, Apply 1 application topically 2 (two) times daily as needed (skin irritations). , Disp: , Rfl:   Social History   Tobacco Use  Smoking Status Former Smoker  . Packs/day: 0.50  . Years: 50.00  . Pack years: 25.00  . Types: Cigarettes  Smokeless Tobacco Current User    Allergies  Allergen Reactions  . Lactose Intolerance (Gi)   . Sitagliptin Rash  and Other (See Comments)    Januvia   Objective:  There were no vitals filed for this visit. There is no height or weight on file to calculate BMI. Constitutional Well developed. Well nourished.  Vascular Dorsalis pedis pulses palpable bilaterally. Posterior tibial pulses palpable bilaterally. Capillary refill normal to all digits.  No cyanosis or clubbing noted. Pedal hair growth normal.  Neurologic Normal speech. Oriented to person, place, and time. Protective sensation absent  Dermatologic Wound Location: Left posterior leg and left lateral leg with fat layer exposed.  Does not probe down to bone.  No cellulitis or erythema noted.  No malodor present. Wound Base: Mixed Granular/Fibrotic Peri-wound: Normal Exudate:  Scant/small amount Serous exudate Wound Measurements: -See below  Orthopedic: No pain to palpation either foot.   Radiographs: None Assessment:   1. Leg ulcer, left, with fat layer exposed (HCC)   2. Diabetic polyneuropathy associated with type 2 diabetes mellitus (HCC)    Plan:  Patient was evaluated and treated and all questions answered.  Ulcer left arterial leg ulcer x1 with fat layer exposed now with 1 ulcer completely reepithelialized -Debridement as below.. -Continue off-loading with surgical shoe. -ABIs PVR showed adequate flow to the lower extremity to allow for these ulcerations to heal.  Given that patient has good flow I will discuss with her about graft application. -I will hold off on applying tides medical graft and gave him the graft holiday. We will continue applying next time. For now patient will benefit from applying Santyl wet-to-dry dressing changes daily. I discussed this with patient in extensive detail. -Continue Santyl wet-to-dry dressing changes    Procedure: Excisional Debridement of Wound posterior leg  with application of skin substitute/skin graft~decreasing Tool: Sharp chisel blade/tissue nipper Rationale: Removal of non-viable soft tissue from the wound to promote healing.  Anesthesia: none Pre-Debridement Wound Measurements: 1.4 cm  x 1.2 cm  x 0.3 cm Post-Debridement Wound Measurements:1.5 cm  x 1.3 cm  x 0.3 cm Type of Debridement: Sharp Excisional Tissue Removed: Non-viable soft tissue Blood loss: Minimal (<50cc) Depth of Debridement: subcutaneous tissue. Technique: Sharp excisional debridement to bleeding, viable wound base.  Wound Progress: The wound is regressing with continued application of tides medical graft Dressing: Dry, sterile, compression dressing. Disposition: Patient tolerated procedure well. Patient to return in 1 week for follow-up.    No follow-ups on file.

## 2020-10-27 ENCOUNTER — Ambulatory Visit (INDEPENDENT_AMBULATORY_CARE_PROVIDER_SITE_OTHER): Payer: Medicare Other | Admitting: Podiatry

## 2020-10-27 ENCOUNTER — Encounter: Payer: Self-pay | Admitting: Podiatry

## 2020-10-27 ENCOUNTER — Other Ambulatory Visit: Payer: Self-pay

## 2020-10-27 DIAGNOSIS — B351 Tinea unguium: Secondary | ICD-10-CM

## 2020-10-27 DIAGNOSIS — D689 Coagulation defect, unspecified: Secondary | ICD-10-CM

## 2020-10-27 DIAGNOSIS — L97909 Non-pressure chronic ulcer of unspecified part of unspecified lower leg with unspecified severity: Secondary | ICD-10-CM

## 2020-10-27 DIAGNOSIS — M79609 Pain in unspecified limb: Secondary | ICD-10-CM

## 2020-10-27 NOTE — Progress Notes (Signed)
This patient returns to my office for at risk foot care.  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  Patient is taking eliquis.  This patient is unable to cut nails himself since the patient cannot reach his nails.These nails are painful walking and wearing shoes. Patient is being treated for ulcer left leg.  This patient presents for at risk foot care today.  General Appearance  Alert, conversant and in no acute stress.  Vascular  Dorsalis pedis and posterior tibial  pulses are palpable  bilaterally.  Capillary return is within normal limits  bilaterally. Temperature is within normal limits  bilaterally.  Neurologic  Senn-Weinstein monofilament wire test within normal limits  bilaterally. Muscle power within normal limits bilaterally.  Nails Thick disfigured discolored nails with subungual debris  from hallux to fifth toes bilaterally. No evidence of bacterial infection or drainage bilaterally.  Orthopedic  No limitations of motion  feet .  No crepitus or effusions noted.  No bony pathology or digital deformities noted.  Skin  normotropic skin with no porokeratosis noted bilaterally.  No signs of infections or ulcers noted.     Onychomycosis  Pain in right toes  Pain in left toes  Consent was obtained for treatment procedures.   Mechanical debridement of nails 1-5  bilaterally performed with a nail nipper.  Filed with dremel without incident.    Return office visit   3 months                   Told patient to return for periodic foot care and evaluation due to potential at risk complications.   Helane Gunther DPM

## 2020-11-04 ENCOUNTER — Encounter: Payer: Self-pay | Admitting: Podiatry

## 2020-11-04 ENCOUNTER — Other Ambulatory Visit: Payer: Self-pay

## 2020-11-04 ENCOUNTER — Ambulatory Visit (INDEPENDENT_AMBULATORY_CARE_PROVIDER_SITE_OTHER): Payer: Medicare Other | Admitting: Podiatry

## 2020-11-04 DIAGNOSIS — E1142 Type 2 diabetes mellitus with diabetic polyneuropathy: Secondary | ICD-10-CM | POA: Diagnosis not present

## 2020-11-04 DIAGNOSIS — L97922 Non-pressure chronic ulcer of unspecified part of left lower leg with fat layer exposed: Secondary | ICD-10-CM | POA: Diagnosis not present

## 2020-11-04 NOTE — Progress Notes (Signed)
Subjective:  Patient ID: Jeremiah Chapman, male    DOB: [birth year],  MRN: [id number]  Chief Complaint  Patient presents with  . Wound Check    "I dont think its any better'    74 y.o. male presents for wound care.  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  He has been applying Santyl wet-to-dry dressing on it.  He denies any other acute complaints.  Review of Systems: Negative except as noted in the HPI. Denies N/V/F/Ch.  Past Medical History:  Diagnosis Date  . Atrial fibrillation (HCC)   . CAD (coronary artery disease)   . CHF (congestive heart failure) (HCC)   . Diabetes mellitus without complication (HCC)   . Gout   . Hypercholesterolemia   . Hypertension   . Pleural effusion     Current Outpatient Medications:  .  albuterol (VENTOLIN HFA) 108 (90 Base) MCG/ACT inhaler, Inhale 2 puffs into the lungs every 6 (six) hours as needed. , Disp: , Rfl:  .  apixaban (ELIQUIS) 5 MG TABS tablet, Take 5 mg by mouth every 12 (twelve) hours. , Disp: , Rfl:  .  aspirin 81 MG EC tablet, Take 81 mg by mouth daily. , Disp: , Rfl:  .  atorvastatin (LIPITOR) 80 MG tablet, TAKE 1 TABLET BY MOUTH EVERY DAY, Disp: , Rfl:  .  collagenase (SANTYL) ointment, Apply 1 application topically daily., Disp: 15 g, Rfl: 0 .  digoxin (LANOXIN) 0.125 MG tablet, TAKE 1 TABLET BY MOUTH EVERY DAY, Disp: , Rfl:  .  doxycycline (VIBRA-TABS) 100 MG tablet, Take 1 tablet (100 mg total) by mouth 2 (two) times daily., Disp: 60 tablet, Rfl: 0 .  FARXIGA 5 MG TABS tablet, Take 5 mg by mouth daily., Disp: , Rfl:  .  fluticasone (FLONASE) 50 MCG/ACT nasal spray, Place into the nose. Place 2 sprays into both nostrils once daily, Disp: , Rfl:  .  furosemide (LASIX) 20 MG tablet, Take 2 tablets (40 mg total) by mouth 2 (two) times daily., Disp: 30 tablet, Rfl: 0 .  gabapentin (NEURONTIN) 100 MG capsule, Take 100-300 mg by mouth See admin instructions. Take 1 capsule (100mg ) by mouth at dinnertime and take 3  capsules (300mg ) by mouth at bedtime, Disp: , Rfl:  .  Insulin Degludec 200 UNIT/ML SOPN, Inject 8 Units into the skin daily., Disp: 0.5 mL, Rfl: 0 .  irbesartan (AVAPRO) 300 MG tablet, TAKE 1 TABLET BY MOUTH EVERY DAY, Disp: , Rfl:  .  loratadine (CLARITIN REDITABS) 10 MG dissolvable tablet, Take by mouth., Disp: , Rfl:  .  metFORMIN (GLUCOPHAGE-XR) 500 MG 24 hr tablet, Take 1,000 mg by mouth 2 (two) times daily. , Disp: , Rfl:  .  metoprolol succinate (TOPROL-XL) 25 MG 24 hr tablet, Take 12.5 mg by mouth daily. , Disp: , Rfl:  .  polyethylene glycol (MIRALAX / GLYCOLAX) 17 g packet, Take 17 g by mouth daily., Disp: 14 each, Rfl: 0 .  potassium chloride (KLOR-CON) 10 MEQ tablet, Take 1 tablet (10 mEq total) by mouth daily., Disp: 60 tablet, Rfl: 0 .  senna-docusate (SENOKOT-S) 8.6-50 MG tablet, Take 2 tablets by mouth at bedtime., Disp: 10 tablet, Rfl: 0 .  triamcinolone cream (KENALOG) 0.1 %, Apply 1 application topically 2 (two) times daily as needed (skin irritations). , Disp: , Rfl:   Social History   Tobacco Use  Smoking Status Former Smoker  . Packs/day: 0.50  . Years: 50.00  . Pack years: 25.00  .  Types: Cigarettes  Smokeless Tobacco Current User    Allergies  Allergen Reactions  . Lactose Intolerance (Gi)   . Sitagliptin Rash and Other (See Comments)    Januvia   Objective:  There were no vitals filed for this visit. There is no height or weight on file to calculate BMI. Constitutional Well developed. Well nourished.  Vascular Dorsalis pedis pulses palpable bilaterally. Posterior tibial pulses palpable bilaterally. Capillary refill normal to all digits.  No cyanosis or clubbing noted. Pedal hair growth normal.  Neurologic Normal speech. Oriented to person, place, and time. Protective sensation absent  Dermatologic Wound Location: Left posterior leg and left lateral leg with fat layer exposed.  Does not probe down to bone.  No cellulitis or erythema noted.  No malodor  present. Wound Base: Mixed Granular/Fibrotic Peri-wound: Normal Exudate: Scant/small amount Serous exudate Wound Measurements: -See below  Orthopedic: No pain to palpation either foot.   Radiographs: None Assessment:   1. Leg ulcer, left, with fat layer exposed (HCC)   2. Diabetic polyneuropathy associated with type 2 diabetes mellitus (HCC)    Plan:  Patient was evaluated and treated and all questions answered.  Ulcer left arterial leg ulcer x1 with fat layer exposed now with 1 ulcer completely reepithelialized -Debridement as below.. -Continue off-loading with surgical shoe. -ABIs PVR showed adequate flow to the lower extremity to allow for these ulcerations to heal.  Given that patient has good flow I will discuss with her about graft application. -I will hold off on applying tides medical graft and gave him the graft holiday. We will continue applying next time. For now patient will benefit from applying Santyl wet-to-dry dressing changes daily. I discussed this with patient in extensive detail. -Continue Santyl wet-to-dry dressing changes    Procedure: Excisional Debridement of Wound posterior leg  with application of skin substitute/skin graft~decreasing Tool: Sharp chisel blade/tissue nipper Rationale: Removal of non-viable soft tissue from the wound to promote healing.  Anesthesia: none Pre-Debridement Wound Measurements: 1.4 cm  x 1.2 cm  x 0.3 cm Post-Debridement Wound Measurements:1.5 cm  x 1.3 cm  x 0.3 cm Type of Debridement: Sharp Excisional Tissue Removed: Non-viable soft tissue Blood loss: Minimal (<50cc) Depth of Debridement: subcutaneous tissue. Technique: Sharp excisional debridement to bleeding, viable wound base.  Wound Progress: The wound is regressing with continued application of tides medical graft Dressing: Dry, sterile, compression dressing. Disposition: Patient tolerated procedure well. Patient to return in 1 week for follow-up.    No follow-ups  on file.

## 2020-11-18 ENCOUNTER — Ambulatory Visit: Payer: Medicare Other | Admitting: Podiatry

## 2020-11-20 ENCOUNTER — Other Ambulatory Visit: Payer: Self-pay

## 2020-11-20 ENCOUNTER — Ambulatory Visit (INDEPENDENT_AMBULATORY_CARE_PROVIDER_SITE_OTHER): Payer: Medicare Other | Admitting: Podiatry

## 2020-11-20 DIAGNOSIS — Z01818 Encounter for other preprocedural examination: Secondary | ICD-10-CM

## 2020-11-20 DIAGNOSIS — L97922 Non-pressure chronic ulcer of unspecified part of left lower leg with fat layer exposed: Secondary | ICD-10-CM

## 2020-11-20 DIAGNOSIS — E1142 Type 2 diabetes mellitus with diabetic polyneuropathy: Secondary | ICD-10-CM

## 2020-11-20 NOTE — Patient Instructions (Signed)
Pre-Operative Instructions  Congratulations, you have decided to take an important step towards improving your quality of life.  You can be assured that the doctors and staff at [facility] will be with you every step of the way.  Here are some important things you should know:  1. Plan to be at the surgery center/hospital at least 1 (one) hour prior to your scheduled time, unless otherwise directed by the surgical center/hospital staff.  You must have a responsible adult accompany you, remain during the surgery and drive you home.  Make sure you have directions to the surgical center/hospital to ensure you arrive on time. 2. If you are having surgery at [facility] or [facility] hospitals, you will need a copy of your medical history and physical form from your family physician within one month prior to the date of surgery. We will give you a form for your primary physician to complete.  3. We make every effort to accommodate the date you request for surgery.  However, there are times where surgery dates or times have to be moved.  We will contact you as soon as possible if a change in schedule is required.   4. No aspirin/ibuprofen for one week before surgery.  If you are on aspirin, any non-steroidal anti-inflammatory medications (Mobic, Aleve, Ibuprofen) should not be taken seven (7) days prior to your surgery.  You make take Tylenol for pain prior to surgery.  5. Medications - If you are taking daily heart and blood pressure medications, seizure, reflux, allergy, asthma, anxiety, pain or diabetes medications, make sure you notify the surgery center/hospital before the day of surgery so they can tell you which medications you should take or avoid the day of surgery. 6. No food or drink after midnight the night before surgery unless directed otherwise by surgical center/hospital staff. 7. No alcoholic beverages 24-hours prior to surgery.  No smoking 24-hours prior or 24-hours after  surgery. 8. Wear loose pants or shorts. They should be loose enough to fit over bandages, boots, and casts. 9. Don't wear slip-on shoes. Sneakers are preferred. 10. Bring your boot with you to the surgery center/hospital.  Also bring crutches or a walker if your physician has prescribed it for you.  If you do not have this equipment, it will be provided for you after surgery. 11. If you have not been contacted by the surgery center/hospital by the day before your surgery, call to confirm the date and time of your surgery. 12. Leave-time from work may vary depending on the type of surgery you have.  Appropriate arrangements should be made prior to surgery with your employer. 13. Prescriptions will be provided immediately following surgery by your doctor.  Fill these as soon as possible after surgery and take the medication as directed. Pain medications will not be refilled on weekends and must be approved by the doctor. 14. Remove nail polish on the operative foot and avoid getting pedicures prior to surgery. 15. Wash the night before surgery.  The night before surgery wash the foot and leg well with water and the antibacterial soap provided. Be sure to pay special attention to beneath the toenails and in between the toes.  Wash for at least three (3) minutes. Rinse thoroughly with water and dry well with a towel.  Perform this wash unless told not to do so by your physician.  Enclosed: 1 Ice pack (please put in freezer the night before surgery)   1 Hibiclens skin cleaner     Pre-op instructions  If you have any questions regarding the instructions, please do not hesitate to call our office.  [place]: [address], [place], [place] [address] -- [phone]  [place]: [address] [place], [place] [address] -- [phone]  [place]: [address], [place], [place] [address] -- [phone]   Website: [email or web address] 

## 2020-11-21 IMAGING — US US THORACENTESIS ASP PLEURAL SPACE W/IMG GUIDE
1 series · 2 of 2 positions shown · non-contrast
Comparison: None.

CLINICAL DATA: Recurrent large right pleural effusion and shortness
of breath.

EXAM:
ULTRASOUND GUIDED RIGHT THORACENTESIS

[Series 1: us thoracentesis asp pleural space w/img guide · 0.40mm/px · 2 of 2 slices shown]
[im 1/2]
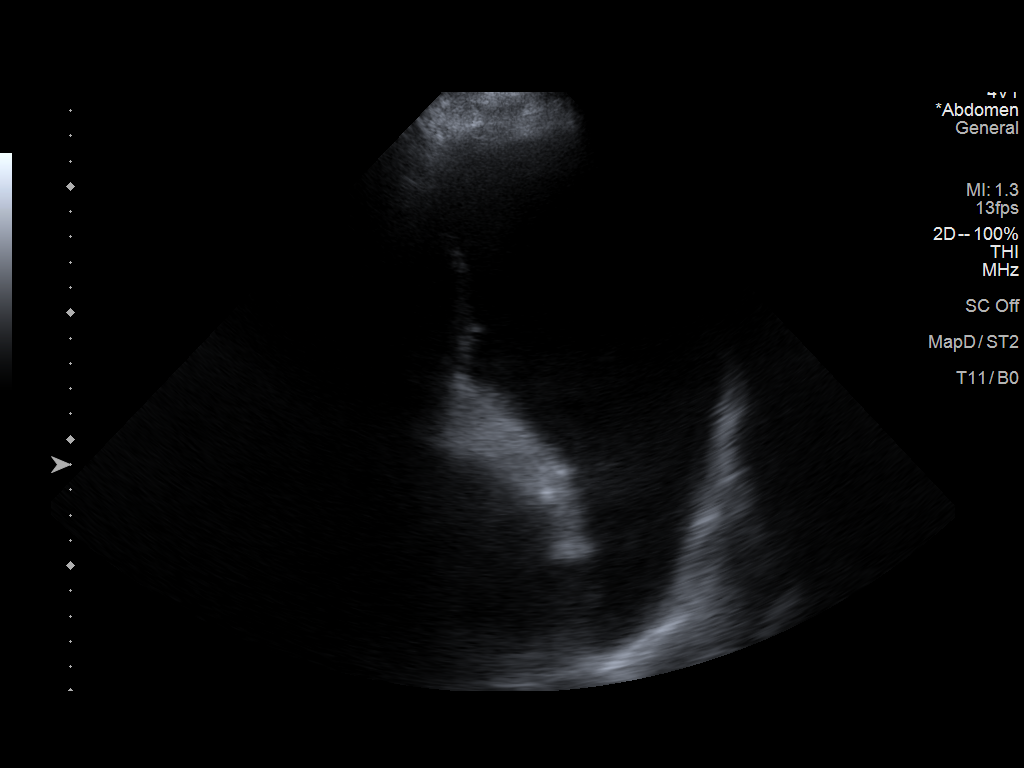
[im 2/2]
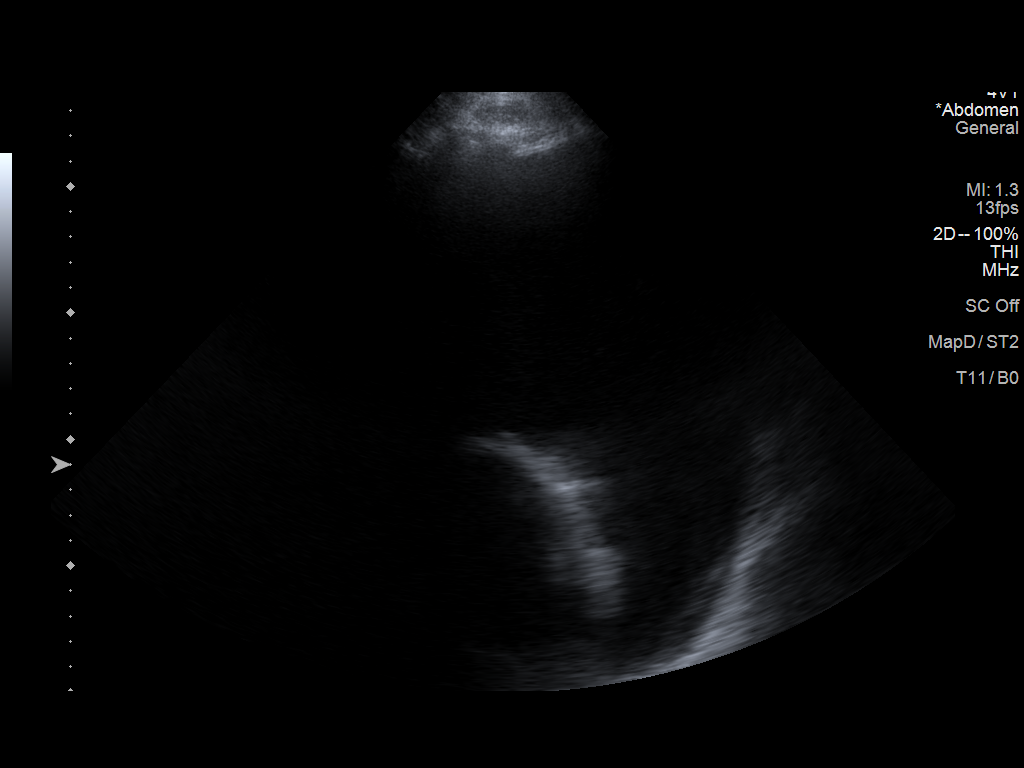

[2 of 2 positions shown; findings below may reference images not displayed]

PROCEDURE:
An ultrasound guided thoracentesis was thoroughly discussed with the
patient and questions answered. The benefits, risks, alternatives
and complications were also discussed. The patient understands and
wishes to proceed with the procedure. Written consent was obtained.

Ultrasound was performed to localize and mark an adequate pocket of
fluid in the right chest. The area was then prepped and draped in
the normal sterile fashion. 1% Lidocaine was used for local
anesthesia. Under ultrasound guidance a 6 French Safe-T-Centesis
catheter was introduced. Thoracentesis was performed. The catheter
was removed and a dressing applied.

COMPLICATIONS:
None
FINDINGS: A total of approximately 2 L of clear, yellow fluid was removed.
IMPRESSION: Successful ultrasound guided right thoracentesis yielding 2 L of
pleural fluid.

Please see post thoracentesis chest x-ray report for additional
findings.

## 2020-11-24 ENCOUNTER — Encounter: Payer: Self-pay | Admitting: Podiatry

## 2020-11-24 NOTE — Progress Notes (Signed)
Subjective:  Patient ID: Jeremiah Chapman, male    DOB: 07-Nov-1946,  MRN: [id number]  Chief Complaint  Patient presents with  . Wound Check    "its about the same, but it has an awful smell"    74 y.o. male presents for wound care.  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  He has been applying Santyl wet-to-dry dressing on it.  The wound has been stagnant.  He would like to discuss surgical options at this time  Review of Systems: Negative except as noted in the HPI. Denies N/V/F/Ch.  Past Medical History:  Diagnosis Date  . Atrial fibrillation (HCC)   . CAD (coronary artery disease)   . CHF (congestive heart failure) (HCC)   . Diabetes mellitus without complication (HCC)   . Gout   . Hypercholesterolemia   . Hypertension   . Pleural effusion     Current Outpatient Medications:  .  albuterol (VENTOLIN HFA) 108 (90 Base) MCG/ACT inhaler, Inhale 2 puffs into the lungs every 6 (six) hours as needed. , Disp: , Rfl:  .  apixaban (ELIQUIS) 5 MG TABS tablet, Take 5 mg by mouth every 12 (twelve) hours. , Disp: , Rfl:  .  aspirin 81 MG EC tablet, Take 81 mg by mouth daily. , Disp: , Rfl:  .  atorvastatin (LIPITOR) 80 MG tablet, TAKE 1 TABLET BY MOUTH EVERY DAY, Disp: , Rfl:  .  collagenase (SANTYL) ointment, Apply 1 application topically daily., Disp: 15 g, Rfl: 0 .  digoxin (LANOXIN) 0.125 MG tablet, TAKE 1 TABLET BY MOUTH EVERY DAY, Disp: , Rfl:  .  doxycycline (VIBRA-TABS) 100 MG tablet, Take 1 tablet (100 mg total) by mouth 2 (two) times daily., Disp: 60 tablet, Rfl: 0 .  FARXIGA 5 MG TABS tablet, Take 5 mg by mouth daily., Disp: , Rfl:  .  fluticasone (FLONASE) 50 MCG/ACT nasal spray, Place into the nose. Place 2 sprays into both nostrils once daily, Disp: , Rfl:  .  furosemide (LASIX) 20 MG tablet, Take 2 tablets (40 mg total) by mouth 2 (two) times daily., Disp: 30 tablet, Rfl: 0 .  gabapentin (NEURONTIN) 100 MG capsule, Take 100-300 mg by mouth See admin instructions.  Take 1 capsule (100mg ) by mouth at dinnertime and take 3 capsules (300mg ) by mouth at bedtime, Disp: , Rfl:  .  Insulin Degludec 200 UNIT/ML SOPN, Inject 8 Units into the skin daily., Disp: 0.5 mL, Rfl: 0 .  irbesartan (AVAPRO) 300 MG tablet, TAKE 1 TABLET BY MOUTH EVERY DAY, Disp: , Rfl:  .  loratadine (CLARITIN REDITABS) 10 MG dissolvable tablet, Take by mouth., Disp: , Rfl:  .  metFORMIN (GLUCOPHAGE-XR) 500 MG 24 hr tablet, Take 1,000 mg by mouth 2 (two) times daily. , Disp: , Rfl:  .  metoprolol succinate (TOPROL-XL) 25 MG 24 hr tablet, Take 12.5 mg by mouth daily. , Disp: , Rfl:  .  polyethylene glycol (MIRALAX / GLYCOLAX) 17 g packet, Take 17 g by mouth daily., Disp: 14 each, Rfl: 0 .  potassium chloride (KLOR-CON) 10 MEQ tablet, Take 1 tablet (10 mEq total) by mouth daily., Disp: 60 tablet, Rfl: 0 .  senna-docusate (SENOKOT-S) 8.6-50 MG tablet, Take 2 tablets by mouth at bedtime., Disp: 10 tablet, Rfl: 0 .  triamcinolone cream (KENALOG) 0.1 %, Apply 1 application topically 2 (two) times daily as needed (skin irritations). , Disp: , Rfl:   Social History   Tobacco Use  Smoking Status Former Smoker  .  Packs/day: 0.50  . Years: 50.00  . Pack years: 25.00  . Types: Cigarettes  Smokeless Tobacco Current User    Allergies  Allergen Reactions  . Lactose Intolerance (Gi)   . Sitagliptin Rash and Other (See Comments)    Januvia   Objective:  There were no vitals filed for this visit. There is no height or weight on file to calculate BMI. Constitutional Well developed. Well nourished.  Vascular Dorsalis pedis pulses palpable bilaterally. Posterior tibial pulses palpable bilaterally. Capillary refill normal to all digits.  No cyanosis or clubbing noted. Pedal hair growth normal.  Neurologic Normal speech. Oriented to person, place, and time. Protective sensation absent  Dermatologic Wound Location: Left posterior leg and left lateral leg with fat layer exposed.  Does not probe  down to bone.  No cellulitis or erythema noted.  No malodor present. Wound Base: Mixed Granular/Fibrotic Peri-wound: Normal Exudate: Scant/small amount Serous exudate Wound Measurements: -See below  Orthopedic: No pain to palpation either foot.   Radiographs: None Assessment:   No diagnosis found. Plan:  Patient was evaluated and treated and all questions answered.  Ulcer left arterial leg ulcer x1 with fat layer exposed now with 1 ulcer completely reepithelialized -Debridement as below.. -Continue off-loading with surgical shoe. -ABIs PVR showed adequate flow to the lower extremity to allow for these ulcerations to heal.  Given that patient has good flow I will discuss with her about graft application. -I will plan on doing aggressive debridement in the operating room with application of Integra ACell graft.  I discussed this with the patient in extensive detail I discussed my surgical plan and type of anesthesia as well.  I believe patient will benefit from excisional debridement of the right leg/posterior wound with application of Integra skin graft.  Patient agrees with the plan would like to proceed with this. -Informed surgical risk consent was reviewed and read aloud to the patient.  I reviewed the films.  I have discussed my findings with the patient in great detail.  I have discussed all risks including but not limited to infection, stiffness, scarring, limp, disability, deformity, damage to blood vessels and nerves, numbness, poor healing, need for braces, arthritis, chronic pain, amputation, death.  All benefits and realistic expectations discussed in great detail.  I have made no promises as to the outcome.  I have provided realistic expectations.  I have offered the patient a 2nd opinion, which they have declined and assured me they preferred to proceed despite the risks -A total of 32 minutes was spent in direct patient care as well as pre and post patient encounter activities.   This includes documentation as well as reviewing patient chart for labs, imaging, past medical, surgical, social, and family history as documented in the EMR.  I have reviewed medication allergies as documented in EMR.  I discussed the etiology of condition and treatment options from conservative to surgical care.  All risks and benefit of the treatment course was discussed in detail.  All questions were answered and return appointment was discussed.  Since the visit completed in an ambulatory/outpatient setting, the patient and/or parent/guardian has been advised to contact the providers office for worsening condition and seek medical treatment and/or call 911 if the patient deems either is necessary.     No follow-ups on file.

## 2020-12-04 ENCOUNTER — Other Ambulatory Visit: Payer: Self-pay

## 2020-12-04 ENCOUNTER — Telehealth: Payer: Self-pay | Admitting: Podiatry

## 2020-12-04 ENCOUNTER — Encounter: Payer: Self-pay | Admitting: Podiatry

## 2020-12-04 ENCOUNTER — Ambulatory Visit (INDEPENDENT_AMBULATORY_CARE_PROVIDER_SITE_OTHER): Payer: Medicare Other | Admitting: Podiatry

## 2020-12-04 DIAGNOSIS — L97922 Non-pressure chronic ulcer of unspecified part of left lower leg with fat layer exposed: Secondary | ICD-10-CM

## 2020-12-04 MED ORDER — SANTYL 250 UNIT/GM EX OINT: 1.0000 "application " | TOPICAL_OINTMENT | Freq: Every day | CUTANEOUS | 1 refills | Status: AC

## 2020-12-04 MED ORDER — DOXYCYCLINE HYCLATE 100 MG PO TABS: 100.0000 mg | ORAL_TABLET | Freq: Two times a day (BID) | ORAL | 0 refills | Status: AC

## 2020-12-04 NOTE — Telephone Encounter (Signed)
I am going to place the fax and the notes that came with it in your [place] inbox.

## 2020-12-04 NOTE — Telephone Encounter (Signed)
It will be light sedation/ MAC for the surgery.

## 2020-12-04 NOTE — Telephone Encounter (Signed)
We received a fax from [facility] with the following note from the provider: "Please let them know that I need to do some tests and get input from cardiology and Pulmonology before clearing him for any procedure, which I consider high risk. Can they also tell me what type of anesthesia is going to be used during the procedure?"

## 2020-12-04 NOTE — Progress Notes (Signed)
Subjective:  Patient ID: Jeremiah Chapman, male    DOB: 1946-05-16,  MRN: [id number]  Chief Complaint  Patient presents with  . Wound Check    Patient presents today for concern for infection LLE.  He says it has a terrible odor and seems to getting bigger   He denies any fever/chills    75 y.o. male presents for wound care.  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  He has been applying Santyl wet-to-dry dressing on it.  The wound has been stagnant.  He would like to discuss surgical options at this time  Review of Systems: Negative except as noted in the HPI. Denies N/V/F/Ch.  Past Medical History:  Diagnosis Date  . Atrial fibrillation (HCC)   . CAD (coronary artery disease)   . CHF (congestive heart failure) (HCC)   . Diabetes mellitus without complication (HCC)   . Gout   . Hypercholesterolemia   . Hypertension   . Pleural effusion     Current Outpatient Medications:  .  doxycycline (VIBRA-TABS) 100 MG tablet, Take 1 tablet (100 mg total) by mouth 2 (two) times daily., Disp: 20 tablet, Rfl: 0 .  albuterol (VENTOLIN HFA) 108 (90 Base) MCG/ACT inhaler, Inhale 2 puffs into the lungs every 6 (six) hours as needed. , Disp: , Rfl:  .  apixaban (ELIQUIS) 5 MG TABS tablet, Take 5 mg by mouth every 12 (twelve) hours. , Disp: , Rfl:  .  aspirin 81 MG EC tablet, Take 81 mg by mouth daily. , Disp: , Rfl:  .  atorvastatin (LIPITOR) 80 MG tablet, TAKE 1 TABLET BY MOUTH EVERY DAY, Disp: , Rfl:  .  collagenase (SANTYL) ointment, Apply 1 application topically daily., Disp: 15 g, Rfl: 1 .  digoxin (LANOXIN) 0.125 MG tablet, TAKE 1 TABLET BY MOUTH EVERY DAY, Disp: , Rfl:  .  doxycycline (VIBRA-TABS) 100 MG tablet, Take 1 tablet (100 mg total) by mouth 2 (two) times daily., Disp: 60 tablet, Rfl: 0 .  FARXIGA 5 MG TABS tablet, Take 5 mg by mouth daily., Disp: , Rfl:  .  fluticasone (FLONASE) 50 MCG/ACT nasal spray, Place into the nose. Place 2 sprays into both nostrils once daily,  Disp: , Rfl:  .  furosemide (LASIX) 20 MG tablet, Take 2 tablets (40 mg total) by mouth 2 (two) times daily., Disp: 30 tablet, Rfl: 0 .  gabapentin (NEURONTIN) 100 MG capsule, Take 100-300 mg by mouth See admin instructions. Take 1 capsule (100mg ) by mouth at dinnertime and take 3 capsules (300mg ) by mouth at bedtime, Disp: , Rfl:  .  Insulin Degludec 200 UNIT/ML SOPN, Inject 8 Units into the skin daily., Disp: 0.5 mL, Rfl: 0 .  irbesartan (AVAPRO) 300 MG tablet, TAKE 1 TABLET BY MOUTH EVERY DAY, Disp: , Rfl:  .  loratadine (CLARITIN REDITABS) 10 MG dissolvable tablet, Take by mouth., Disp: , Rfl:  .  metFORMIN (GLUCOPHAGE-XR) 500 MG 24 hr tablet, Take 1,000 mg by mouth 2 (two) times daily. , Disp: , Rfl:  .  metoprolol succinate (TOPROL-XL) 25 MG 24 hr tablet, Take 12.5 mg by mouth daily. , Disp: , Rfl:  .  polyethylene glycol (MIRALAX / GLYCOLAX) 17 g packet, Take 17 g by mouth daily., Disp: 14 each, Rfl: 0 .  potassium chloride (KLOR-CON) 10 MEQ tablet, Take 1 tablet (10 mEq total) by mouth daily., Disp: 60 tablet, Rfl: 0 .  senna-docusate (SENOKOT-S) 8.6-50 MG tablet, Take 2 tablets by mouth at bedtime., Disp:  10 tablet, Rfl: 0 .  triamcinolone cream (KENALOG) 0.1 %, Apply 1 application topically 2 (two) times daily as needed (skin irritations). , Disp: , Rfl:   Social History   Tobacco Use  Smoking Status Former Smoker  . Packs/day: 0.50  . Years: 50.00  . Pack years: 25.00  . Types: Cigarettes  Smokeless Tobacco Current User    Allergies  Allergen Reactions  . Lactose Intolerance (Gi)   . Sitagliptin Rash and Other (See Comments)    Januvia   Objective:  There were no vitals filed for this visit. There is no height or weight on file to calculate BMI. Constitutional Well developed. Well nourished.  Vascular Dorsalis pedis pulses palpable bilaterally. Posterior tibial pulses palpable bilaterally. Capillary refill normal to all digits.  No cyanosis or clubbing noted. Pedal  hair growth normal.  Neurologic Normal speech. Oriented to person, place, and time. Protective sensation absent  Dermatologic Wound Location: Left posterior leg and left lateral leg with fat layer exposed.  Does not probe down to bone.  No cellulitis or erythema noted.  No malodor present. Wound Base: Mixed Granular/Fibrotic Peri-wound: Normal Exudate: Scant/small amount Serous exudate Wound Measurements: -See below  Orthopedic: No pain to palpation either foot.   Radiographs: None Assessment:   1. Leg ulcer, left, with fat layer exposed ([place])    Plan:  Patient was evaluated and treated and all questions answered.  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 -Continue off-loading with surgical shoe. -ABIs PVR showed adequate flow to the lower extremity to allow for these ulcerations to heal.   -I will also send in a referral to the wound care center in case if patient cannot go to surgery and will need to be managed afterwards with advanced wound care. -I will plan on doing aggressive debridement in the operating room with application of Integra ACell graft.  I discussed this with the patient in extensive detail I discussed my surgical plan and type of anesthesia as well.  I believe patient will benefit from excisional debridement of the right leg/posterior wound with application of Integra skin graft.  Patient agrees with the plan would like to proceed with this. -Informed surgical risk consent was reviewed and read aloud to the patient.  I reviewed the films.  I have discussed my findings with the patient in great detail.  I have discussed all risks including but not limited to infection, stiffness, scarring, limp, disability, deformity, damage to blood vessels and nerves, numbness, poor healing, need for braces, arthritis, chronic pain, amputation, death.  All benefits and realistic expectations discussed in great detail.  I have made no promises  as to the outcome.  I have provided realistic expectations.  I have offered the patient a 2nd opinion, which they have declined and assured me they preferred to proceed despite the risks -A total of 32 minutes was spent in direct patient care as well as pre and post patient encounter activities.  This includes documentation as well as reviewing patient chart for labs, imaging, past medical, surgical, social, and family history as documented in the EMR.  I have reviewed medication allergies as documented in EMR.  I discussed the etiology of condition and treatment options from conservative to surgical care.  All risks and benefit of the treatment course was discussed in detail.  All questions were answered and return appointment was discussed.  Since the visit completed in an ambulatory/outpatient setting, the patient and/or parent/guardian  has been advised to contact the providers office for worsening condition and seek medical treatment and/or call 911 if the patient deems either is necessary.     No follow-ups on file.

## 2020-12-10 ENCOUNTER — Telehealth: Payer: Self-pay

## 2020-12-10 NOTE — Telephone Encounter (Signed)
DOS 12/19/2020  DEBRIDEMENT ULCER LT LEG - 11043  PER AUTOMATED SYSTEM AT [employer], SINCE MEDICARE IS PRIMARY, NO PRECERT REQUIRED.

## 2020-12-15 NOTE — Progress Notes (Signed)
Per [facility] Guidelines, pt will need to be done at Main OR. 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 Dr Eliane Decree office is closed today so I will call tomorrow to have him move patient.

## 2020-12-16 ENCOUNTER — Encounter: Payer: Medicare Other | Attending: Physician Assistant | Admitting: Physician Assistant

## 2020-12-16 ENCOUNTER — Other Ambulatory Visit: Payer: Self-pay

## 2020-12-16 DIAGNOSIS — N183 Chronic kidney disease, stage 3 unspecified: Secondary | ICD-10-CM | POA: Diagnosis not present

## 2020-12-16 DIAGNOSIS — E11622 Type 2 diabetes mellitus with other skin ulcer: Secondary | ICD-10-CM | POA: Diagnosis present

## 2020-12-16 DIAGNOSIS — I130 Hypertensive heart and chronic kidney disease with heart failure and stage 1 through stage 4 chronic kidney disease, or unspecified chronic kidney disease: Secondary | ICD-10-CM | POA: Insufficient documentation

## 2020-12-16 DIAGNOSIS — I872 Venous insufficiency (chronic) (peripheral): Secondary | ICD-10-CM | POA: Diagnosis not present

## 2020-12-16 DIAGNOSIS — L97822 Non-pressure chronic ulcer of other part of left lower leg with fat layer exposed: Secondary | ICD-10-CM | POA: Diagnosis not present

## 2020-12-16 DIAGNOSIS — I5042 Chronic combined systolic (congestive) and diastolic (congestive) heart failure: Secondary | ICD-10-CM | POA: Diagnosis not present

## 2020-12-16 DIAGNOSIS — E1122 Type 2 diabetes mellitus with diabetic chronic kidney disease: Secondary | ICD-10-CM | POA: Diagnosis not present

## 2020-12-19 ENCOUNTER — Ambulatory Visit: Admission: RE | Admit: 2020-12-19 | Payer: Medicare Other | Source: Home / Self Care | Admitting: Podiatry

## 2020-12-19 ENCOUNTER — Encounter: Admission: RE | Payer: Self-pay | Source: Home / Self Care

## 2020-12-19 SURGERY — IRRIGATION AND DEBRIDEMENT FOOT
Anesthesia: Monitor Anesthesia Care | Laterality: Left

## 2020-12-19 SURGERY — Surgical Case
Anesthesia: *Unknown

## 2020-12-19 NOTE — Progress Notes (Signed)
Jeremiah, Chapman ([id number]) Visit Report for 12/16/2020 Chief Complaint Document Details Patient Name: Jeremiah Chapman, Jeremiah Chapman. Date of Service: 12/16/2020 8:30 AM Medical Record Number: [id number] Patient Account Number: [id number] Date of Birth/Sex: 04-Oct-1946 (75 y.o. M) Treating RN: Huel Coventry Primary Care Provider: Dorothey Baseman Other Clinician: Referring Provider: Dorothey Baseman Treating Provider/Extender: Rowan Blase in Treatment: 0 Information Obtained from: Patient Chief Complaint Left LE Ulcer Electronic Signature(s) Signed: 12/16/2020 10:37:56 AM By: Lenda Kelp PA-C Entered By: Lenda Kelp on 12/16/2020 10:37:56 Linsley, Eliott Nine ([id number]) -------------------------------------------------------------------------------- HPI Details Patient Name: Jeremiah Chapman. Date of Service: 12/16/2020 8:30 AM Medical Record Number: [id number] Patient Account Number: [id number] Date of Birth/Sex: 1946-10-04 (75 y.o. M) Treating RN: Huel Coventry Primary Care Provider: Dorothey Baseman Other Clinician: Referring Provider: Dorothey Baseman Treating Provider/Extender: Rowan Blase in Treatment: 0 History of Present Illness HPI Description: 12/16/2020 patient presents for initial evaluation here in the clinic concerning an issue but has been having with wounds over the left posterior lower leg. The good news is this does not appear to be doing too poorly in my opinion. In fact it appeared to be very clean with no slough buildup. He did have some dry skin around the edges I was able to mechanically debride this way no sharp debridement was necessary today. With that being said he does have an extensive medical history in general and he tells me that his podiatrist, was considering the possibility of taking him to the OR to apply ACell to the wound. With that being said however the patient has not been cleared to be able to go to the ER by his primary care provider in fact  he stated he did not want to sign off on it unless the patient was able to get clearance from his pulmonologist and cardiologist. Again the patient does have an extensive past medical history which includes diabetes mellitus type 2, chronic venous insufficiency, congestive heart failure, hypertension, and chronic kidney disease stage III. He is also on oxygen currently. All of which combines into a scenario where it is really not ideal for him to go in for an elective surgery of any kind to be honest. Subsequently the patient also really does not want to undergo any type of OR procedure at this point if he can avoid it. For that reason I think we can definitely be of benefit as far as trying to help the patient with getting this wound to heal. I think that with appropriate compression and wound care we may not even require any skin substitutes. Nonetheless I explained to the patient that if we need to use a skin substitute we have some things we can utilize in office without having to go to the OR. The patient is currently on doxycycline. Electronic Signature(s) Signed: 12/16/2020 4:46:58 PM By: Lenda Kelp PA-C Entered By: Lenda Kelp on 12/16/2020 16:46:58 Odle, Eliott Nine ([id number]) -------------------------------------------------------------------------------- Physical Exam Details Patient Name: Jeremiah Chapman. Date of Service: 12/16/2020 8:30 AM Medical Record Number: [id number] Patient Account Number: [id number] Date of Birth/Sex: January 23, 1946 (75 y.o. M) Treating RN: Huel Coventry Primary Care Provider: Dorothey Baseman Other Clinician: Referring Provider: Dorothey Baseman Treating Provider/Extender: Rowan Blase in Treatment: 0 Constitutional sitting or standing blood pressure is within target range for patient.. pulse regular and within target range for patient.Marland Kitchen respirations regular, non- labored and within target range for patient.Marland Kitchen temperature within target range for  patient.. Well-nourished and well-hydrated in no acute distress. Eyes  conjunctiva clear no eyelid edema noted. pupils equal round and reactive to light and accommodation. Ears, Nose, Mouth, and Throat no gross abnormality of ear auricles or external auditory canals. normal hearing noted during conversation. mucus membranes moist. Respiratory normal breathing without difficulty Though the patient is currently on oxygen all the time.. Cardiovascular 2+ dorsalis pedis/posterior tibialis pulses. 1+ pitting edema of the bilateral lower extremities. Musculoskeletal Patient unable to walk without assistance devices. Psychiatric this patient is able to make decisions and demonstrates good insight into disease process. Alert and Oriented x 3. pleasant and cooperative. Notes Upon inspection patient's wound bed actually showed signs of good granulation. There was no evidence of necrotic tissue noted at this point and to be honest I was able to mechanically debride away anything that was sitting on the surface of the wound as far as biofilm buildup. The patient does not seem to have any significant pain which is good news. Overall I am extremely pleased with where things stand in that regard. Electronic Signature(s) Signed: 12/16/2020 4:48:02 PM By: Lenda Kelp PA-C Entered By: Lenda Kelp on 12/16/2020 16:48:01 Schoffstall, Eliott Nine ([id number]) -------------------------------------------------------------------------------- Physician Orders Details Patient Name: Jeremiah Chapman. Date of Service: 12/16/2020 8:30 AM Medical Record Number: [id number] Patient Account Number: [id number] Date of Birth/Sex: December 15, 1945 (75 y.o. M) Treating RN: Yevonne Pax Primary Care Provider: Dorothey Baseman Other Clinician: Referring Provider: Dorothey Baseman Treating Provider/Extender: Rowan Blase in Treatment: 0 Verbal / Phone Orders: No Diagnosis Coding ICD-10 Coding Code Description E11.622 Type 2  diabetes mellitus with other skin ulcer I87.2 Venous insufficiency (chronic) (peripheral) L97.822 Non-pressure chronic ulcer of other part of left lower leg with fat layer exposed I50.42 Chronic combined systolic (congestive) and diastolic (congestive) heart failure I10 Essential (primary) hypertension N18.30 Chronic kidney disease, stage 3 unspecified Follow-up Appointments o Return Appointment in 1 week. Bathing/ Shower/ Hygiene o May shower with wound dressing protected with water repellent cover or cast protector. Edema Control - Lymphedema / Segmental Compressive Device / Other o Elevate, Exercise Daily and Avoid Standing for Long Periods of Time. o Elevate legs to the level of the heart and pump ankles as often as possible o Elevate leg(s) parallel to the floor when sitting. o DO YOUR BEST to sleep in the bed at night. DO NOT sleep in your recliner. Long hours of sitting in a recliner leads to swelling of the legs and/or potential wounds on your backside. Wound Treatment Wound #1 - Lower Leg Wound Laterality: Left, Posterior Cleanser: Normal Saline (Dispense As Written) 1 x Per Week/30 Days Discharge Instructions: Wash your hands with soap and water. Remove old dressing, discard into plastic bag and place into trash. Cleanse the wound with Normal Saline prior to applying a clean dressing using gauze sponges, not tissues or cotton balls. Do not scrub or use excessive force. Pat dry using gauze sponges, not tissue or cotton balls. Primary Dressing: Prisma 4.34 (in) (Dispense As Written) 1 x Per Week/30 Days Discharge Instructions: Moisten w/normal saline or sterile water; Cover wound as directed. Do not remove from wound bed. Secondary Dressing: Bordered Gauze Sterile-HBD 4x4 (in/in) (Generic) 1 x Per Week/30 Days Discharge Instructions: Cover wound with Bordered Guaze Sterile as directed Compression Wrap: Profore Lite LF 3 Multilayer Compression Bandaging System (Generic) 1  x Per Week/30 Days Discharge Instructions: Apply 3 multi-layer wrap as prescribed. Compression Wrap: Unna Boot 4x10 (in/yd) 1 x Per Week/30 Days Discharge Instructions: just below knee to anchor compression Electronic Signature(s) Signed: 12/16/2020  5:06:45 PM By: Lenda KelpStone III, Roniqua Kintz PA-C Signed: 12/19/2020 10:25:07 AM By: Yevonne PaxEpps, Carrie RN Entered By: Yevonne PaxEpps, Carrie on 12/16/2020 10:48:24 Jeremiah MoralesHEPLER, Efrem D. ([id number]) -------------------------------------------------------------------------------- Problem List Details Patient Name: Jeremiah MoralesHEPLER, Kazim D. Date of Service: 12/16/2020 8:30 AM Medical Record Number: [id number] Patient Account Number: [id number] Date of Birth/Sex: 05/20/1946 [age] y.o. M) Treating RN: Huel CoventryWoody, Kim Primary Care Provider: [name], David Other Clinician: Referring Provider: [name], David Treating Provider/Extender: Rowan BlaseStone, Minta Fair Weeks in Treatment: 0 Active Problems ICD-10 Encounter Code Description Active Date MDM Diagnosis E11.622 Type 2 diabetes mellitus with other skin ulcer 12/16/2020 No Yes I87.2 Venous insufficiency (chronic) (peripheral) 12/16/2020 No Yes L97.822 Non-pressure chronic ulcer of other part of left lower leg with fat layer 12/16/2020 No Yes exposed I50.42 Chronic combined systolic (congestive) and diastolic (congestive) heart 12/16/2020 No Yes failure I10 Essential (primary) hypertension 12/16/2020 No Yes N18.30 Chronic kidney disease, stage 3 unspecified 12/16/2020 No Yes Inactive Problems Resolved Problems Electronic Signature(s) Signed: 12/16/2020 10:37:35 AM By: Lenda KelpStone III, Zair Borawski PA-C Entered By: Lenda KelpStone III, Maahi Lannan on 12/16/2020 10:37:35 Kamaka, Eliott NineKENNETH D. ([id number]) -------------------------------------------------------------------------------- Progress Note Details Patient Name: Arva ChafeHEPLER, Coby D. Date of Service: 12/16/2020 8:30 AM Medical Record Number: [id number] Patient Account Number: [id number] Date of Birth/Sex: 02/28/1946 [age] y.o.  M) Treating RN: Huel CoventryWoody, Kim Primary Care Provider: [name], David Other Clinician: Referring Provider: [name], David Treating Provider/Extender: Rowan BlaseStone, Kadey Mihalic Weeks in Treatment: 0 Subjective Chief Complaint Information obtained from Patient Left LE Ulcer History of Present Illness (HPI) 12/16/2020 patient presents for initial evaluation here in the clinic concerning an issue but has been having with wounds over the left posterior lower leg. The good news is this does not appear to be doing too poorly in my opinion. In fact it appeared to be very clean with no slough buildup. He did have some dry skin around the edges I was able to mechanically debride this way no sharp debridement was necessary today. With that being said he does have an extensive medical history in general and he tells me that his podiatrist, was considering the possibility of taking him to the OR to apply ACell to the wound. With that being said however the patient has not been cleared to be able to go to the ER by his primary care provider in fact he stated he did not want to sign off on it unless the patient was able to get clearance from his pulmonologist and cardiologist. Again the patient does have an extensive past medical history which includes diabetes mellitus type 2, chronic venous insufficiency, congestive heart failure, hypertension, and chronic kidney disease stage III. He is also on oxygen currently. All of which combines into a scenario where it is really not ideal for him to go in for an elective surgery of any kind to be honest. Subsequently the patient also really does not want to undergo any type of OR procedure at this point if he can avoid it. For that reason I think we can definitely be of benefit as far as trying to help the patient with getting this wound to heal. I think that with appropriate compression and wound care we may not even require any skin substitutes. Nonetheless I explained to the  patient that if we need to use a skin substitute we have some things we can utilize in office without having to go to the OR. The patient is currently on doxycycline. Patient History Information obtained from Patient. Allergies sitagliptin Family History Cancer - Mother, Heart Disease - Mother, No family  history of Diabetes, Hereditary Spherocytosis, Hypertension, Kidney Disease, Lung Disease, Seizures, Stroke, Thyroid Problems, Tuberculosis. Social History Former smoker, Marital Status - Divorced, Alcohol Use - Moderate, Drug Use - No History, Caffeine Use - Moderate. Medical History Eyes Denies history of Cataracts, Glaucoma, Optic Neuritis Ear/Nose/Mouth/Throat Denies history of Chronic sinus problems/congestion, Middle ear problems Hematologic/Lymphatic Denies history of Anemia, Hemophilia, Human Immunodeficiency Virus, Lymphedema, Sickle Cell Disease Respiratory Denies history of Aspiration, Asthma, Chronic Obstructive Pulmonary Disease (COPD), Pneumothorax, Sleep Apnea, Tuberculosis Cardiovascular Patient has history of Congestive Heart Failure, Coronary Artery Disease, Hypertension, Myocardial Infarction Denies history of Angina, Arrhythmia, Deep Vein Thrombosis, Hypotension, Peripheral Arterial Disease, Peripheral Venous Disease, Phlebitis, Vasculitis Gastrointestinal Denies history of Cirrhosis , Colitis, Crohn s, Hepatitis A, Hepatitis B, Hepatitis C Endocrine Patient has history of Type II Diabetes - 2 years Denies history of Type I Diabetes Genitourinary Denies history of End Stage Renal Disease Immunological Denies history of Lupus Erythematosus, Raynaud s, Scleroderma Integumentary (Skin) Denies history of History of Burn, History of pressure wounds Musculoskeletal Denies history of Gout, Rheumatoid Arthritis, Osteoarthritis, Osteomyelitis Neurologic Patient has history of Neuropathy Denies history of Dementia, Quadriplegia, Paraplegia, Seizure  Disorder Oncologic TISON, LEIBOLD ([id number]) Denies history of Received Chemotherapy, Received Radiation Psychiatric Denies history of Anorexia/bulimia, Confinement Anxiety Patient is treated with Insulin, Oral Agents. Review of Systems (ROS) Constitutional Symptoms (General Health) Denies complaints or symptoms of Fatigue, Fever, Chills, Marked Weight Change. Eyes Denies complaints or symptoms of Dry Eyes, Vision Changes, Glasses / Contacts. Ear/Nose/Mouth/Throat Denies complaints or symptoms of Difficult clearing ears, Sinusitis. Hematologic/Lymphatic Denies complaints or symptoms of Bleeding / Clotting Disorders, Human Immunodeficiency Virus. Respiratory Denies complaints or symptoms of Chronic or frequent coughs, Shortness of Breath. Cardiovascular Denies complaints or symptoms of Chest pain, LE edema. Gastrointestinal Denies complaints or symptoms of Frequent diarrhea, Nausea, Vomiting. Endocrine Denies complaints or symptoms of Hepatitis, Thyroid disease, Polydypsia (Excessive Thirst). Genitourinary Denies complaints or symptoms of Kidney failure/ Dialysis, Incontinence/dribbling. Immunological Denies complaints or symptoms of Hives, Itching. Integumentary (Skin) Complains or has symptoms of Wounds, Swelling. Musculoskeletal Denies complaints or symptoms of Muscle Pain, Muscle Weakness. Neurologic Denies complaints or symptoms of Numbness/parasthesias, Focal/Weakness. Psychiatric Denies complaints or symptoms of Anxiety, Claustrophobia. Objective Constitutional sitting or standing blood pressure is within target range for patient.. pulse regular and within target range for patient.Marland Kitchen respirations regular, non- labored and within target range for patient.Marland Kitchen temperature within target range for patient.. Well-nourished and well-hydrated in no acute distress. Vitals Time Taken: 10:16 AM, Height: 69 in, Source: Stated, Weight: 165 lbs, Source: Stated, BMI: 24.4,  Temperature: 98.5 F, Pulse: 68 bpm, Respiratory Rate: 22 breaths/min, Blood Pressure: 130/63 mmHg. Eyes conjunctiva clear no eyelid edema noted. pupils equal round and reactive to light and accommodation. Ears, Nose, Mouth, and Throat no gross abnormality of ear auricles or external auditory canals. normal hearing noted during conversation. mucus membranes moist. Respiratory normal breathing without difficulty Though the patient is currently on oxygen all the time.. Cardiovascular 2+ dorsalis pedis/posterior tibialis pulses. 1+ pitting edema of the bilateral lower extremities. Musculoskeletal Patient unable to walk without assistance devices. Psychiatric this patient is able to make decisions and demonstrates good insight into disease process. Alert and Oriented x 3. pleasant and cooperative. General Notes: Upon inspection patient's wound bed actually showed signs of good granulation. There was no evidence of necrotic tissue noted at this point and to be honest I was able to mechanically debride away anything that was sitting on the surface of the wound as  far as biofilm buildup. The patient does not seem to have any significant pain which is good news. Overall I am extremely pleased with where things stand in that regard. RAMAN, FEATHERSTON ([id number]) Integumentary (Hair, Skin) Wound #1 status is Open. Original cause of wound was Blister. The wound is located on the Left,Posterior Lower Leg. The wound measures 3cm length x 3.5cm width x 0.1cm depth; 8.247cm^2 area and 0.825cm^3 volume. There is Fat Layer (Subcutaneous Tissue) exposed. There is no tunneling or undermining noted. There is a medium amount of serosanguineous drainage noted. There is large (67-100%) pink granulation within the wound bed. There is a small (1-33%) amount of necrotic tissue within the wound bed including Adherent Slough. 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 Bathing/ Shower/ Hygiene: May shower with wound dressing protected with water repellent cover or cast protector. Edema Control - Lymphedema / Segmental Compressive Device / Other: Elevate, Exercise Daily and Avoid Standing for Long Periods of Time. Elevate legs to the level of the heart and pump ankles as often as possible Elevate leg(s) parallel to the floor when sitting. DO YOUR BEST to sleep in the bed at night. DO NOT sleep in your recliner. Long hours of sitting in a recliner leads to swelling of the legs and/or potential wounds on your backside. WOUND #1: - Lower Leg Wound Laterality: Left, Posterior Cleanser: Normal Saline (Dispense As Written) 1 x Per Week/30 Days Discharge Instructions: Wash your hands with soap and water. Remove old dressing, discard into plastic bag and place into trash. Cleanse the wound with Normal Saline prior to applying a clean dressing using gauze sponges, not tissues or cotton balls. Do not scrub or use excessive force. Pat dry using gauze sponges, not tissue or cotton balls. Primary Dressing: Prisma 4.34 (in) (Dispense As Written) 1 x Per Week/30 Days Discharge Instructions: Moisten w/normal saline or sterile water; Cover wound as directed. Do not remove from wound bed. Secondary Dressing: Bordered Gauze Sterile-HBD 4x4 (in/in) (Generic) 1 x Per Week/30 Days Discharge Instructions: Cover wound with Bordered Guaze Sterile as directed Compression Wrap: Profore Lite LF 3 Multilayer Compression Bandaging System (Generic) 1 x Per Week/30 Days Discharge Instructions: Apply 3 multi-layer wrap as prescribed. Compression Wrap: Unna  Boot 4x10 (in/yd) 1 x Per Week/30 Days Discharge Instructions: just below knee to anchor compression 1. Would recommend currently that we go ahead and initiate treatment with a silver collagen dressing which I think actually is good to do quite well for this patient. 2. I would also recommend that we utilize a ABD pad over top of this followed by 3 layer compression wrap which I am hopeful will help him to heal effectively and quickly here. That should take care of a lot of the edema that is noted in the leg at this point. 3. Muscle can recommend the patient continue to elevate his leg is much as possible in order to keep edema under control. We will see patient back for reevaluation in 1 week here in the clinic. If anything worsens or changes patient will contact our office for additional recommendations. Electronic Signature(s) Signed: 12/16/2020 4:49:26 PM By: Lenda Kelp PA-C Entered By: Lenda Kelp on 12/16/2020 16:49:26 Nedved,  Eliott NineKENNETH D. ([id number]) -------------------------------------------------------------------------------- ROS/PFSH Details Patient Name: Jeremiah MoralesHEPLER, Muhammad D. Date of Service: 12/16/2020 8:30 AM Medical Record Number: [id number] Patient Account Number: [id number] Date of Birth/Sex: 10/08/1946 [age] y.o. M) Treating RN: Yevonne PaxEpps, Carrie Primary Care Provider: [name], David Other Clinician: Referring Provider: [name], David Treating Provider/Extender: Rowan BlaseStone, Khing Belcher Weeks in Treatment: 0 Information Obtained From Patient Constitutional Symptoms (General Health) Complaints and Symptoms: Negative for: Fatigue; Fever; Chills; Marked Weight Change Eyes Complaints and Symptoms: Negative for: Dry Eyes; Vision Changes; Glasses / Contacts Medical History: Negative for: Cataracts; Glaucoma; Optic Neuritis Ear/Nose/Mouth/Throat Complaints and Symptoms: Negative for: Difficult clearing ears; Sinusitis Medical History: Negative for: Chronic sinus problems/congestion;  Middle ear problems Hematologic/Lymphatic Complaints and Symptoms: Negative for: Bleeding / Clotting Disorders; Human Immunodeficiency Virus Medical History: Negative for: Anemia; Hemophilia; Human Immunodeficiency Virus; Lymphedema; Sickle Cell Disease Respiratory Complaints and Symptoms: Negative for: Chronic or frequent coughs; Shortness of Breath Medical History: Negative for: Aspiration; Asthma; Chronic Obstructive Pulmonary Disease (COPD); Pneumothorax; Sleep Apnea; Tuberculosis Cardiovascular Complaints and Symptoms: Negative for: Chest pain; LE edema Medical History: Positive for: Congestive Heart Failure; Coronary Artery Disease; Hypertension; Myocardial Infarction Negative for: Angina; Arrhythmia; Deep Vein Thrombosis; Hypotension; Peripheral Arterial Disease; Peripheral Venous Disease; Phlebitis; Vasculitis Gastrointestinal Complaints and Symptoms: Negative for: Frequent diarrhea; Nausea; Vomiting Medical History: Negative for: Cirrhosis ; Colitis; Crohnos; Hepatitis A; Hepatitis B; Hepatitis C Endocrine Jeremiah MoralesHEPLER, Hilbert D. ([id number]) Complaints and Symptoms: Negative for: Hepatitis; Thyroid disease; Polydypsia (Excessive Thirst) Medical History: Positive for: Type II Diabetes - 2 years Negative for: Type I Diabetes Time with diabetes: 2 years Treated with: Insulin, Oral agents Genitourinary Complaints and Symptoms: Negative for: Kidney failure/ Dialysis; Incontinence/dribbling Medical History: Negative for: End Stage Renal Disease Immunological Complaints and Symptoms: Negative for: Hives; Itching Medical History: Negative for: Lupus Erythematosus; Raynaudos; Scleroderma Integumentary (Skin) Complaints and Symptoms: Positive for: Wounds; Swelling Medical History: Negative for: History of Burn; History of pressure wounds Musculoskeletal Complaints and Symptoms: Negative for: Muscle Pain; Muscle Weakness Medical History: Negative for: Gout; Rheumatoid  Arthritis; Osteoarthritis; Osteomyelitis Neurologic Complaints and Symptoms: Negative for: Numbness/parasthesias; Focal/Weakness Medical History: Positive for: Neuropathy Negative for: Dementia; Quadriplegia; Paraplegia; Seizure Disorder Psychiatric Complaints and Symptoms: Negative for: Anxiety; Claustrophobia Medical History: Negative for: Anorexia/bulimia; Confinement Anxiety Oncologic Medical History: Negative for: Received Chemotherapy; Received Radiation Immunizations Pneumococcal Vaccine: Received Pneumococcal Vaccination: No Implantable Devices None Jeremiah MoralesHEPLER, Arath D. ([id number]) Family and Social History Cancer: Yes - Mother; Diabetes: No; Heart Disease: Yes - Mother; Hereditary Spherocytosis: No; Hypertension: No; Kidney Disease: No; Lung Disease: No; Seizures: No; Stroke: No; Thyroid Problems: No; Tuberculosis: No; Former smoker; Marital Status - Divorced; Alcohol Use: Moderate; Drug Use: No History; Caffeine Use: Moderate; Financial Concerns: No; Food, Clothing or Shelter Needs: No; Support System Lacking: No; Transportation Concerns: No Electronic Signature(s) Signed: 12/16/2020 5:06:45 PM By: Lenda KelpStone III, Randale Carvalho PA-C Signed: 12/19/2020 10:25:07 AM By: Yevonne PaxEpps, Carrie RN Entered By: Yevonne PaxEpps, Carrie on 12/16/2020 10:24:31 Jeremiah MoralesHEPLER, Jedrick D. ([id number]) -------------------------------------------------------------------------------- SuperBill Details Patient Name: Jeremiah MoralesHEPLER, Trevino D. Date of Service: 12/16/2020 Medical Record Number: [id number] Patient Account Number: [id number] Date of Birth/Sex: 03/02/1946 [age] y.o. 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  Essential (primary) hypertension N18.30 Chronic kidney disease, stage 3 unspecified Facility Procedures CPT4 Code: 16109604 Description: 99213 - WOUND CARE VISIT-LEV 3 EST PT Modifier: Quantity: 1 Physician Procedures CPT4 Code: 5409811 Description: WC PHYS LEVEL 3 o NEW PT Modifier: Quantity: 1 CPT4 Code: Description: ICD-10 Diagnosis Description E11.622 Type 2 diabetes mellitus with other skin ulcer I87.2 Venous insufficiency (chronic) (peripheral) L97.822 Non-pressure chronic ulcer of other part of left lower leg with fat l I50.42 Chronic combined  systolic (congestive) and diastolic (congestive) hea Modifier: ayer exposed rt failure Quantity: Electronic Signature(s) Signed: 12/16/2020 4:49:38 PM By: Lenda Kelp PA-C Entered By: Lenda Kelp on 12/16/2020 16:49:38

## 2020-12-19 NOTE — Progress Notes (Signed)
Jeremiah, Chapman ([id number]) Visit Report for 12/16/2020 Abuse/Suicide Risk Screen Details Patient Name: Jeremiah Chapman, Jeremiah Chapman. Date of Service: 12/16/2020 8:30 AM Medical Record Number: [id number] Patient Account Number: [id number] Date of Birth/Sex: 1946-11-18 (75 y.o. M) Treating RN: Yevonne Pax Primary Care Anesa Fronek: Dorothey Baseman Other Clinician: Referring Fidela Cieslak: Dorothey Baseman Treating Jaime Dome/Extender: Rowan Blase in Treatment: 0 Abuse/Suicide Risk Screen Items Answer ABUSE RISK SCREEN: Has anyone close to you tried to hurt or harm you recentlyo No Do you feel uncomfortable with anyone in your familyo No Has anyone forced you do things that you didnot want to doo No Electronic Signature(s) Signed: 12/19/2020 10:25:07 AM By: Yevonne Pax RN Entered By: Yevonne Pax on 12/16/2020 10:24:39 Hagood, Eliott Nine ([id number]) -------------------------------------------------------------------------------- Activities of Daily Living Details Patient Name: Jeremiah Chapman. Date of Service: 12/16/2020 8:30 AM Medical Record Number: [id number] Patient Account Number: [id number] Date of Birth/Sex: 09/29/1946 (75 y.o. M) Treating RN: Yevonne Pax Primary Care Deziya Amero: Dorothey Baseman Other Clinician: Referring Cathaleen Korol: Dorothey Baseman Treating Kiaraliz Rafuse/Extender: Rowan Blase in Treatment: 0 Activities of Daily Living Items Answer Activities of Daily Living (Please select one for each item) Drive Automobile Completely Able Take Medications Completely Able Use Telephone Completely Able Care for Appearance Completely Able Use Toilet Completely Able Bath / Shower Completely Able Dress Self Completely Able Feed Self Completely Able Walk Completely Able Get In / Out Bed Completely Able Housework Completely Able Prepare Meals Completely Able Handle Money Completely Able Shop for Self Completely Able Electronic Signature(s) Signed: 12/19/2020 10:25:07 AM By: Yevonne Pax RN Entered By: Yevonne Pax on 12/16/2020 10:25:05 Jeremiah Chapman ([id number]) -------------------------------------------------------------------------------- Education Screening Details Patient Name: Jeremiah Chapman. Date of Service: 12/16/2020 8:30 AM Medical Record Number: [id number] Patient Account Number: [id number] Date of Birth/Sex: 1946-03-30 (75 y.o. M) Treating RN: Yevonne Pax Primary Care Brave Dack: Dorothey Baseman Other Clinician: Referring Jacque Garrels: Dorothey Baseman Treating Rakeem Colley/Extender: Rowan Blase in Treatment: 0 Primary Learner Assessed: Patient Learning Preferences/Education Level/Primary Language Learning Preference: Explanation Highest Education Level: College or Above Preferred Language: English Cognitive Barrier Language Barrier: No Translator Needed: No Memory Deficit: No Emotional Barrier: No Cultural/Religious Beliefs Affecting Medical Care: No Physical Barrier Impaired Vision: No Impaired Hearing: No Decreased Hand dexterity: No Knowledge/Comprehension Knowledge Level: Medium Comprehension Level: High Ability to understand written instructions: High Ability to understand verbal instructions: High Motivation Anxiety Level: Anxious Cooperation: Cooperative Education Importance: Acknowledges Need Interest in Health Problems: Asks Questions Perception: Coherent Willingness to Engage in Self-Management High Activities: Readiness to Engage in Self-Management High Activities: Electronic Signature(s) Signed: 12/19/2020 10:25:07 AM By: Yevonne Pax RN Entered By: Yevonne Pax on 12/16/2020 10:25:41 Jeremiah Chapman ([id number]) -------------------------------------------------------------------------------- Fall Risk Assessment Details Patient Name: Jeremiah Chafe D. Date of Service: 12/16/2020 8:30 AM Medical Record Number: [id number] Patient Account Number: [id number] Date of Birth/Sex: 03/01/1946 (75 y.o. M) Treating RN:  Yevonne Pax Primary Care Rochelle Nephew: Dorothey Baseman Other Clinician: Referring Esmay Amspacher: Dorothey Baseman Treating Chidera Thivierge/Extender: Rowan Blase in Treatment: 0 Fall Risk Assessment Items Have you had 2 or more falls in the last 12 monthso 0 No Have you had any fall that resulted in injury in the last 12 monthso 0 No FALLS RISK SCREEN History of falling - immediate or within 3 months 0 No Secondary diagnosis (Do you have 2 or more medical diagnoseso) 0 No Ambulatory aid None/bed rest/wheelchair/nurse 0 No Crutches/cane/walker 0 No Furniture 0 No Intravenous therapy Access/Saline/Heparin Lock 0 No Gait/Transferring Normal/ bed rest/ wheelchair 0 No Weak (short steps with  or without shuffle, stooped but able to lift head while walking, may 0 No seek support from furniture) Impaired (short steps with shuffle, may have difficulty arising from chair, head down, impaired 0 No balance) Mental Status Oriented to own ability 0 No Electronic Signature(s) Signed: 12/19/2020 10:25:07 AM By: Yevonne Pax RN Entered By: Yevonne Pax on 12/16/2020 10:25:50 Jeremiah Chapman ([id number]) -------------------------------------------------------------------------------- Foot Assessment Details Patient Name: Jeremiah Chafe D. Date of Service: 12/16/2020 8:30 AM Medical Record Number: [id number] Patient Account Number: [id number] Date of Birth/Sex: 1946-11-08 (75 y.o. M) Treating RN: Yevonne Pax Primary Care Earon Rivest: Dorothey Baseman Other Clinician: Referring Rakeisha Nyce: Dorothey Baseman Treating Banessa Mao/Extender: Rowan Blase in Treatment: 0 Foot Assessment Items Site Locations + = Sensation present, - = Sensation absent, C = Callus, U = Ulcer R = Redness, W = Warmth, M = Maceration, PU = Pre-ulcerative lesion F = Fissure, S = Swelling, D = Dryness Assessment Right: Left: Other Deformity: No No Prior Foot Ulcer: No No Prior Amputation: No No Charcot Joint: No No Ambulatory  Status: Ambulatory Without Help Gait: Steady Electronic Signature(s) Signed: 12/19/2020 10:25:07 AM By: Yevonne Pax RN Entered By: Yevonne Pax on 12/16/2020 10:30:33 Jeremiah Chapman ([id number]) -------------------------------------------------------------------------------- Nutrition Risk Screening Details Patient Name: Jeremiah Chafe D. Date of Service: 12/16/2020 8:30 AM Medical Record Number: [id number] Patient Account Number: [id number] Date of Birth/Sex: 24-Sep-1946 (75 y.o. M) Treating RN: Yevonne Pax Primary Care Sirenity Shew: Dorothey Baseman Other Clinician: Referring Nasier Thumm: Dorothey Baseman Treating Alyssandra Hulsebus/Extender: Rowan Blase in Treatment: 0 Height (in): 69 Weight (lbs): 165 Body Mass Index (BMI): 24.4 Nutrition Risk Screening Items Score Screening NUTRITION RISK SCREEN: I have an illness or condition that made me change the kind and/or amount of food I eat 0 No I eat fewer than two meals per day 0 No I eat few fruits and vegetables, or milk products 0 No I have three or more drinks of beer, liquor or wine almost every day 0 No I have tooth or mouth problems that make it hard for me to eat 0 No I don't always have enough money to buy the food I need 0 No I eat alone most of the time 0 No I take three or more different prescribed or over-the-counter drugs a day 1 Yes Without wanting to, I have lost or gained 10 pounds in the last six months 0 No I am not always physically able to shop, cook and/or feed myself 0 No Nutrition Protocols Good Risk Protocol 0 No interventions needed Moderate Risk Protocol High Risk Proctocol Risk Level: Good Risk Score: 1 Electronic Signature(s) Signed: 12/19/2020 10:25:07 AM By: Yevonne Pax RN Entered By: Yevonne Pax on 12/16/2020 10:26:01

## 2020-12-19 NOTE — Progress Notes (Signed)
Jeremiah Chapman, Yossi D. ([id number]) Visit Report for 12/16/2020 Allergy List Details Patient Name: Jeremiah Chapman, Jeremiah D. Date of Service: 12/16/2020 8:30 AM Medical Record Number: [id number] Patient Account Number: [id number] Date of Birth/Sex: 09/29/1946 [age] y.o. M) Treating RN: Yevonne PaxEpps, Carrie Primary Care Shailynn Fong: [name], David Other Clinician: Referring Abdulrahman Bracey: [name], David Treating Bhakti Labella/Extender: Rowan BlaseStone, Hoyt Weeks in Treatment: 0 Allergies Active Allergies sitagliptin Type: Food Allergy Notes Electronic Signature(s) Signed: 12/19/2020 10:25:07 AM By: Yevonne PaxEpps, Carrie RN Entered By: Yevonne PaxEpps, Carrie on 12/16/2020 10:18:41 Jeremiah Chapman, Stephanie D. ([id number]) -------------------------------------------------------------------------------- Arrival Information Details Patient Name: Jeremiah Chapman, Jeremiah D. Date of Service: 12/16/2020 8:30 AM Medical Record Number: [id number] Patient Account Number: [id number] Date of Birth/Sex: 06/23/1946 [age] y.o. M) Treating RN: Yevonne PaxEpps, Carrie Primary Care Honest Safranek: [name], David Other Clinician: Referring Dusti Tetro: [name], David Treating Cedrik Heindl/Extender: Rowan BlaseStone, Hoyt Weeks in Treatment: 0 Visit Information Patient Arrived: Cloyde ReamsWalker Arrival Time: 10:14 Accompanied By: self Transfer Assistance: None Patient Identification Verified: Yes Secondary Verification Process Completed: Yes Patient Requires Transmission-Based No Precautions: Patient Has Alerts: Yes Patient Alerts: Patient on Blood Thinner ABI right 1.12 ABI left 1.11 Electronic Signature(s) Signed: 12/19/2020 10:25:07 AM By: Yevonne PaxEpps, Carrie RN Entered By: Yevonne PaxEpps, Carrie on 12/16/2020 10:29:38 Jeremiah Chapman, Jatin D. ([id number]) -------------------------------------------------------------------------------- Clinic Level of Care Assessment Details Patient Name: Jeremiah Chapman, Jeremiah D. Date of Service: 12/16/2020 8:30 AM Medical Record Number: [id number] Patient Account Number: [id number] Date of Birth/Sex:  01/06/1946 [age] y.o. M) Treating RN: Yevonne PaxEpps, Carrie Primary Care Edsel Shives: [name], David Other Clinician: Referring See Beharry: [name], David Treating Kaytlynne Neace/Extender: Rowan BlaseStone, Hoyt Weeks in Treatment: 0 Clinic Level of Care Assessment Items TOOL 2 Quantity Score X - Use when only an EandM is performed on the INITIAL visit 1 0 ASSESSMENTS - Nursing Assessment / Reassessment []  - General Physical Exam (combine w/ comprehensive assessment (listed just below) when performed on new 0 pt. evals) []  - 0 Comprehensive Assessment (HX, ROS, Risk Assessments, Wounds Hx, etc.) ASSESSMENTS - Wound and Skin Assessment / Reassessment X - Simple Wound Assessment / Reassessment - one wound 1 5 []  - 0 Complex Wound Assessment / Reassessment - multiple wounds []  - 0 Dermatologic / Skin Assessment (not related to wound area) ASSESSMENTS - Ostomy and/or Continence Assessment and Care []  - Incontinence Assessment and Management 0 []  - 0 Ostomy Care Assessment and Management (repouching, etc.) PROCESS - Coordination of Care X - Simple Patient / Family Education for ongoing care 1 15 []  - 0 Complex (extensive) Patient / Family Education for ongoing care X- 1 10 Staff obtains ChiropractorConsents, Records, Test Results / Process Orders []  - 0 Staff telephones HHA, Nursing Homes / Clarify orders / etc []  - 0 Routine Transfer to another Facility (non-emergent condition) []  - 0 Routine Hospital Admission (non-emergent condition) X- 1 15 New Admissions / Manufacturing engineernsurance Authorizations / Ordering NPWT, Apligraf, etc. []  - 0 Emergency Hospital Admission (emergent condition) X- 1 10 Simple Discharge Coordination []  - 0 Complex (extensive) Discharge Coordination PROCESS - Special Needs []  - Pediatric / Minor Patient Management 0 []  - 0 Isolation Patient Management []  - 0 Hearing / Language / Visual special needs []  - 0 Assessment of Community assistance (transportation, D/C planning, etc.) []  - 0 Additional  assistance / Altered mentation []  - 0 Support Surface(s) Assessment (bed, cushion, seat, etc.) INTERVENTIONS - Wound Cleansing / Measurement X - Wound Imaging (photographs - any number of wounds) 1 5 []  - 0 Wound Tracing (instead of photographs) X- 1 5 Simple Wound Measurement - one wound []  - 0 Complex Wound Measurement -  multiple wounds MATAEO, INGWERSEN ([id number]) X- 1 5 Simple Wound Cleansing - one wound []  - 0 Complex Wound Cleansing - multiple wounds INTERVENTIONS - Wound Dressings []  - Small Wound Dressing one or multiple wounds 0 X- 1 15 Medium Wound Dressing one or multiple wounds []  - 0 Large Wound Dressing one or multiple wounds []  - 0 Application of Medications - injection INTERVENTIONS - Miscellaneous []  - External ear exam 0 []  - 0 Specimen Collection (cultures, biopsies, blood, body fluids, etc.) []  - 0 Specimen(s) / Culture(s) sent or taken to Lab for analysis []  - 0 Patient Transfer (multiple staff / Lift / Similar devices) []  - 0 Simple Staple / Suture removal (25 or less) []  - 0 Complex Staple / Suture removal (26 or more) []  - 0 Hypo / Hyperglycemic Management (close monitor of Blood Glucose) X- 1 15 Ankle / Brachial Index (ABI) - do not check if billed separately Has the patient been seen at the hospital within the last three years: Yes Total Score: 100 Level Of Care: New/Established - Level 3 Electronic Signature(s) Signed: 12/19/2020 10:25:07 AM By: RN Entered By: on 12/16/2020 10:48:34 Pickert, ( ) -------------------------------------------------------------------------------- Lower Extremity Assessment Details Patient Name: D. Date of Service: 12/16/2020 8:30 AM Medical Record Number: Patient Account Number: Date of Birth/Sex: 1946-05-21 (75 y.o. M) Treating RN: Yevonne Pax Primary Care Bran Aldridge: Yevonne Pax Other Clinician: Referring Hancel Ion:  12/18/2020 Treating Demonie Kassa/Extender: Eliott Nine in Treatment: 0 Edema Assessment Assessed: [id number]: No] [Right: No] [Left: Edema] [Right: :] Calf Left: Right: Point of Measurement: 31 cm From Medial Instep 36 cm Ankle Left: Right: Point of Measurement: 13 cm From Medial Instep 22.2 cm Knee To Floor Left: Right: From Medial Instep 44 cm Vascular Assessment Pulses: Dorsalis Pedis Palpable: [Left:Yes] [Right:Yes] Electronic Signature(s) Signed: 12/19/2020 10:25:07 AM By: 12/18/2020 RN Entered By: [id number] on 12/16/2020 10:28:55 Duarte, 13/02/1946 ((73) -------------------------------------------------------------------------------- Multi Wound Chart Details Patient Name: Yevonne Pax D. Date of Service: 12/16/2020 8:30 AM Medical Record Number: Dorothey Baseman Patient Account Number: Rowan Blase Date of Birth/Sex: 07/22/1946 (75 y.o. M) Treating RN: Yevonne Pax Primary Care Shantele Reller: Yevonne Pax Other Clinician: Referring Tayjah Lobdell: 12/18/2020 Treating Aamiyah Derrick/Extender: Eliott Nine in Treatment: 0 Vital Signs Height(in): 69 Pulse(bpm): 68 Weight(lbs): 165 Blood Pressure(mmHg): 130/63 Body Mass Index(BMI): 24 Temperature(F): 98.5 Respiratory Rate(breaths/min): 22 Photos: [N/A:N/A] Wound Location: Left, Posterior Lower Leg N/A N/A Wounding Event: Blister N/A N/A Primary Etiology: Diabetic Wound/Ulcer of the Lower N/A N/A Extremity Comorbid History: Congestive Heart Failure, Coronary N/A N/A Artery Disease, Hypertension, Myocardial Infarction, Type II Diabetes, Neuropathy Date Acquired: 07/31/2020 N/A N/A Weeks of Treatment: 0 N/A N/A Wound Status: Open N/A N/A Measurements L x W x D (cm) 3x3.5x0.1 N/A N/A Area (cm) : 8.247 N/A N/A Volume (cm) : 0.825 N/A N/A Classification: Grade 2 N/A N/A Exudate Amount: Medium N/A N/A Exudate Type: Serosanguineous N/A N/A Exudate Color: red, brown N/A N/A Granulation Amount: Large  (67-100%) N/A N/A Granulation Quality: Pink N/A N/A Necrotic Amount: Small (1-33%) N/A N/A Exposed Structures: Fat Layer (Subcutaneous Tissue): N/A N/A Yes Fascia: No Tendon: No Muscle: No Joint: No Bone: No Epithelialization: None N/A N/A Treatment Notes Electronic Signature(s) Signed: 12/19/2020 10:25:07 AM By: 12/18/2020 RN Entered By: [id number] on 12/16/2020 10:43:16 13/02/1946 ((73) -------------------------------------------------------------------------------- Multi-Disciplinary Care Plan Details Patient Name: Yevonne Pax D. Date of Service: 12/16/2020 8:30 AM Medical Record Number: Dorothey Baseman Patient Account Number: Rowan Blase  Date of Birth/Sex: 10/26/1946 (75 y.o. M) Treating RN: Yevonne Pax Primary Care Ashya Nicolaisen: Dorothey Baseman Other Clinician: Referring Najae Filsaime: Dorothey Baseman Treating Chontel Warning/Extender: Rowan Blase in Treatment: 0 Active Inactive Nutrition Nursing Diagnoses: Potential for alteratiion in Nutrition/Potential for imbalanced nutrition Goals: Patient/caregiver verbalizes understanding of need to maintain therapeutic glucose control per primary care physician Date Initiated: 12/16/2020 Target Resolution Date: 01/16/2021 Goal Status: Active Interventions: Assess HgA1c results as ordered upon admission and as needed Assess patient nutrition upon admission and as needed per policy Notes: Wound/Skin Impairment Nursing Diagnoses: Knowledge deficit related to ulceration/compromised skin integrity Goals: Patient/caregiver will verbalize understanding of skin care regimen Date Initiated: 12/16/2020 Target Resolution Date: 01/16/2021 Goal Status: Active Ulcer/skin breakdown will have a volume reduction of 30% by week 4 Date Initiated: 12/16/2020 Target Resolution Date: 01/16/2021 Goal Status: Active Interventions: Assess patient/caregiver ability to obtain necessary supplies Assess patient/caregiver ability to perform  ulcer/skin care regimen upon admission and as needed Assess ulceration(s) every visit Notes: Electronic Signature(s) Signed: 12/19/2020 10:25:07 AM By: Yevonne Pax RN Entered By: Yevonne Pax on 12/16/2020 10:42:45 Pantaleo, Eliott Nine ([id number]) -------------------------------------------------------------------------------- Pain Assessment Details Patient Name: [name] Date of Service: 12/16/2020 8:30 AM Medical Record Number: [id number] Patient Account Number: [id number] Date of Birth/Sex: 01/18/1946 (75 y.o. M) Treating RN: Yevonne Pax Primary Care Glendell Fouse: Dorothey Baseman Other Clinician: Referring Starr Engel: Dorothey Baseman Treating Kourtnie Sachs/Extender: Rowan Blase in Treatment: 0 Active Problems Location of Pain Severity and Description of Pain Patient Has Paino Yes Site Locations With Dressing Change: Yes Rate the pain. Current Pain Level: 0 Worst Pain Level: 8 Least Pain Level: 0 Tolerable Pain Level: 5 Character of Pain Describe the Pain: Burning Pain Management and Medication Current Pain Management: Medication: Yes Cold Application: No Rest: Yes Massage: No Activity: No T.E.N.S.: No Heat Application: No Leg drop or elevation: No Is the Current Pain Management Adequate: Inadequate How does your wound impact your activities of daily livingo Sleep: Yes Bathing: No Appetite: No Relationship With Others: No Bladder Continence: No Emotions: No Bowel Continence: No Work: No Toileting: No Drive: No Dressing: No Hobbies: No Electronic Signature(s) Signed: 12/19/2020 10:25:07 AM By: Yevonne Pax RN Entered By: Yevonne Pax on 12/16/2020 10:16:24 Jeremiah Chapman ([id number]) -------------------------------------------------------------------------------- Patient/Caregiver Education Details Patient Name: [name] Date of Service: 12/16/2020 8:30 AM Medical Record Number: [id number] Patient Account Number: [id number] Date of  Birth/Gender: [birth year] (74 y.o. M) Treating RN: Yevonne Pax Primary Care Physician: Dorothey Baseman Other Clinician: Referring Physician: Dorothey Baseman Treating Physician/Extender: Rowan Blase in Treatment: 0 Education Assessment Education Provided To: Patient Education Topics Provided Wound/Skin Impairment: Methods: Explain/Verbal Responses: State content correctly Electronic Signature(s) Signed: 12/19/2020 10:25:07 AM By: Yevonne Pax RN Entered By: Yevonne Pax on 12/16/2020 10:48:53 Vasil, Eliott Nine ([id number]) -------------------------------------------------------------------------------- Wound Assessment Details Patient Name: Jeremiah Chapman. Date of Service: 12/16/2020 8:30 AM Medical Record Number: [id number] Patient Account Number: [id number] Date of Birth/Sex: Dec 05, 1945 (75 y.o. M) Treating RN: Yevonne Pax Primary Care Jisela Merlino: Dorothey Baseman Other Clinician: Referring Nateisha Moyd: Dorothey Baseman Treating Marquia Costello/Extender: Rowan Blase in Treatment: 0 Wound Status Wound Number: 1 Primary Diabetic Wound/Ulcer of the Lower Extremity Etiology: Wound Location: Left, Posterior Lower Leg Wound Open Wounding Event: Blister Status: Date Acquired: 07/31/2020 Comorbid Congestive Heart Failure, Coronary Artery Disease, Weeks Of Treatment: 0 History: Hypertension, Myocardial Infarction, Type II Diabetes, Clustered Wound: No Neuropathy Photos Wound Measurements Length: (cm) 3 Width: (cm) 3.5 Depth: (cm) 0.1 Area: (cm) 8.247 Volume: (cm) 0.825 % Reduction in Area: %  Reduction in Volume: Epithelialization: None Tunneling: No Undermining: No Wound Description Classification: Grade 2 Exudate Amount: Medium Exudate Type: Serosanguineous Exudate Color: red, brown Foul Odor After Cleansing: No Slough/Fibrino Yes Wound Bed Granulation Amount: Large (67-100%) Exposed Structure Granulation Quality: Pink Fascia Exposed: No Necrotic Amount:  Small (1-33%) Fat Layer (Subcutaneous Tissue) Exposed: Yes Necrotic Quality: Adherent Slough Tendon Exposed: No Muscle Exposed: No Joint Exposed: No Bone Exposed: No Electronic Signature(s) Signed: 12/19/2020 10:25:07 AM By: Yevonne Pax RN Entered By: Yevonne Pax on 12/16/2020 10:32:20 Jeremiah Chapman ([id number]) -------------------------------------------------------------------------------- Vitals Details Patient Name: [name] Date of Service: 12/16/2020 8:30 AM Medical Record Number: [id number] Patient Account Number: [id number] Date of Birth/Sex: 04-Aug-1946 (75 y.o. M) Treating RN: Yevonne Pax Primary Care Mats Jeanlouis: Dorothey Baseman Other Clinician: Referring Dai Apel: Dorothey Baseman Treating Daizha Anand/Extender: Rowan Blase in Treatment: 0 Vital Signs Time Taken: 10:16 Temperature (F): 98.5 Height (in): 69 Pulse (bpm): 68 Source: Stated Respiratory Rate (breaths/min): 22 Weight (lbs): 165 Blood Pressure (mmHg): 130/63 Source: Stated Reference Range: 80 - 120 mg / dl Body Mass Index (BMI): 24.4 Electronic Signature(s) Signed: 12/19/2020 10:25:07 AM By: Yevonne Pax RN Entered By: Yevonne Pax on 12/16/2020 10:17:57

## 2020-12-22 ENCOUNTER — Encounter: Payer: Medicare Other | Admitting: Physician Assistant

## 2020-12-22 ENCOUNTER — Other Ambulatory Visit: Payer: Self-pay

## 2020-12-22 DIAGNOSIS — E11622 Type 2 diabetes mellitus with other skin ulcer: Secondary | ICD-10-CM | POA: Diagnosis not present

## 2020-12-22 NOTE — Progress Notes (Addendum)
TYMIR, TERRAL ([id number]) Visit Report for 12/22/2020 Chief Complaint Document Details Patient Name: Jeremiah Chapman, Jeremiah Chapman. Date of Service: 12/22/2020 11:00 AM Medical Record Number: [id number] Patient Account Number: [id number] Date of Birth/Sex: [birth year] (75 y.o. M) Treating RN: Huel Coventry Primary Care Provider: Dorothey Baseman Other Clinician: Referring Provider: Dorothey Baseman Treating Provider/Extender: Rowan Blase in Treatment: 0 Information Obtained from: Patient Chief Complaint Left LE Ulcer Electronic Signature(s) Signed: 12/22/2020 10:50:34 AM By: Lenda Kelp PA-C Entered By: Lenda Kelp on 12/22/2020 10:50:33 Jeremiah Chapman ([id number]) -------------------------------------------------------------------------------- HPI Details Patient Name: Jeremiah Chapman. Date of Service: 12/22/2020 11:00 AM Medical Record Number: [id number] Patient Account Number: [id number] Date of Birth/Sex: 12-24-1945 (75 y.o. M) Treating RN: Huel Coventry Primary Care Provider: Dorothey Baseman Other Clinician: Referring Provider: Dorothey Baseman Treating Provider/Extender: Rowan Blase in Treatment: 0 History of Present Illness HPI Description: 12/16/2020 patient presents for initial evaluation here in the clinic concerning an issue but has been having with wounds over the left posterior lower leg. The good news is this does not appear to be doing too poorly in my opinion. In fact it appeared to be very clean with no slough buildup. He did have some dry skin around the edges I was able to mechanically debride this way no sharp debridement was necessary today. With that being said he does have an extensive medical history in general and he tells me that his podiatrist, was considering the possibility of taking him to the OR to apply ACell to the wound. With that being said however the patient has not been cleared to be able to go to the ER by his primary care provider in fact  he stated he did not want to sign off on it unless the patient was able to get clearance from his pulmonologist and cardiologist. Again the patient does have an extensive past medical history which includes diabetes mellitus type 2, chronic venous insufficiency, congestive heart failure, hypertension, and chronic kidney disease stage III. He is also on oxygen currently. All of which combines into a scenario where it is really not ideal for him to go in for an elective surgery of any kind to be honest. Subsequently the patient also really does not want to undergo any type of OR procedure at this point if he can avoid it. For that reason I think we can definitely be of benefit as far as trying to help the patient with getting this wound to heal. I think that with appropriate compression and wound care we may not even require any skin substitutes. Nonetheless I explained to the patient that if we need to use a skin substitute we have some things we can utilize in office without having to go to the OR. The patient is currently on doxycycline. 12/22/2020 upon evaluation today patient's wound actually appears to be doing better. Is measuring a little smaller today and looks clean. There is no significant slough buildup at this time is not having any significant pain overall I am extremely pleased with where things stand today Electronic Signature(s) Signed: 12/22/2020 1:55:59 PM By: Lenda Kelp PA-C Entered By: Lenda Kelp on 12/22/2020 13:55:59 Poullard, Jeremiah Chapman ([id number]) -------------------------------------------------------------------------------- Physical Exam Details Patient Name: Jeremiah Chafe D. Date of Service: 12/22/2020 11:00 AM Medical Record Number: [id number] Patient Account Number: [id number] Date of Birth/Sex: [birth year] (75 y.o. M) Treating RN: Huel Coventry Primary Care Provider: Dorothey Baseman Other Clinician: Referring Provider: Dorothey Baseman Treating  Provider/Extender: Rowan Blase in  Treatment: 0 Constitutional Well-nourished and well-hydrated in no acute distress. Respiratory normal breathing without difficulty. Psychiatric this patient is able to make decisions and demonstrates good insight into disease process. Alert and Oriented x 3. pleasant and cooperative. Notes Patient's wound bed actually showed signs of good granulation epithelization I do feel like he is making great progress at this point and in general I believe that we will get a be able to avoid having to go for any type of surgery as far as application of a skin substitute is concerned. Obviously if we needed to do a set skin substitute I think that an Apligraf would be an appropriate way to go and we could do that here in the office without having to go through the OR route. Electronic Signature(s) Signed: 12/22/2020 1:56:31 PM By: Lenda KelpStone III, Lisa Milian PA-C Entered By: Lenda KelpStone III, Kanin Lia on 12/22/2020 13:56:30 Jeremiah Chapman, Jeremiah D. ([id number]) -------------------------------------------------------------------------------- Physician Orders Details Patient Name: Jeremiah Chapman, Jeremiah D. Date of Service: 12/22/2020 11:00 AM Medical Record Number: [id number] Patient Account Number: [id number] Date of Birth/Sex: 12/01/1945 [age] y.o. M) Treating RN: Rogers BlockerSanchez, Kenia Primary Care Provider: [name], David Other Clinician: Referring Provider: [name], David Treating Provider/Extender: Rowan BlaseStone, Chavez Rosol Weeks in Treatment: 0 Verbal / Phone Orders: No Diagnosis Coding ICD-10 Coding Code Description E11.622 Type 2 diabetes mellitus with other skin ulcer I87.2 Venous insufficiency (chronic) (peripheral) L97.822 Non-pressure chronic ulcer of other part of left lower leg with fat layer exposed I50.42 Chronic combined systolic (congestive) and diastolic (congestive) heart failure I10 Essential (primary) hypertension N18.30 Chronic kidney disease, stage 3 unspecified Follow-up  Appointments o Return Appointment in 1 week. o Nurse Visit as needed Bathing/ Shower/ Hygiene o May shower with wound dressing protected with water repellent cover or cast protector. Edema Control - Lymphedema / Segmental Compressive Device / Other o Elevate, Exercise Daily and Avoid Standing for Long Periods of Time. o Elevate legs to the level of the heart and pump ankles as often as possible o Elevate leg(s) parallel to the floor when sitting. o DO YOUR BEST to sleep in the bed at night. DO NOT sleep in your recliner. Long hours of sitting in a recliner leads to swelling of the legs and/or potential wounds on your backside. Wound Treatment Wound #1 - Lower Leg Wound Laterality: Left, Posterior Cleanser: Soap and Water 1 x Per Week/30 Days Discharge Instructions: Gently cleanse wound with antibacterial soap, rinse and pat dry prior to dressing wounds Primary Dressing: Prisma 4.34 (in) (Generic) 1 x Per Week/30 Days Discharge Instructions: Moisten w/normal saline or sterile water; Cover wound as directed. Do not remove from wound bed. Secondary Dressing: Gauze 1 x Per Week/30 Days Discharge Instructions: As directed: dry, moistened with saline or moistened with Dakins Solution Compression Wrap: Profore Lite LF 3 Multilayer Compression Bandaging System 1 x Per Week/30 Days Discharge Instructions: Apply 3 multi-layer wrap as prescribed. Electronic Signature(s) Signed: 12/22/2020 4:33:18 PM By: Phillis HaggisSanchez Pereyda, Dondra PraderKenia RN Signed: 12/22/2020 5:09:34 PM By: Lenda KelpStone III, Lanaiya Lantry PA-C Entered By: Phillis HaggisSanchez Pereyda, Dondra PraderKenia on 12/22/2020 11:55:16 Jeremiah Chapman, Jeremiah D. ([id number]) -------------------------------------------------------------------------------- Problem List Details Patient Name: Jeremiah Chapman, Jeremiah D. Date of Service: 12/22/2020 11:00 AM Medical Record Number: [id number] Patient Account Number: [id number] Date of Birth/Sex: 11/13/1946 [age] y.o. M) Treating RN: Huel CoventryWoody, Kim Primary Care  Provider: [name], David Other Clinician: Referring Provider: [name], David Treating Provider/Extender: Rowan BlaseStone, Jahnay Lantier Weeks in Treatment: 0 Active Problems ICD-10 Encounter Code Description Active Date MDM Diagnosis E11.622 Type 2 diabetes mellitus with other skin ulcer 12/16/2020 No Yes  I87.2 Venous insufficiency (chronic) (peripheral) 12/16/2020 No Yes L97.822 Non-pressure chronic ulcer of other part of left lower leg with fat layer 12/16/2020 No Yes exposed I50.42 Chronic combined systolic (congestive) and diastolic (congestive) heart 12/16/2020 No Yes failure I10 Essential (primary) hypertension 12/16/2020 No Yes N18.30 Chronic kidney disease, stage 3 unspecified 12/16/2020 No Yes Inactive Problems Resolved Problems Electronic Signature(s) Signed: 12/22/2020 10:50:27 AM By: Lenda KelpStone III, Gwynevere Lizana PA-C Entered By: Lenda KelpStone III, Pranika Finks on 12/22/2020 10:50:26 Uemura, Jeremiah NineKENNETH D. ([id number]) -------------------------------------------------------------------------------- Progress Note Details Patient Name: Jeremiah Chapman, Jeremiah D. Date of Service: 12/22/2020 11:00 AM Medical Record Number: [id number] Patient Account Number: [id number] Date of Birth/Sex: 03/15/1946 [age] y.o. M) Treating RN: Huel CoventryWoody, Kim Primary Care Provider: [name], David Other Clinician: Referring Provider: [name], David Treating Provider/Extender: Rowan BlaseStone, Floretta Petro Weeks in Treatment: 0 Subjective Chief Complaint Information obtained from Patient Left LE Ulcer History of Present Illness (HPI) 12/16/2020 patient presents for initial evaluation here in the clinic concerning an issue but has been having with wounds over the left posterior lower leg. The good news is this does not appear to be doing too poorly in my opinion. In fact it appeared to be very clean with no slough buildup. He did have some dry skin around the edges I was able to mechanically debride this way no sharp debridement was necessary today. With that being said he does  have an extensive medical history in general and he tells me that his podiatrist, was considering the possibility of taking him to the OR to apply ACell to the wound. With that being said however the patient has not been cleared to be able to go to the ER by his primary care provider in fact he stated he did not want to sign off on it unless the patient was able to get clearance from his pulmonologist and cardiologist. Again the patient does have an extensive past medical history which includes diabetes mellitus type 2, chronic venous insufficiency, congestive heart failure, hypertension, and chronic kidney disease stage III. He is also on oxygen currently. All of which combines into a scenario where it is really not ideal for him to go in for an elective surgery of any kind to be honest. Subsequently the patient also really does not want to undergo any type of OR procedure at this point if he can avoid it. For that reason I think we can definitely be of benefit as far as trying to help the patient with getting this wound to heal. I think that with appropriate compression and wound care we may not even require any skin substitutes. Nonetheless I explained to the patient that if we need to use a skin substitute we have some things we can utilize in office without having to go to the OR. The patient is currently on doxycycline. 12/22/2020 upon evaluation today patient's wound actually appears to be doing better. Is measuring a little smaller today and looks clean. There is no significant slough buildup at this time is not having any significant pain overall I am extremely pleased with where things stand today Objective Constitutional Well-nourished and well-hydrated in no acute distress. Vitals Time Taken: 11:23 AM, Height: 69 in, Weight: 165 lbs, BMI: 24.4, Temperature: 98.1 F, Pulse: 65 bpm, Respiratory Rate: 22 breaths/min, Blood Pressure: 110/52 mmHg. Respiratory normal breathing without  difficulty. Psychiatric this patient is able to make decisions and demonstrates good insight into disease process. Alert and Oriented x 3. pleasant and cooperative. General Notes: Patient's wound bed actually showed signs of good  granulation epithelization I do feel like he is making great progress at this point and in general I believe that we will get a be able to avoid having to go for any type of surgery as far as application of a skin substitute is concerned. Obviously if we needed to do a set skin substitute I think that an Apligraf would be an appropriate way to go and we could do that here in the office without having to go through the OR route. Integumentary (Hair, Skin) Wound #1 status is Open. Original cause of wound was Blister. The wound is located on the Left,Posterior Lower Leg. The wound measures 2.6cm length x 2.4cm width x 0.1cm depth; 4.901cm^2 area and 0.49cm^3 volume. There is Fat Layer (Subcutaneous Tissue) exposed. There is no tunneling or undermining noted. There is a medium amount of serosanguineous drainage noted. There is large (67-100%) pink granulation within the wound bed. There is a small (1-33%) amount of necrotic tissue within the wound bed including Adherent Slough. 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 There was a Three Layer Compression Therapy Procedure with a pre-treatment ABI of 1.1 by Rogers Blocker, RN. Post procedure Diagnosis Wound #1: Same as  Pre-Procedure Plan Follow-up Appointments: Return Appointment in 1 week. Nurse Visit as needed Bathing/ Shower/ Hygiene: May shower with wound dressing protected with water repellent cover or cast protector. Edema Control - Lymphedema / Segmental Compressive Device / Other: Elevate, Exercise Daily and Avoid Standing for Long Periods of Time. Elevate legs to the level of the heart and pump ankles as often as possible Elevate leg(s) parallel to the floor when sitting. DO YOUR BEST to sleep in the bed at night. DO NOT sleep in your recliner. Long hours of sitting in a recliner leads to swelling of the legs and/or potential wounds on your backside. WOUND #1: - Lower Leg Wound Laterality: Left, Posterior Cleanser: Soap and Water 1 x Per Week/30 Days Discharge Instructions: Gently cleanse wound with antibacterial soap, rinse and pat dry prior to dressing wounds Primary Dressing: Prisma 4.34 (in) (Generic) 1 x Per Week/30 Days Discharge Instructions: Moisten w/normal saline or sterile water; Cover wound as directed. Do not remove from wound bed. Secondary Dressing: Gauze 1 x Per Week/30 Days Discharge Instructions: As directed: dry, moistened with saline or moistened with Dakins Solution Compression Wrap: Profore Lite LF 3 Multilayer Compression Bandaging System 1 x Per Week/30 Days Discharge Instructions: Apply 3 multi-layer wrap as prescribed. 1 would recommend currently that we have the patient go ahead and continue with the wound care measures as before with regard to the silver collagen which I feel like has been beneficial. 2. We also continue with 3 layer compression wrap which is keeping edema under good control. We will see patient back for reevaluation in 1 week here in the clinic. If anything worsens or changes patient will contact our office for additional recommendations. Electronic Signature(s) Signed: 12/22/2020 1:56:55 PM By: Lenda Kelp PA-C Entered By: Lenda Kelp on  12/22/2020 13:56:54 Jeremiah Chapman, Jeremiah Chapman ([id number]) -------------------------------------------------------------------------------- SuperBill Details Patient Name: Jeremiah Chapman. Date of Service:  12/22/2020 Medical Record Number: [id number] Patient Account Number: [id number] Date of Birth/Sex: 11/09/1946 (75 y.o. M) Treating RN: Rogers Blocker Primary Care Provider: Dorothey Baseman Other Clinician: Referring Provider: Dorothey Baseman Treating Provider/Extender: Rowan Blase in Treatment: 0 Diagnosis Coding ICD-10 Codes Code Description [phone] Type 2 diabetes mellitus with other skin ulcer I87.2 Venous insufficiency (chronic) (peripheral) L97.822 Non-pressure chronic ulcer of other part of left lower leg with fat layer exposed I50.42 Chronic combined systolic (congestive) and diastolic (congestive) heart failure I10 Essential (primary) hypertension N18.30 Chronic kidney disease, stage 3 unspecified Facility Procedures CPT4 Code: [id number] Description: (Facility Use Only) [phone] - APPLY MULTLAY COMPRS LWR LT LEG Modifier: Quantity: 1 Physician Procedures CPT4 Code: [id number] Description: 99213 - WC PHYS LEVEL 3 - EST PT Modifier: Quantity: 1 CPT4 Code: Description: ICD-10 Diagnosis Description E11.622 Type 2 diabetes mellitus with other skin ulcer I87.2 Venous insufficiency (chronic) (peripheral) L97.822 Non-pressure chronic ulcer of other part of left lower leg with fat lay I50.42 Chronic combined  systolic (congestive) and diastolic (congestive) heart Modifier: er exposed failure Quantity: Electronic Signature(s) Signed: 12/22/2020 1:57:55 PM By: Lenda Kelp PA-C Entered By: Lenda Kelp on 12/22/2020 13:57:50

## 2020-12-22 NOTE — Progress Notes (Addendum)
Jeremiah Chapman, Jeremiah Chapman ([id number]) Visit Report for 12/22/2020 Arrival Information Details Patient Name: Jeremiah Chapman, Jeremiah Chapman. Date of Service: 12/22/2020 11:00 AM Medical Record Number: [id number] Patient Account Number: [id number] Date of Birth/Sex: [birth year] (75 y.o. M) Treating RN: Rogers Blocker Primary Care Ilma Achee: Dorothey Baseman Other Clinician: Referring Yiselle Babich: Dorothey Baseman Treating Florenda Watt/Extender: Rowan Blase in Treatment: 0 Visit Information History Since Last Visit Pain Present Now: No Patient Arrived: Walker Arrival Time: 11:22 Accompanied By: self Transfer Assistance: None Patient Identification Verified: Yes Secondary Verification Process Completed: Yes Patient Requires Transmission-Based No Precautions: Patient Has Alerts: Yes Patient Alerts: Patient on Blood Thinner ABI right 1.12 ABI left 1.11 Electronic Signature(s) Signed: 12/22/2020 4:33:18 PM By: Phillis Haggis, Dondra Prader RN Entered By: Phillis Haggis, Dondra Prader on 12/22/2020 11:23:26 Jeremiah Chapman ([id number]) -------------------------------------------------------------------------------- Compression Therapy Details Patient Name: Jeremiah Chafe D. Date of Service: 12/22/2020 11:00 AM Medical Record Number: [id number] Patient Account Number: [id number] Date of Birth/Sex: December 29, 1945 (75 y.o. M) Treating RN: Rogers Blocker Primary Care Anzlee Hinesley: Dorothey Baseman Other Clinician: Referring Tylasia Fletchall: Dorothey Baseman Treating Mendy Lapinsky/Extender: Rowan Blase in Treatment: 0 Compression Therapy Performed for Wound Assessment: Wound #1 Left,Posterior Lower Leg Performed By: Ovidio Hanger, RN Compression Type: Three Layer Pre Treatment ABI: 1.1 Post Procedure Diagnosis Same as Pre-procedure Electronic Signature(s) Signed: 12/22/2020 4:33:18 PM By: Phillis Haggis, Dondra Prader RN Entered By: Phillis Haggis, Kenia on 12/22/2020 11:54:02 Jeremiah Chapman, Jeremiah Chapman  ([id number]) -------------------------------------------------------------------------------- Encounter Discharge Information Details Patient Name: Jeremiah Chapman. Date of Service: 12/22/2020 11:00 AM Medical Record Number: [id number] Patient Account Number: [id number] Date of Birth/Sex: 07-21-1946 (75 y.o. M) Treating RN: Rogers Blocker Primary Care Arneta Mahmood: Dorothey Baseman Other Clinician: Referring Kia Varnadore: Dorothey Baseman Treating Tiffiny Worthy/Extender: Rowan Blase in Treatment: 0 Encounter Discharge Information Items Discharge Condition: Stable Ambulatory Status: Walker Discharge Destination: Home Transportation: Private Auto Accompanied By: self Schedule Follow-up Appointment: Yes Clinical Summary of Care: Electronic Signature(s) Signed: 12/22/2020 4:33:18 PM By: Phillis Haggis, Dondra Prader RN Entered By: Phillis Haggis, Dondra Prader on 12/22/2020 12:09:51 Jeremiah Chapman ([id number]) -------------------------------------------------------------------------------- Lower Extremity Assessment Details Patient Name: Jeremiah Chapman. Date of Service: 12/22/2020 11:00 AM Medical Record Number: [id number] Patient Account Number: [id number] Date of Birth/Sex: 12-23-1945 (75 y.o. M) Treating RN: Rogers Blocker Primary Care Hiliana Eilts: Dorothey Baseman Other Clinician: Referring Caillou Minus: Dorothey Baseman Treating Orva Gwaltney/Extender: Rowan Blase in Treatment: 0 Edema Assessment Assessed: Kyra Searles: Yes] [Right: No] Edema: [Left: N] [Right: o] Calf Left: Right: Point of Measurement: 31 cm From Medial Instep 34 cm Ankle Left: Right: Point of Measurement: 13 cm From Medial Instep 21.4 cm Vascular Assessment Pulses: Dorsalis Pedis Palpable: [Left:Yes] Electronic Signature(s) Signed: 12/22/2020 4:33:18 PM By: Phillis Haggis, Dondra Prader RN Entered By: Phillis Haggis, Dondra Prader on 12/22/2020 11:40:10 Jeremiah Chapman  ([id number]) -------------------------------------------------------------------------------- Multi Wound Chart Details Patient Name: Jeremiah Chafe D. Date of Service: 12/22/2020 11:00 AM Medical Record Number: [id number] Patient Account Number: [id number] Date of Birth/Sex: 1946/11/01 (75 y.o. M) Treating RN: Rogers Blocker Primary Care Gideon Burstein: Dorothey Baseman Other Clinician: Referring Labrenda Lasky: Dorothey Baseman Treating Samaia Iwata/Extender: Rowan Blase in Treatment: 0 Vital Signs Height(in): 69 Pulse(bpm): 65 Weight(lbs): 165 Blood Pressure(mmHg): 110/52 Body Mass Index(BMI): 24 Temperature(F): 98.1 Respiratory Rate(breaths/min): 22 Photos: [1:No Photos] [N/A:N/A] Wound Location: [1:Left, Posterior Lower Leg] [N/A:N/A] Wounding Event: [1:Blister] [N/A:N/A] Primary Etiology: [1:Diabetic Wound/Ulcer of the Lower Extremity] [N/A:N/A] Comorbid History: [1:Congestive Heart Failure, Coronary Artery Disease, Hypertension, Myocardial Infarction, Type II Diabetes, Neuropathy] [N/A:N/A] Date Acquired: [1:07/31/2020] [N/A:N/A] Weeks of Treatment: [1:0] [N/A:N/A] Wound Status: [1:Open] [N/A:N/A] Measurements L x  W x D (cm) [1:2.6x2.4x0.1] [N/A:N/A] Area (cm) : [1:4.901] [N/A:N/A] Volume (cm) : [1:0.49] [N/A:N/A] % Reduction in Area: [1:40.60%] [N/A:N/A] % Reduction in Volume: [1:40.60%] [N/A:N/A] Classification: [1:Grade 2] [N/A:N/A] Exudate Amount: [1:Medium] [N/A:N/A] Exudate Type: [1:Serosanguineous] [N/A:N/A] Exudate Color: [1:red, brown] [N/A:N/A] Granulation Amount: [1:Large (67-100%)] [N/A:N/A] Granulation Quality: [1:Pink] [N/A:N/A] Necrotic Amount: [1:Small (1-33%)] [N/A:N/A] Exposed Structures: [1:Fat Layer (Subcutaneous Tissue): Yes Fascia: No Tendon: No Muscle: No Joint: No Bone: No None] [N/A:N/A N/A] Treatment Notes Electronic Signature(s) Signed: 12/22/2020 4:33:18 PM By: Phillis Haggis, Dondra Prader RN Entered By: Phillis Haggis, Dondra Prader on 12/22/2020  11:53:40 Jeremiah Chapman ([id number]) -------------------------------------------------------------------------------- Multi-Disciplinary Care Plan Details Patient Name: Jeremiah Chapman. Date of Service: 12/22/2020 11:00 AM Medical Record Number: [id number] Patient Account Number: [id number] Date of Birth/Sex: [birth year] (75 y.o. M) Treating RN: Rogers Blocker Primary Care Drexler Maland: Dorothey Baseman Other Clinician: Referring Iwalani Templeton: Dorothey Baseman Treating Marguita Venning/Extender: Rowan Blase in Treatment: 0 Active Inactive Electronic Signature(s) Signed: 01/01/2021 8:14:50 AM By: Elliot Gurney, BSN, RN, CWS, Kim RN, BSN Signed: 03/06/2021 11:57:16 AM By: Lajean Manes RN Previous Signature: 12/22/2020 4:33:18 PM Version By: Phillis Haggis, Dondra Prader RN Entered By: Elliot Gurney, BSN, RN, CWS, Kim on 01/01/2021 08:14:50 Jeremiah Chapman ([id number]) -------------------------------------------------------------------------------- Pain Assessment Details Patient Name: Jeremiah Chapman. Date of Service: 12/22/2020 11:00 AM Medical Record Number: [id number] Patient Account Number: [id number] Date of Birth/Sex: 03/14/1946 (75 y.o. M) Treating RN: Rogers Blocker Primary Care Ameshia Pewitt: Dorothey Baseman Other Clinician: Referring Alita Waldren: Dorothey Baseman Treating Leavy Heatherly/Extender: Rowan Blase in Treatment: 0 Active Problems Location of Pain Severity and Description of Pain Patient Has Paino No Site Locations Rate the pain. Current Pain Level: 0 Pain Management and Medication Current Pain Management: Electronic Signature(s) Signed: 12/22/2020 4:33:18 PM By: Phillis Haggis, Dondra Prader RN Entered By: Phillis Haggis, Kenia on 12/22/2020 11:28:46 Jeremiah Chapman ([id number]) -------------------------------------------------------------------------------- Patient/Caregiver Education Details Patient Name: Jeremiah Chapman. Date of Service: 12/22/2020 11:00 AM Medical Record Number:  [id number] Patient Account Number: [id number] Date of Birth/Gender: 27-Sep-1946 (74 y.o. M) Treating RN: Rogers Blocker Primary Care Physician: Dorothey Baseman Other Clinician: Referring Physician: Dorothey Baseman Treating Physician/Extender: Rowan Blase in Treatment: 0 Education Assessment Education Provided To: Patient Education Topics Provided Wound/Skin Impairment: Methods: Explain/Verbal Responses: State content correctly Electronic Signature(s) Signed: 12/22/2020 4:33:18 PM By: Phillis Haggis, Dondra Prader RN Entered By: Phillis Haggis, Dondra Prader on 12/22/2020 12:09:12 Jeremiah Chapman ([id number]) -------------------------------------------------------------------------------- Wound Assessment Details Patient Name: Jeremiah Chapman. Date of Service: 12/22/2020 11:00 AM Medical Record Number: [id number] Patient Account Number: [id number] Date of Birth/Sex: 07/28/1946 (75 y.o. M) Treating RN: Rogers Blocker Primary Care Kambree Krauss: Dorothey Baseman Other Clinician: Referring Matraca Hunkins: Dorothey Baseman Treating Cylas Falzone/Extender: Rowan Blase in Treatment: 0 Wound Status Wound Number: 1 Primary Diabetic Wound/Ulcer of the Lower Extremity Etiology: Wound Location: Left, Posterior Lower Leg Wound Open Wounding Event: Blister Status: Date Acquired: 07/31/2020 Comorbid Congestive Heart Failure, Coronary Artery Disease, Weeks Of Treatment: 0 History: Hypertension, Myocardial Infarction, Type II Diabetes, Clustered Wound: No Neuropathy Photos Photo Uploaded By: Phillis Haggis, Dondra Prader on 12/22/2020 12:41:05 Wound Measurements Length: (cm) 2.6 Width: (cm) 2.4 Depth: (cm) 0.1 Area: (cm) 4.901 Volume: (cm) 0.49 % Reduction in Area: 40.6% % Reduction in Volume: 40.6% Epithelialization: None Tunneling: No Undermining: No Wound Description Classification: Grade 2 Exudate Amount: Medium Exudate Type: Serosanguineous Exudate Color: red, brown Foul Odor After Cleansing:  No Slough/Fibrino Yes Wound Bed Granulation Amount: Large (67-100%) Exposed Structure Granulation Quality: Pink Fascia Exposed: No Necrotic Amount: Small (1-33%) Fat Layer (Subcutaneous Tissue)  Exposed: Yes Necrotic Quality: Adherent Slough Tendon Exposed: No Muscle Exposed: No Joint Exposed: No Bone Exposed: No Electronic Signature(s) Signed: 12/22/2020 4:33:18 PM By: Phillis Haggis, Dondra Prader RN Entered By: Phillis Haggis, Kenia on 12/22/2020 11:38:34 Jeremiah Chapman ([id number]) -------------------------------------------------------------------------------- Vitals Details Patient Name: Jeremiah Chapman. Date of Service: 12/22/2020 11:00 AM Medical Record Number: [id number] Patient Account Number: [id number] Date of Birth/Sex: May 22, 1946 (75 y.o. M) Treating RN: Rogers Blocker Primary Care [place] Lapaglia: Dorothey Baseman Other Clinician: Referring Les Longmore: Dorothey Baseman Treating Alontae Chaloux/Extender: Rowan Blase in Treatment: 0 Vital Signs Time Taken: 11:23 Temperature (F): 98.1 Height (in): 69 Pulse (bpm): 65 Weight (lbs): 165 Respiratory Rate (breaths/min): 22 Body Mass Index (BMI): 24.4 Blood Pressure (mmHg): 110/52 Reference Range: 80 - 120 mg / dl Electronic Signature(s) Signed: 12/22/2020 4:33:18 PM By: Phillis Haggis, Dondra Prader RN Entered By: Phillis Haggis, Dondra Prader on 12/22/2020 11:28:31

## 2020-12-25 ENCOUNTER — Encounter: Payer: Medicare Other | Admitting: Podiatry

## 2020-12-30 ENCOUNTER — Ambulatory Visit: Payer: Medicare Other | Admitting: Physician Assistant

## 2020-12-30 DEATH — deceased

## 2021-01-08 ENCOUNTER — Encounter: Payer: Medicare Other | Admitting: Podiatry

## 2021-01-26 ENCOUNTER — Ambulatory Visit: Payer: Medicare Other | Admitting: Podiatry

## 2021-02-02 NOTE — Progress Notes (Deleted)
Patient ID: Jeremiah Chapman, male    DOB: 1946-10-19, 75 y.o.   MRN: [id number]  HPI  Jeremiah Chapman is a 75 y/o male with a history of CAD, DM, hyperlipidemia, HTN, atrial fibrillation (chronic), pleural effusion, gout, previous tobacco use and chronic heart failure.   Echo report from 06/03/20 reviewed and showed an EF of 30-35% along with moderate LVH and moderately elevated PA pressure.   Catheterization done 10/30/2019 and showed:  Ost LAD to Prox LAD lesion is 100% stenosed.  Dist LAD lesion is 50% stenosed.  Prox Cx to Mid Cx lesion is 30% stenosed.  Previously placed Mid Cx drug eluting stent is widely patent.  Balloon angioplasty was performed.  Ost 1st Mrg to 1st Mrg lesion is 90% stenosed.  Ost 2nd Mrg to 2nd Mrg lesion is 100% stenosed.  Prox RCA to Mid RCA lesion is 50% stenosed.  SVG and is moderate in size.  LIMA and is moderate in size.  3rd Mrg-1 lesion is 90% stenosed.  3rd Mrg-2 lesion is 80% stenosed.  Has not been admitted or been in the ED in the last 6 months.   He presents today for a follow-up visit with a chief complaint of   Past Medical History:  Diagnosis Date  . Atrial fibrillation (HCC)   . CAD (coronary artery disease)   . CHF (congestive heart failure) (HCC)   . Diabetes mellitus without complication (HCC)   . Gout   . Hypercholesterolemia   . Hypertension   . Pleural effusion    Past Surgical History:  Procedure Laterality Date  . CARDIAC CATHETERIZATION    . CARDIOVERSION N/A 01/20/2017   Procedure: CARDIOVERSION;  Surgeon: Marcina Millard, MD;  Location: [facility] ORS;  Service: Cardiovascular;  Laterality: N/A;  . COLONOSCOPY    . CORONARY ARTERY BYPASS GRAFT    . CORONARY STENT INTERVENTION N/A 03/28/2017   Procedure: Coronary Stent Intervention;  Surgeon: Marcina Millard, MD;  Location: [facility] INVASIVE CV [facility];  Service: Cardiovascular;  Laterality: N/A;  . LEFT HEART CATH AND CORONARY ANGIOGRAPHY N/A 03/28/2017   Procedure:  Left Heart Cath and Coronary Angiography;  Surgeon: Marcina Millard, MD;  Location: [facility] INV[facility]IVE CV [facility];  Service: Cardiovascular;  Laterality: N/A;  . LEFT HEART CATH AND CORS/GRAFTS ANGIOGRAPHY N/A 10/30/2019   Procedure: LEFT HEART CATH AND CORS/GRAFTS ANGIOGRAPHY;  Surgeon: Lamar Blinks, MD;  Location: [facility] INV[facility] CV [facility];  Service: Cardiovascular;  Laterality: N/A;  . limbar back surgery    . PENILE PROSTHESIS IMPLANT     Family History  Problem Relation Age of Onset  . Hypertension Mother    Social History   Tobacco Use  . Smoking status: Former Smoker    Packs/day: 0.50    Years: 50.00    Pack years: 25.00    Types: Cigarettes  . Smokeless tobacco: Current User  Substance Use Topics  . Alcohol use: Yes    Alcohol/week: 0.0 standard drinks   Allergies  Allergen Reactions  . Lactose Intolerance (Gi)   . Sitagliptin Rash and Other (See Comments)    Januvia     Review of Systems  Constitutional: Negative for appetite change and fatigue.  HENT: Negative for congestion, postnasal drip and sore throat.   Eyes: Negative.   Respiratory: Positive for shortness of breath (with moderate exertion). Negative for cough and chest tightness.   Cardiovascular: Positive for leg swelling. Negative for chest pain and palpitations.  Gastrointestinal: Negative for abdominal distention and abdominal pain.  Endocrine:  Negative.   Genitourinary: Negative.   Musculoskeletal: Negative for back pain and neck pain.  Skin: Positive for wound (left lower leg).  Allergic/Immunologic: Negative.   Neurological: Negative for dizziness and light-headedness.  Hematological: Negative for adenopathy. Bruises/bleeds easily.  Psychiatric/Behavioral: Positive for sleep disturbance. Negative for dysphoric mood. The patient is not nervous/anxious.     Physical Exam Vitals and nursing note reviewed.  Constitutional:      Appearance: Normal appearance.  HENT:     Head: Normocephalic and  atraumatic.  Cardiovascular:     Rate and Rhythm: Bradycardia present. Rhythm irregular.  Pulmonary:     Effort: Pulmonary effort is normal. No respiratory distress.     Breath sounds: No wheezing or rales.     Comments: Diminished RLL Abdominal:     General: There is no distension.     Palpations: Abdomen is soft.  Musculoskeletal:        General: No tenderness.     Cervical back: Normal range of motion and neck supple.     Right lower leg: No edema.     Left lower leg: Edema present.  Skin:    General: Skin is warm and dry.     Comments: Left lower leg wrapped in ACE bandage  Neurological:     General: No focal deficit present.     Mental Status: He is alert and oriented to person, place, and time.  Psychiatric:        Mood and Affect: Mood normal.        Behavior: Behavior normal.        Thought Content: Thought content normal.    Assessment & Plan:  1: Chronic heart failure with reduced ejection fraction- - NYHA class II - euvolemic today - weighing daily and he was instructed to call for an overnight weight gain of >2 pounds or a weekly weight gain of >5 pounds - weight 166.8 pounds from last visit here 6 months ago - admits to liking the taste of salt but is aware of not adding salt to his food; encouraged him to try using Mrs. Dash, pepper or other seasonings that don't contain salt - saw cardiology (Paraschos) 09/11/20 - current BP will not allow changing medication to entresto - BNP 07/30/20 was 1204.0 - reports receiving both COVID vaccines  2: HTN- - BP  - saw PCP (Bronstein) 12/02/20 - BMP 10/17/20 reviewed and showed sodium 137, potassium 4.0, creatinine 1.4 and GFR 49  3: DM- - A1c 11/25/20 was 13.2% - glucose at home this morning was  - saw wound center Larina Bras) 12/22/20 - saw endocrinology Gershon Crane) 11/25/20  4: Recurrent pleural effusion- - saw pulmonology Meredeth Ide) 10/20/20 - wearing oxygen at 2L   Medication list was reviewed.

## 2021-02-03 ENCOUNTER — Telehealth: Payer: Self-pay | Admitting: Family

## 2021-02-03 ENCOUNTER — Ambulatory Visit: Payer: Medicare Other | Admitting: Family

## 2021-02-03 NOTE — Telephone Encounter (Signed)
Patient did not show for his [facility] appointment on 02/03/21. Will attempt to reschedule.

## 2021-02-04 ENCOUNTER — Ambulatory Visit: Payer: Medicare Other | Admitting: Family

## 2021-04-13 ENCOUNTER — Other Ambulatory Visit: Payer: Self-pay | Admitting: Podiatry

## 2022-09-27 ENCOUNTER — Encounter (INDEPENDENT_AMBULATORY_CARE_PROVIDER_SITE_OTHER): Payer: Self-pay
